# Patient Record
Sex: Female | Born: 1970
Health system: Southern US, Community
[De-identification: ages and names within clinical notes are randomized; demographics above are authoritative.]

## PROBLEM LIST (undated history)

## (undated) DIAGNOSIS — A159 Respiratory tuberculosis unspecified: Secondary | ICD-10-CM

## (undated) DIAGNOSIS — F32A Depression, unspecified: Secondary | ICD-10-CM

## (undated) DIAGNOSIS — J45909 Unspecified asthma, uncomplicated: Secondary | ICD-10-CM

## (undated) DIAGNOSIS — F419 Anxiety disorder, unspecified: Secondary | ICD-10-CM

## (undated) DIAGNOSIS — K589 Irritable bowel syndrome without diarrhea: Secondary | ICD-10-CM

## (undated) DIAGNOSIS — S83519A Sprain of anterior cruciate ligament of unspecified knee, initial encounter: Secondary | ICD-10-CM

## (undated) DIAGNOSIS — K760 Fatty (change of) liver, not elsewhere classified: Secondary | ICD-10-CM

## (undated) HISTORY — DX: Respiratory tuberculosis unspecified: A15.9

## (undated) HISTORY — DX: Unspecified asthma, uncomplicated: J45.909

## (undated) HISTORY — PX: COLONOSCOPY: SHX174

## (undated) HISTORY — PX: BREAST SURGERY: SHX581

## (undated) HISTORY — DX: Irritable bowel syndrome, unspecified: K58.9

## (undated) HISTORY — DX: Sprain of anterior cruciate ligament of unspecified knee, initial encounter: S83.519A

## (undated) HISTORY — DX: Depression, unspecified: F32.A

## (undated) HISTORY — PX: OTHER SURGICAL HISTORY: SHX169

## (undated) HISTORY — DX: Fatty (change of) liver, not elsewhere classified: K76.0

---
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
## 2011-06-06 ENCOUNTER — Encounter: Payer: Self-pay | Admitting: Obstetrics & Gynecology

## 2011-06-26 ENCOUNTER — Encounter: Payer: Self-pay | Admitting: Obstetrics & Gynecology

## 2011-06-26 ENCOUNTER — Ambulatory Visit (INDEPENDENT_AMBULATORY_CARE_PROVIDER_SITE_OTHER): Payer: Managed Care, Other (non HMO) | Admitting: Obstetrics & Gynecology

## 2011-06-26 VITALS — BP 103/61 | HR 73 | Temp 98.1°F | Ht 67.5 in | Wt 164.0 lb

## 2011-06-26 DIAGNOSIS — Z0000 Encounter for general adult medical examination without abnormal findings: Secondary | ICD-10-CM

## 2011-06-26 DIAGNOSIS — Z1231 Encounter for screening mammogram for malignant neoplasm of breast: Secondary | ICD-10-CM

## 2011-06-26 DIAGNOSIS — Z113 Encounter for screening for infections with a predominantly sexual mode of transmission: Secondary | ICD-10-CM

## 2011-06-26 DIAGNOSIS — Z1272 Encounter for screening for malignant neoplasm of vagina: Secondary | ICD-10-CM
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
## 2011-06-27 LAB — WET PREP, GENITAL
Trich, Wet Prep: NONE SEEN
Yeast Wet Prep HPF POC: NONE SEEN

## 2011-06-28 ENCOUNTER — Telehealth: Payer: Self-pay | Admitting: *Deleted

## 2011-06-28 DIAGNOSIS — B9689 Other specified bacterial agents as the cause of diseases classified elsewhere: Secondary | ICD-10-CM

## 2011-06-28 DIAGNOSIS — N760 Acute vaginitis: Secondary | ICD-10-CM

## 2011-06-28 MED ORDER — METRONIDAZOLE 500 MG PO TABS: 500.0000 mg | ORAL_TABLET | Freq: Two times a day (BID) | ORAL | Status: AC

## 2011-06-28 NOTE — Telephone Encounter (Signed)
Pt is positive for clue cells on Wet prep and RX for Flagyl 500mg  BID x 7 days sent to [facility] in [place].

## 2011-07-23 ENCOUNTER — Encounter: Payer: Self-pay | Admitting: *Deleted

## 2011-08-03 ENCOUNTER — Encounter: Payer: Self-pay | Admitting: Family Medicine

## 2011-08-03 ENCOUNTER — Ambulatory Visit (INDEPENDENT_AMBULATORY_CARE_PROVIDER_SITE_OTHER): Payer: Managed Care, Other (non HMO) | Admitting: Family Medicine

## 2011-08-03 VITALS — BP 98/58 | HR 58 | Ht 66.0 in | Wt 161.0 lb

## 2011-08-03 DIAGNOSIS — R1011 Right upper quadrant pain: Secondary | ICD-10-CM

## 2011-08-03 DIAGNOSIS — K529 Noninfective gastroenteritis and colitis, unspecified: Secondary | ICD-10-CM

## 2011-08-03 DIAGNOSIS — K5900 Constipation, unspecified: Secondary | ICD-10-CM

## 2011-08-03 DIAGNOSIS — K649 Unspecified hemorrhoids: Secondary | ICD-10-CM | POA: Insufficient documentation

## 2011-08-03 DIAGNOSIS — K5289 Other specified noninfective gastroenteritis and colitis: Secondary | ICD-10-CM

## 2011-08-03 NOTE — Patient Instructions (Signed)
We will call you with your lab results. If you don't here from us in about a week then please give us a call at [phone].  
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
## 2011-08-07 ENCOUNTER — Telehealth: Payer: Self-pay | Admitting: *Deleted

## 2011-08-07 DIAGNOSIS — R1011 Right upper quadrant pain: Secondary | ICD-10-CM

## 2011-08-07 NOTE — Telephone Encounter (Signed)
New Rad order entered

## 2011-08-09 LAB — COMPLETE METABOLIC PANEL WITH GFR
Albumin: 4 g/dL (ref 3.5–5.2)
Alkaline Phosphatase: 33 U/L — ABNORMAL LOW (ref 39–117)
BUN: 9 mg/dL (ref 6–23)
GFR, Est Non African American: >89 mL/min
Glucose, Bld: 93 mg/dL (ref 70–99)
Potassium: 4.1 mEq/L (ref 3.5–5.3)
Total Bilirubin: 0.4 mg/dL (ref 0.3–1.2)

## 2011-08-09 LAB — LIPID PANEL
Cholesterol: 143 mg/dL (ref 0–200)
Total CHOL/HDL Ratio: 3.3 Ratio

## 2011-08-09 LAB — CBC
HCT: 41.8 % (ref 36.0–46.0)
Hemoglobin: 13.4 g/dL (ref 12.0–15.0)
MCHC: 32.1 g/dL (ref 30.0–36.0)
RBC: 4.44 MIL/uL (ref 3.87–5.11)
WBC: 4.6 10*3/uL (ref 4.0–10.5)

## 2011-08-09 NOTE — Progress Notes (Signed)
Quick Note:  All labs are normal. ______ 

## 2011-08-13 ENCOUNTER — Ambulatory Visit: Payer: Managed Care, Other (non HMO)

## 2011-08-15 ENCOUNTER — Ambulatory Visit
Admission: RE | Admit: 2011-08-15 | Discharge: 2011-08-15 | Disposition: A | Discharge status: Home / Self Care | Payer: Managed Care, Other (non HMO) | Source: Ambulatory Visit | Attending: Family Medicine | Admitting: Family Medicine

## 2011-08-15 DIAGNOSIS — R1011 Right upper quadrant pain: Secondary | ICD-10-CM

## 2011-08-16 ENCOUNTER — Ambulatory Visit: Payer: Managed Care, Other (non HMO) | Admitting: Family Medicine

## 2011-08-20 ENCOUNTER — Encounter: Payer: Self-pay | Admitting: Advanced Practice Midwife

## 2011-08-20 ENCOUNTER — Ambulatory Visit (INDEPENDENT_AMBULATORY_CARE_PROVIDER_SITE_OTHER): Payer: Managed Care, Other (non HMO) | Admitting: Advanced Practice Midwife

## 2011-08-20 DIAGNOSIS — N760 Acute vaginitis: Secondary | ICD-10-CM

## 2011-08-20 DIAGNOSIS — N898 Other specified noninflammatory disorders of vagina: Secondary | ICD-10-CM

## 2011-08-20 DIAGNOSIS — B9689 Other specified bacterial agents as the cause of diseases classified elsewhere: Secondary | ICD-10-CM

## 2011-08-20 DIAGNOSIS — N23 Unspecified renal colic: Secondary | ICD-10-CM

## 2011-08-20 DIAGNOSIS — R309 Painful micturition, unspecified: Secondary | ICD-10-CM

## 2011-08-20 DIAGNOSIS — A499 Bacterial infection, unspecified: Secondary | ICD-10-CM

## 2011-08-20 LAB — POCT URINALYSIS DIPSTICK
Leukocytes, UA: NEGATIVE
Nitrite, UA: NEGATIVE
Urobilinogen, UA: NEGATIVE
pH, UA: 7

## 2011-08-20 NOTE — Patient Instructions (Signed)
Report if you are having worsening discharge, odor, itching, burning or pain. We will let you know about your results.

## 2011-08-20 NOTE — Progress Notes (Signed)
  Subjective:    Patient ID: Joanna Baker, female    DOB: 10-Apr-1971, 41 y.o.   MRN: [id number]  HPI Vaginal discharge x2 weeks, no odor,itching,burning - treated 12/13 for BV with Flagyl, about 1 week after treatment had yeast, improved with Monistat 7, then started period. Discharge returned shortly after menses. + mild dysuria - trying hydration/cranberry juice.     Review of Systems  Constitutional: Negative for fever, chills and fatigue.  Respiratory: Negative.   Cardiovascular: Negative.   Gastrointestinal: Negative for nausea, vomiting, abdominal pain, diarrhea and constipation.  Genitourinary: Positive for dysuria and vaginal discharge. Negative for urgency, frequency, hematuria, flank pain, vaginal bleeding, difficulty urinating, genital sores, vaginal pain, menstrual problem, pelvic pain and dyspareunia.  Musculoskeletal: Negative.   Neurological: Negative.   Psychiatric/Behavioral: Negative.        Objective:   Physical Exam  Nursing note and vitals reviewed. Constitutional: She is oriented to person, place, and time. She appears well-developed and well-nourished. No distress.  Cardiovascular: Normal rate.   Pulmonary/Chest: Effort normal.  Abdominal: Soft. There is no tenderness.  Genitourinary: There is no rash, tenderness or lesion on the right labia. There is no rash, tenderness or lesion on the left labia. Cervix exhibits no discharge and no friability. No erythema or bleeding around the vagina. Vaginal discharge (small, white, nonmalodorous) found.  Musculoskeletal: Normal range of motion.  Neurological: She is alert and oriented to person, place, and time.  Skin: Skin is warm and dry.  Psychiatric: She has a normal mood and affect.          Assessment & Plan:  41 y.o. female with vaginal discharge Wet prep collected - no evidence of infection on exam, will treat per wet prep Rev'd normal vaginal discharge vs. Symptoms of infection F/U PRN

## 2011-08-21 ENCOUNTER — Ambulatory Visit
Admission: RE | Admit: 2011-08-21 | Discharge: 2011-08-21 | Disposition: A | Discharge status: Home / Self Care | Payer: Managed Care, Other (non HMO) | Source: Ambulatory Visit | Attending: Obstetrics & Gynecology | Admitting: Obstetrics & Gynecology

## 2011-08-21 DIAGNOSIS — Z1231 Encounter for screening mammogram for malignant neoplasm of breast: Secondary | ICD-10-CM

## 2011-08-21 LAB — WET PREP, GENITAL: Trich, Wet Prep: NONE SEEN

## 2011-08-21 MED ORDER — METRONIDAZOLE 500 MG PO TABS: 500.0000 mg | ORAL_TABLET | Freq: Two times a day (BID) | ORAL | Status: AC

## 2011-08-21 NOTE — Progress Notes (Signed)
Addended by: Archie Patten on: 08/21/2011 09:51 PM   Modules accepted: Orders

## 2011-08-22 ENCOUNTER — Telehealth: Payer: Self-pay | Admitting: *Deleted

## 2011-08-22 NOTE — Telephone Encounter (Signed)
Pt notified of Positive clue cells and that RX was called into [facility] in [place].

## 2011-08-29 ENCOUNTER — Other Ambulatory Visit: Payer: Self-pay | Admitting: Obstetrics & Gynecology

## 2011-08-29 DIAGNOSIS — R928 Other abnormal and inconclusive findings on diagnostic imaging of breast: Secondary | ICD-10-CM

## 2011-09-05 ENCOUNTER — Telehealth: Payer: Self-pay | Admitting: *Deleted

## 2011-09-05 NOTE — Telephone Encounter (Signed)
Pt called to ask questions about ovulation and mittelschmerz.  She states that she is mid cycle and feeling like something cold inside her RT pelvis area.  She was wondering if it was ovulation.  She is noticing the thin cervial mucous.  She does deny any other pain or discomfort.  If she should start to have any pain that does not go away with her period she will call office for appt as this could be an ovarian cyst.

## 2011-09-14 ENCOUNTER — Ambulatory Visit
Admission: RE | Admit: 2011-09-14 | Discharge: 2011-09-14 | Disposition: A | Discharge status: Home / Self Care | Payer: Managed Care, Other (non HMO) | Source: Ambulatory Visit | Attending: Obstetrics & Gynecology | Admitting: Obstetrics & Gynecology

## 2011-09-14 DIAGNOSIS — R928 Other abnormal and inconclusive findings on diagnostic imaging of breast: Secondary | ICD-10-CM

## 2011-11-07 ENCOUNTER — Ambulatory Visit: Payer: Managed Care, Other (non HMO) | Admitting: Family Medicine

## 2011-12-06 ENCOUNTER — Telehealth: Payer: Self-pay

## 2011-12-18 ENCOUNTER — Encounter: Payer: Self-pay | Admitting: Obstetrics & Gynecology

## 2011-12-18 ENCOUNTER — Ambulatory Visit (INDEPENDENT_AMBULATORY_CARE_PROVIDER_SITE_OTHER): Payer: Managed Care, Other (non HMO) | Admitting: Obstetrics & Gynecology

## 2011-12-18 VITALS — BP 97/61 | HR 89 | Temp 98.5°F | Resp 16 | Ht 67.0 in | Wt 159.0 lb

## 2011-12-18 DIAGNOSIS — N920 Excessive and frequent menstruation with regular cycle: Secondary | ICD-10-CM

## 2011-12-18 DIAGNOSIS — N921 Excessive and frequent menstruation with irregular cycle: Secondary | ICD-10-CM

## 2011-12-18 NOTE — Progress Notes (Signed)
  Subjective:    Patient ID: Joanna Baker, female    DOB: [birth year], 41 y.o.   MRN: [id number]  HPI  Joanna Baker is here because her cycles have shortened this year from 26 days to 20 days with heavy periods and many mood swings.  Review of Systems     Objective:   Physical Exam        Assessment & Plan:  Menometrorrhagia- check TSH.  If TSH is normal, we have discussed treatment options- watchful waiting, Mirena, OCPS, endometrial ablation.

## 2011-12-19 LAB — TSH: TSH: 1.155 u[IU]/mL (ref 0.350–4.500)

## 2012-03-18 NOTE — Telephone Encounter (Signed)
Lora called patient.

## 2012-05-09 ENCOUNTER — Other Ambulatory Visit: Payer: Self-pay | Admitting: Obstetrics & Gynecology

## 2012-05-09 DIAGNOSIS — R921 Mammographic calcification found on diagnostic imaging of breast: Secondary | ICD-10-CM

## 2012-05-19 ENCOUNTER — Ambulatory Visit
Admission: RE | Admit: 2012-05-19 | Discharge: 2012-05-19 | Disposition: A | Discharge status: Home / Self Care | Payer: Managed Care, Other (non HMO) | Source: Ambulatory Visit | Attending: Obstetrics & Gynecology | Admitting: Obstetrics & Gynecology

## 2012-05-19 DIAGNOSIS — R921 Mammographic calcification found on diagnostic imaging of breast: Secondary | ICD-10-CM

## 2012-06-03 ENCOUNTER — Encounter: Payer: Self-pay | Admitting: Family Medicine

## 2012-06-03 ENCOUNTER — Ambulatory Visit (INDEPENDENT_AMBULATORY_CARE_PROVIDER_SITE_OTHER): Payer: Managed Care, Other (non HMO) | Admitting: Family Medicine

## 2012-06-03 VITALS — BP 107/64 | HR 76 | Temp 98.2°F | Resp 12 | Wt 159.0 lb

## 2012-06-03 DIAGNOSIS — J209 Acute bronchitis, unspecified: Secondary | ICD-10-CM

## 2012-06-03 MED ORDER — DOXYCYCLINE HYCLATE 100 MG PO TABS: ORAL_TABLET | ORAL | Status: AC

## 2012-06-03 NOTE — Progress Notes (Signed)
CC: Joanna Baker is a 41 y.o. female is here for Cough   Subjective: HPI:  Patient reports 3 days productive cough, brown sputum without blood nor rest color, facial pressure below the eyes, and fatigue. Seems to be getting worse in a daily basis. Interventions include Mucinex without much improvement, nothing particular seems to make it worse. She denies fevers, chills, shortness of breath, wheezing, chest pain, back pain, abdominal pain, nor orthopnea. She is a past medical history of a positive PPD requiring isoniazid, she denies unintentional weight loss nor night sweats recently nor remotely. She is a current smoker   Review Of Systems Outlined In HPI  Past Medical History  Diagnosis Date  . IBS (irritable bowel syndrome)   . TB (tuberculosis), treated     6 month of INH at age 55  . ACL injury tear     Right knee, not repaired.    . Fatty liver      Family History  Problem Relation Age of Onset  . Pulmonary embolism Mother   . Stroke Mother 34  . Alcohol abuse Father   . Depression Paternal Grandfather   . Heart attack Maternal Grandmother      History  Substance Use Topics  . Smoking status: Current Every Day Smoker -- 0.2 packs/day for 5 years    Types: Cigarettes  . Smokeless tobacco: Never Used  . Alcohol Use: 1.2 oz/week    2 Cans of beer per week     Objective: Filed Vitals:   06/03/12 1353  BP: 107/64  Pulse: 76  Temp: 98.2 F (36.8 C)  Resp: 12    General: Alert and Oriented, No Acute Distress HEENT: Pupils equal, round, reactive to light. Conjunctivae clear.  External ears unremarkable, canals clear with intact TMs with appropriate landmarks.  Middle ear appears to have a mild serous effusion. Pink inferior turbinates.  Moist mucous membranes, pharynx without inflammation nor lesions.  Neck supple without palpable lymphadenopathy nor abnormal masses. Lungs: Good air movement, mild central rhonchi without wheezing nor rails nor signs of consolidation    Cardiac: Regular rate and rhythm. Normal S1/S2.  No murmurs, rubs, nor gallops.    Assessment & Plan: Joanna Baker was seen today for cough.  Diagnoses and associated orders for this visit:  Acute bronchitis - doxycycline (VIBRA-TABS) 100 MG tablet; One by mouth twice a day for ten days.   Given her smoking status I like her to start doxycycline and continue Mucinex products with fluids and nasal saline washes. Encouraged her to stop smoking for her general health.Signs and symptoms requring emergent/urgent reevaluation were discussed with the patient.  Return if symptoms worsen or fail to improve.

## 2012-07-25 ENCOUNTER — Emergency Department (HOSPITAL_COMMUNITY)
Admission: EM | Admit: 2012-07-25 | Discharge: 2012-07-25 | Disposition: A | Discharge status: Home / Self Care | Payer: Managed Care, Other (non HMO) | Attending: Emergency Medicine | Admitting: Emergency Medicine

## 2012-07-25 ENCOUNTER — Encounter (HOSPITAL_COMMUNITY): Payer: Self-pay | Admitting: Family Medicine

## 2012-07-25 DIAGNOSIS — F17200 Nicotine dependence, unspecified, uncomplicated: Secondary | ICD-10-CM | POA: Insufficient documentation

## 2012-07-25 DIAGNOSIS — T50Z95A Adverse effect of other vaccines and biological substances, initial encounter: Secondary | ICD-10-CM | POA: Insufficient documentation

## 2012-07-25 DIAGNOSIS — Z9089 Acquired absence of other organs: Secondary | ICD-10-CM | POA: Insufficient documentation

## 2012-07-25 DIAGNOSIS — R0602 Shortness of breath: Secondary | ICD-10-CM | POA: Insufficient documentation

## 2012-07-25 DIAGNOSIS — T8052XA Anaphylactic reaction due to vaccination, initial encounter: Secondary | ICD-10-CM | POA: Insufficient documentation

## 2012-07-25 DIAGNOSIS — Z8739 Personal history of other diseases of the musculoskeletal system and connective tissue: Secondary | ICD-10-CM | POA: Insufficient documentation

## 2012-07-25 DIAGNOSIS — Z8611 Personal history of tuberculosis: Secondary | ICD-10-CM | POA: Insufficient documentation

## 2012-07-25 DIAGNOSIS — Z8719 Personal history of other diseases of the digestive system: Secondary | ICD-10-CM | POA: Insufficient documentation

## 2012-07-25 MED ORDER — SODIUM CHLORIDE 0.9 % IV BOLUS (SEPSIS): 1000.0000 mL | Freq: Once | INTRAVENOUS | Status: DC

## 2012-07-25 MED ORDER — PREDNISONE 20 MG PO TABS
60.0000 mg | ORAL_TABLET | Freq: Once | ORAL | Status: AC
  Administered 2012-07-25: 60 mg via ORAL
  Filled 2012-07-25: qty 3

## 2012-07-25 MED ORDER — DIPHENHYDRAMINE HCL 25 MG PO CAPS: 25.0000 mg | ORAL_CAPSULE | Freq: Once | ORAL | Status: DC

## 2012-07-25 MED ORDER — FAMOTIDINE 20 MG PO TABS
20.0000 mg | ORAL_TABLET | Freq: Once | ORAL | Status: AC
  Administered 2012-07-25: 20 mg via ORAL
  Filled 2012-07-25: qty 1
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
## 2012-07-25 NOTE — ED Notes (Signed)
Got a flu shot today, roommate had flu like symptoms today. States was driving today and had an "anaphylactic type rxn and had to push it to get back to town. Also, my genitals felt very sensitive, today while driving." states had an anaphylactic rxn to iodine based supplement a few years ago,

## 2012-07-25 NOTE — ED Notes (Addendum)
Per pt she had a flu shot this morning and then this afternoon about 3 pm she started having an allergic reaction. sts she is SOB, no distress at triage. Pt sat down on the floor while she was checking in. sts she drove herself here. sts her mother is allergic to the flu shot. sts she feels like she is going to pass out. No facial swelling, throat swelling or rash noted. sts also her genital are highly sensitive.

## 2012-07-27 NOTE — ED Provider Notes (Signed)
Medical screening examination/treatment/procedure(s) were performed by non-physician practitioner and as supervising physician I was immediately available for consultation/collaboration.   Suzi Roots, MD 07/27/12 1524

## 2012-08-01 ENCOUNTER — Ambulatory Visit (INDEPENDENT_AMBULATORY_CARE_PROVIDER_SITE_OTHER): Payer: Managed Care, Other (non HMO) | Admitting: Family Medicine

## 2012-08-01 ENCOUNTER — Encounter: Payer: Self-pay | Admitting: Family Medicine

## 2012-08-01 VITALS — BP 107/57 | HR 86 | Resp 18 | Wt 161.0 lb

## 2012-08-01 DIAGNOSIS — T50995A Adverse effect of other drugs, medicaments and biological substances, initial encounter: Secondary | ICD-10-CM

## 2012-08-01 DIAGNOSIS — M25562 Pain in left knee: Secondary | ICD-10-CM

## 2012-08-01 DIAGNOSIS — T50A95A Adverse effect of other bacterial vaccines, initial encounter: Secondary | ICD-10-CM

## 2012-08-01 DIAGNOSIS — K589 Irritable bowel syndrome without diarrhea: Secondary | ICD-10-CM

## 2012-08-01 DIAGNOSIS — T50B95A Adverse effect of other viral vaccines, initial encounter: Secondary | ICD-10-CM

## 2012-08-01 DIAGNOSIS — M25569 Pain in unspecified knee: Secondary | ICD-10-CM

## 2012-08-01 DIAGNOSIS — K5909 Other constipation: Secondary | ICD-10-CM

## 2012-08-01 NOTE — Progress Notes (Signed)
Subjective:    Patient ID: Joanna Baker, female    DOB: 1971-06-13, 42 y.o.   MRN: [id number]  HPI  Had a  FLU shot and had an anaphylactic reaction. Got flu shot around 10AM. Then aroud 330PM got SOB, palpitations, wheezing, and extreme fatigue. Pulled off at rest stop.  Then drove herself to [place]. Had only had one flu shot prior to this.  Send home with steroid and benadryl.  Felt better into the next day.    Dx with IBS 2 years ago with constipation predominant.  Says when ate cheese she would feel like her bowels in her RIght lower quadrant would explode. Then causes the tissue adn muscle in her right abd to hurt.  Has been off cheese and dairy for sometime. Says did have something with cream in it about a week ago and that cuased problems.  She has had 4 BMs in the last 2 days.  Using fiber once a day.    She does notice she feels worse when she eats things like pizza which are heavy with gluten. She definitely feels she is lactose intolerant and has felt much better since she has cut that out of her diet.  Left knee pain x 8 weeks she says she fell on it at work. She landed directly on her knee. She said there was some mild swelling initially but has not been swelling since then. She says she's unable to kneel and put direct pressure on the knee but otherwise denies any giving out, locking cracking or popping. She has a history of anterior cruciate ligament repair on her right knee. Review of Systems     Objective:   Physical Exam  Constitutional: She is oriented to person, place, and time. She appears well-developed and well-nourished.  HENT:  Head: Normocephalic and atraumatic.  Cardiovascular: Normal rate, regular rhythm and normal heart sounds.   Pulmonary/Chest: Effort normal and breath sounds normal.  Musculoskeletal:       Left knee with no swelling. Normal range of motion. No crepitus. Nontender along the joint lines. She is mildly tender over the right upper anterior tibial  plateau. No actual swelling or palpable knot or lesion. Negative McMurray's. Normal anterior drawer test  Neurological: She is alert and oriented to person, place, and time.  Skin: Skin is warm and dry.  Psychiatric: She has a normal mood and affect. Her behavior is normal.          Assessment & Plan:  Reaction to the flu vaccine. This was added to her allergy list. I do not think she needs any other testing. She has no prior history of allergies and in fact he takes without any difficulty.  Irritable bowel syndrome, constipation predominant-next. We discussed options. Currently she is taking a fiber pill daily. She may need to increase this or consider switching to MiraLax. I also discussed that there are 2 new medications on the market, Amitiza and Linzess, both of which have had great success with so far. She asked that I write a names of these products sounds that she can investigate them further. We also discussed that we could do some gluten intolerance testing. The blood test can be helpful but is not the most sensitive test. She wants to check on the pricing first before she has it done. We also discussed that she could certainly on her own try a gluten-free diet for the next month and see if it improves her symptoms.  Left knee pain-most likely  a bone contusion. No evidence of fracture on exam today but since her pain has persisted for 8 weeks we will go ahead and get an x-ray today. Hopefully it will be normal. Call with results once they're available.

## 2012-08-01 NOTE — Patient Instructions (Addendum)
Recommend start miralax once a day  Call if your pain is not better Consider Linzess an Amitiza.   Can call to see how much a celiac panel.

## 2012-08-03 ENCOUNTER — Encounter: Payer: Self-pay | Admitting: Family Medicine

## 2012-08-22 ENCOUNTER — Ambulatory Visit (HOSPITAL_BASED_OUTPATIENT_CLINIC_OR_DEPARTMENT_OTHER)
Admission: RE | Admit: 2012-08-22 | Discharge: 2012-08-22 | Disposition: A | Discharge status: Home / Self Care | Payer: Managed Care, Other (non HMO) | Source: Ambulatory Visit | Attending: Family Medicine | Admitting: Family Medicine

## 2012-08-22 DIAGNOSIS — M25569 Pain in unspecified knee: Secondary | ICD-10-CM | POA: Insufficient documentation

## 2012-08-22 DIAGNOSIS — M25562 Pain in left knee: Secondary | ICD-10-CM

## 2012-10-21 ENCOUNTER — Ambulatory Visit (INDEPENDENT_AMBULATORY_CARE_PROVIDER_SITE_OTHER): Payer: Managed Care, Other (non HMO) | Admitting: Obstetrics & Gynecology

## 2012-10-21 ENCOUNTER — Other Ambulatory Visit: Payer: Self-pay | Admitting: Obstetrics & Gynecology

## 2012-10-21 ENCOUNTER — Encounter: Payer: Self-pay | Admitting: Obstetrics & Gynecology

## 2012-10-21 VITALS — BP 105/65 | HR 84 | Temp 97.8°F | Resp 22

## 2012-10-21 DIAGNOSIS — N943 Premenstrual tension syndrome: Secondary | ICD-10-CM

## 2012-10-21 DIAGNOSIS — R920 Mammographic microcalcification found on diagnostic imaging of breast: Secondary | ICD-10-CM

## 2012-10-21 MED ORDER — FLUOXETINE HCL 20 MG PO TABS: 20.0000 mg | ORAL_TABLET | Freq: Every day | ORAL | Status: DC

## 2012-10-21 NOTE — Progress Notes (Signed)
  Subjective:    Patient ID: Joanna Baker, female    DOB: Jul 05, 1971, 42 y.o.   MRN: [id number]  HPI  42 yo SW G0 who is here to discuss her PMS type symptoms. She describes intense anger, especially at work. She denies HI and SI. She tells me that she was given a diagnosis of bipolar at age 42 and took lithium at that time but no psych meds in 20 years. She sees a relationship counselor with her girlfriend but does not see a psychiatrist for many years. She reports that about 3 days after her period ends she will have about a week of a decrease level of anger but that it starts to increase about a week before her period. She took Chantix in the past and reports depression with SI.   Review of Systems  Her mammogram and period are due.    Objective:   Physical Exam  Tearful young lady I have deferred her exam today because she is on her period      Assessment & Plan:  PMS versus other psychiatric diagnosis- I have offered her a trial of SSRI versus immediate referral to a psychiatrist. She wants to try the SSRI. Per her request, I am ordering hormone levels. Preventative care- RTC 3 weeks for annual and have mammogram in the meantime.

## 2012-10-22 LAB — TESTOSTERONE, FREE, TOTAL, SHBG: Testosterone, Free: 4.6 pg/mL (ref 0.6–6.8)

## 2012-11-06 ENCOUNTER — Ambulatory Visit (INDEPENDENT_AMBULATORY_CARE_PROVIDER_SITE_OTHER): Payer: Medicare HMO | Admitting: Obstetrics & Gynecology

## 2012-11-06 ENCOUNTER — Ambulatory Visit
Admission: RE | Admit: 2012-11-06 | Discharge: 2012-11-06 | Disposition: A | Discharge status: Home / Self Care | Payer: Medicare HMO | Source: Ambulatory Visit | Attending: Obstetrics & Gynecology | Admitting: Obstetrics & Gynecology

## 2012-11-06 ENCOUNTER — Encounter: Payer: Self-pay | Admitting: Obstetrics & Gynecology

## 2012-11-06 VITALS — BP 97/60 | HR 84 | Resp 14 | Ht 67.0 in | Wt 158.0 lb

## 2012-11-06 DIAGNOSIS — Z1151 Encounter for screening for human papillomavirus (HPV): Secondary | ICD-10-CM

## 2012-11-06 DIAGNOSIS — Z01419 Encounter for gynecological examination (general) (routine) without abnormal findings: Secondary | ICD-10-CM

## 2012-11-06 DIAGNOSIS — Z0000 Encounter for general adult medical examination without abnormal findings: Secondary | ICD-10-CM

## 2012-11-06 DIAGNOSIS — Z124 Encounter for screening for malignant neoplasm of cervix: Secondary | ICD-10-CM

## 2012-11-06 DIAGNOSIS — R920 Mammographic microcalcification found on diagnostic imaging of breast: Secondary | ICD-10-CM

## 2012-11-06 NOTE — Progress Notes (Signed)
Subjective:    Joanna Baker is a 42 y.o. female who presents for an annual exam. The patient has no complaints today. She has been taking only 5 mg of the fluoxatine and has noticed a small improvement in her anger level. She wants to continue to try this treatment for now. She knows that if this is not a successful treatment, then I will refer her to a psychiatrist.. The patient is sexually active. (female partner) GYN screening history: last pap: was normal. The patient wears seatbelts: yes. The patient participates in regular exercise: yes. Has the patient ever been transfused or tattooed?: yes. The patient reports that there is not domestic violence in her life.   Menstrual History: OB History   Grav Para Term Preterm Abortions TAB SAB Ect Mult Living                  Menarche age: 85  Patient's last menstrual period was 10/17/2012.    The following portions of the patient's history were reviewed and updated as appropriate: allergies, current medications, past family history, past medical history, past social history, past surgical history and problem list.  Review of Systems A comprehensive review of systems was negative.  Her mammogram this month was normal. Her lab work this month was normal.   Objective:    BP 97/60  Pulse 84  Resp 14  Ht 5\' 7"  (1.702 m)  Wt 158 lb (71.668 kg)  BMI 24.74 kg/m2  LMP 10/17/2012  General Appearance:    Alert, cooperative, no distress, appears stated age  Head:    Normocephalic, without obvious abnormality, atraumatic  Eyes:    PERRL, conjunctiva/corneas clear, EOM's intact, fundi    benign, both eyes  Ears:    Normal TM's and external ear canals, both ears  Nose:   Nares normal, septum midline, mucosa normal, no drainage    or sinus tenderness  Throat:   Lips, mucosa, and tongue normal; teeth and gums normal  Neck:   Supple, symmetrical, trachea midline, no adenopathy;    thyroid:  no enlargement/tenderness/nodules; no carotid   bruit or  JVD  Back:     Symmetric, no curvature, ROM normal, no CVA tenderness  Lungs:     Clear to auscultation bilaterally, respirations unlabored  Chest Wall:    No tenderness or deformity   Heart:    Regular rate and rhythm, S1 and S2 normal, no murmur, rub   or gallop  Breast Exam:    No tenderness, masses, or nipple abnormality  Abdomen:     Soft, non-tender, bowel sounds active all four quadrants,    no masses, no organomegaly  Genitalia:    Normal female without lesion, discharge or tenderness, NSSA, NT, mobile, normal adnexal exam     Extremities:   Extremities normal, atraumatic, no cyanosis or edema  Pulses:   2+ and symmetric all extremities  Skin:   Skin color, texture, turgor normal, no rashes or lesions  Lymph nodes:   Cervical, supraclavicular, and axillary nodes normal  Neurologic:   CNII-XII intact, normal strength, sensation and reflexes    throughout  .    Assessment:    Healthy female exam.    Plan:     Thin prep Pap smear.  RTC 4 months to follow up on her depression/anger

## 2012-12-01 ENCOUNTER — Ambulatory Visit (INDEPENDENT_AMBULATORY_CARE_PROVIDER_SITE_OTHER): Payer: Medicare HMO | Admitting: Family Medicine

## 2012-12-01 ENCOUNTER — Ambulatory Visit (INDEPENDENT_AMBULATORY_CARE_PROVIDER_SITE_OTHER): Payer: Medicare HMO

## 2012-12-01 ENCOUNTER — Encounter: Payer: Self-pay | Admitting: Family Medicine

## 2012-12-01 VITALS — BP 105/68 | HR 88 | Wt 166.0 lb

## 2012-12-01 DIAGNOSIS — R0789 Other chest pain: Secondary | ICD-10-CM

## 2012-12-01 DIAGNOSIS — R059 Cough, unspecified: Secondary | ICD-10-CM

## 2012-12-01 DIAGNOSIS — R05 Cough: Secondary | ICD-10-CM

## 2012-12-01 NOTE — Progress Notes (Signed)
  Subjective:    Patient ID: Joanna Baker, female    DOB: 28-Aug-1970, 42 y.o.   MRN: [id number]  HPI Some mild chest dicomfort with green sputum and cough x 2 weeks. Worse in the AM and then feels ok during the day. More dry cough during the day.  Stayed at a friends house who had a down comforter.  Still smoking some. She is using e-cig.  Has been more SOB.  Last chest Xray was  5 years. Had some wheezing 2 weeks ago.  No asthma.  Ears have been itchey. Tried some plain sudafed.  No facial pain or pressure.    She's also had some ongoing bowel problems and has really increased her fiber intake it has helped. She's also had some ongoing low back and thoracic spine pain.  Review of Systems     Objective:   Physical Exam  Constitutional: She is oriented to person, place, and time. She appears well-developed and well-nourished.  HENT:  Head: Normocephalic and atraumatic.  Right Ear: External ear normal.  Left Ear: External ear normal.  Nose: Nose normal.  Mouth/Throat: Oropharynx is clear and moist.  TMs and canals are clear.   Eyes: Conjunctivae and EOM are normal. Pupils are equal, round, and reactive to light.  Neck: Neck supple. No thyromegaly present.  Cardiovascular: Normal rate, regular rhythm and normal heart sounds.   Pulmonary/Chest: Effort normal and breath sounds normal. She has no wheezes.  Lymphadenopathy:    She has no cervical adenopathy.  Neurological: She is alert and oriented to person, place, and time.  Skin: Skin is warm and dry.  Psychiatric: She has a normal mood and affect.          Assessment & Plan:  Cough - I really think that her cough is probably allergy related. His been going on for 2 weeks locally the: Has been very high here. She also noticed it got worse when she slept in a bed that had a down comforter. I would like her to try an over-the-counter Zyrtec or Allegra. She think she has some Zyrtec at home and will try that. Encouraged her to use it at  bedtime. Because of the chest tightness I would also like to get a chest x-ray. No prior history of asthma. We'll call with results once available.

## 2013-01-01 ENCOUNTER — Telehealth: Payer: Self-pay | Admitting: *Deleted

## 2013-01-01 NOTE — Telephone Encounter (Signed)
Pt calls and states that she got bit by a tick around June 2 and area when pulled tick out was red, swollen and rash- put neosporin on it and didn't really think much about it but now she is feeling achy, some headache, area is itching where tick was.  No c/o fever, swelling or chills.  Suggested to pt since has been over 14 days since bite then needs to be seen. Sent to schedule appt. Barry Dienes, LPN

## 2013-01-02 ENCOUNTER — Ambulatory Visit (INDEPENDENT_AMBULATORY_CARE_PROVIDER_SITE_OTHER): Payer: Medicare HMO

## 2013-01-02 ENCOUNTER — Ambulatory Visit (INDEPENDENT_AMBULATORY_CARE_PROVIDER_SITE_OTHER): Payer: Medicare HMO | Admitting: Family Medicine

## 2013-01-02 ENCOUNTER — Encounter: Payer: Self-pay | Admitting: Family Medicine

## 2013-01-02 VITALS — BP 108/72 | HR 75 | Temp 98.2°F | Wt 166.0 lb

## 2013-01-02 DIAGNOSIS — M545 Low back pain, unspecified: Secondary | ICD-10-CM

## 2013-01-02 DIAGNOSIS — M5137 Other intervertebral disc degeneration, lumbosacral region: Secondary | ICD-10-CM

## 2013-01-02 DIAGNOSIS — A6920 Lyme disease, unspecified: Secondary | ICD-10-CM

## 2013-01-02 MED ORDER — DOXYCYCLINE HYCLATE 100 MG PO TABS: ORAL_TABLET | ORAL | Status: AC

## 2013-01-02 NOTE — Progress Notes (Signed)
CC: Joanna Baker is a 42 y.o. female is here for Tick Removal and Back Pain   Subjective: HPI:   patient complains of a tick bite that occurred 2-1/2 weeks ago on the left lower abdomen. There was a round rash surrounding this region but was fluctuating in size for a week. She believes it did have a bull's-eye appearance, it is mildly tender without radiation of pain. It had purulence last week that was resolved with manual compression. Days after the bite she began to experience fatigue, generalized body aches, headache.  Symptoms have not gotten better or worsens onset they are overall mild to moderate in severity. She denies fevers, chills, nausea, motor or sensory disturbances, confusion  Patient complains of left low back pain that is moderate in severity without any radiation component. Has been present for one day. Came on without any inciting event. It is described as sharp on the back of the pelvis. It is worse first thing in the morning and improves throughout the day improves with stretching and hot water.  She denies weakness in the legs, bowel or bladder incontinence, subtle paresthesia.  She reports this pain comes and goes about 5 times a year for the past decade or more. Symptoms improved with salicylates   Review Of Systems Outlined In HPI  Past Medical History  Diagnosis Date  . IBS (irritable bowel syndrome)   . TB (tuberculosis), treated      6 month of INH at age 40  . ACL injury tear     Right knee, not repaired.    . Fatty liver      Family History  Problem Relation Age of Onset  . Pulmonary embolism Mother   . Stroke Mother 59  . Alcohol abuse Father   . Depression Paternal Grandfather   . Heart attack Maternal Grandmother      History  Substance Use Topics  . Smoking status: Current Every Day Smoker -- 0.25 packs/day for 5 years    Types: Cigarettes  . Smokeless tobacco: Never Used  . Alcohol Use: 1.2 oz/week    2 Cans of beer per week      Objective: Filed Vitals:   01/02/13 0913  BP: 108/72  Pulse: 75  Temp: 98.2 F (36.8 C)    General: Alert and Oriented, No Acute Distress HEENT: Pupils equal, round, reactive to light. Conjunctivae clear.  Moist mucous membranes Lungs: Clear to auscultation bilaterally, no wheezing/ronchi/rales.  Comfortable work of breathing. Good air movement. Cardiac: Regular rate and rhythm. Normal S1/S2.  No murmurs, rubs, nor gallops.   Abdomen: Soft nontender Extremities: No peripheral edema.  Strong peripheral pulses.  Right lower extremity negative straight leg raise, negative FADIR unble to perform FABER due to pain, full range of motion and strength throughout. L4 and S1 DTRs two over four bilateral and symmetric Back: Mild reproduction of pain with palpation of L5 spinous process along with right superior SI joint Mental Status: No depression, anxiety, nor agitation. Skin: Warm and dry. 3 mm papule without evidence of infection on the left lower abdomen no skin abnormalities or rashes elsewhere  Assessment & Plan: Joanna Baker was seen today for tick removal and back pain.  Diagnoses and associated orders for this visit:  Lyme disease - doxycycline (VIBRA-TABS) 100 MG tablet; One by mouth twice a day for ten days.  Low back pain - DG Lumbar Spine Complete; Future    Discussed with patient that description of rash and constellation of symptoms today are  suspicious for Lyme disease exposure therefore start doxycycline. Low back pain: Discussed with patient the sounds like SI joint arthritis  Offered x-ray of the SI joint to confirm to evaluate for candidacy for steroid injection, she declines this stating she is more concerned about lumbar fracture, therefore will obtain films to address her agenda today  Return in about 4 weeks (around 01/30/2013).

## 2013-01-13 ENCOUNTER — Encounter: Payer: Self-pay | Admitting: Sports Medicine

## 2013-01-13 ENCOUNTER — Ambulatory Visit (INDEPENDENT_AMBULATORY_CARE_PROVIDER_SITE_OTHER): Payer: Medicare HMO | Admitting: Sports Medicine

## 2013-01-13 VITALS — BP 99/55 | HR 66 | Wt 167.0 lb

## 2013-01-13 DIAGNOSIS — M5416 Radiculopathy, lumbar region: Secondary | ICD-10-CM | POA: Insufficient documentation

## 2013-01-13 DIAGNOSIS — IMO0002 Reserved for concepts with insufficient information to code with codable children: Secondary | ICD-10-CM

## 2013-01-13 MED ORDER — MELOXICAM 15 MG PO TABS: ORAL_TABLET | ORAL | Status: DC

## 2013-01-13 NOTE — Progress Notes (Signed)
   Subjective:    I'm seeing this patient as a consultation for:   Dr. Ivan Anchors  CC: Low back pain  HPI: This is a very pleasant 42 year old female with acute on chronic history of low back pain. She recalls falling onto her bottom in her 11s, she had persistent pain but x-rays were negative at that time. Unfortunately for the past few months she's had pain she localizes bilaterally in her lower lumbar spine, radiating down her right leg, down the posterior aspect of her right thigh, posterior aspect of her right lower leg, but not to the foot. Is worse with Valsalva, and worse with flexion, and sitting in a car for long periods of time. She denies constitutional symptoms, she denies bowel or bladder dysfunction. She has tried an occasional Aleve but that's about it.  Past medical history, Surgical history, Family history not pertinant except as noted below, Social history, Allergies, and medications have been entered into the medical record, reviewed, and no changes needed.   Review of Systems: No headache, visual changes, nausea, vomiting, diarrhea, constipation, dizziness, abdominal pain, skin rash, fevers, chills, night sweats, weight loss, swollen lymph nodes, body aches, joint swelling, muscle aches, chest pain, shortness of breath, mood changes, visual or auditory hallucinations.   Objective:   General: Well Developed, well nourished, and in no acute distress.  Neuro/Psych: Alert and oriented x3, extra-ocular muscles intact, able to move all 4 extremities, sensation grossly intact. Skin: Warm and dry, no rashes noted.  Respiratory: Not using accessory muscles, speaking in full sentences, trachea midline.  Cardiovascular: Pulses palpable, no extremity edema. Abdomen: Does not appear distended. Back Exam:  Inspection: Unremarkable  Motion: Flexion 45 deg, Extension 45 deg, Side Bending to 45 deg bilaterally,  Rotation to 45 deg bilaterally  SLR laying: Negative  XSLR laying: Negative    Palpable tenderness: None. FABER: negative. Sensory change: Gross sensation intact to all lumbar and sacral dermatomes.  Reflexes: 2+ at both patellar tendons, 2+ at achilles tendons, Babinski's downgoing.  Strength at foot  Plantar-flexion: 5/5 Dorsi-flexion: 5/5 Eversion: 5/5 Inversion: 5/5  Leg strength  Quad: 5/5 Hamstring: 5/5 Hip flexor: 5/5 Hip abductors: 5/5  Gait unremarkable.  X-rays reviewed, there is multilevel degenerative disc disease worse at the L4-L5 level but also present at the L3-L4 and L5-S1 levels.  Impression and Recommendations:   This case required medical decision making of moderate complexity.

## 2013-01-13 NOTE — Assessment & Plan Note (Addendum)
Right-sided L5 versus S1. SI joint testing was negative. Formal physical therapy, Mobic. She declines prednisone and muscle relaxers. Unlike to see her back in 4 weeks, consider MRI if no better.

## 2013-02-19 ENCOUNTER — Ambulatory Visit: Payer: Medicare HMO | Admitting: Sports Medicine

## 2013-07-13 ENCOUNTER — Encounter: Payer: Self-pay | Admitting: Physician Assistant

## 2013-07-13 ENCOUNTER — Ambulatory Visit (INDEPENDENT_AMBULATORY_CARE_PROVIDER_SITE_OTHER): Payer: BC Managed Care – PPO

## 2013-07-13 ENCOUNTER — Other Ambulatory Visit: Payer: Self-pay | Admitting: Physician Assistant

## 2013-07-13 ENCOUNTER — Ambulatory Visit (INDEPENDENT_AMBULATORY_CARE_PROVIDER_SITE_OTHER): Payer: Medicare HMO | Admitting: Physician Assistant

## 2013-07-13 VITALS — BP 117/71 | HR 105 | Wt 171.0 lb

## 2013-07-13 DIAGNOSIS — S61451A Open bite of right hand, initial encounter: Secondary | ICD-10-CM

## 2013-07-13 DIAGNOSIS — S61409A Unspecified open wound of unspecified hand, initial encounter: Secondary | ICD-10-CM

## 2013-07-13 DIAGNOSIS — IMO0001 Reserved for inherently not codable concepts without codable children: Secondary | ICD-10-CM

## 2013-07-13 DIAGNOSIS — S60229A Contusion of unspecified hand, initial encounter: Secondary | ICD-10-CM

## 2013-07-13 NOTE — Progress Notes (Signed)
   Subjective:    Patient ID: Joanna Baker, female    DOB: 28-Mar-1971, 42 y.o.   MRN: [id number]  HPI Patient is a 42 year old female who presents to the clinic after By to her right hand on Friday night at approximately 9:30 PM. 4 days ago patient was coming home and found her neighbors cat on her first toe. It was very cold at night and patient decided to take at home. She picked up She approached neighbors to where the cat went crazy and started biting her. Patient had been bitten by a cat before so she just cleaned with hydrogen peroxide and iced her hand. She woke up the next morning and her hand was very swollen and she was in intense pain. She finally went to the ER on Saturday night. She was given a tetanus shot, hydrocodone for pain and started on doxycycline for infection. She has contacted animal control and they confirmed that the cat is up-to-date on his rabies vaccine. Patient is not taking hydrocodone but is concerned with the pain that she is in. She still does not have a lot of range of motion of right hand.   Review of Systems     Objective:   Physical Exam  Constitutional: She appears well-developed and well-nourished.  Musculoskeletal:  Hand grip of right hand is nonexistent. Range of motion of fingers and hand are minimal. No strength found in right wrist or hand. Significant swelling, redness, and warmth on dorsum of hand into right wrist. Well-healed puncture wounds at wrists on lateral side. Sharp and dull sensation to tip of right fingers are intact.          Assessment & Plan:  CAT Bite of right hand- will get x-ray today since imaging was not done in the emergency room. Discuss with patient that swelling is still present and could be affecting range of motion of full pain. Encouraged patient to take hydrocodone if needed for pain. Offered tramadol if she wanted to sit down and pain management. Patient declined both stating she did not want to take pain medication.  Encouraged her to at least take ibuprofen for inflammation, swelling and pain relief. Patient is not actively icing on a regular basis. Encouraged patient to ice the hand regularly. Patient has only been on doxycycline for the last 2 days. Encouraged patient to finish complete therapy of doxycycline. I also discussed with patient how wrapping her hand an Ace bandage might also give her some support. Reassured patient that range of motion and strength was slowly start to come back. If not improving in the next week after finishing antibiotic could consider MRI to make sure no tendons have been injured. Patient is up-to-date on tetanus and confirmed cath had its rabies vaccination.

## 2013-10-30 ENCOUNTER — Encounter: Payer: Self-pay | Admitting: Physician Assistant

## 2013-10-30 ENCOUNTER — Ambulatory Visit (INDEPENDENT_AMBULATORY_CARE_PROVIDER_SITE_OTHER): Payer: 59 | Admitting: Physician Assistant

## 2013-10-30 VITALS — BP 96/56 | HR 81 | Ht 67.0 in | Wt 177.0 lb

## 2013-10-30 DIAGNOSIS — Z79899 Other long term (current) drug therapy: Secondary | ICD-10-CM

## 2013-10-30 DIAGNOSIS — R8290 Unspecified abnormal findings in urine: Secondary | ICD-10-CM

## 2013-10-30 DIAGNOSIS — K7689 Other specified diseases of liver: Secondary | ICD-10-CM

## 2013-10-30 DIAGNOSIS — R1011 Right upper quadrant pain: Secondary | ICD-10-CM

## 2013-10-30 DIAGNOSIS — K760 Fatty (change of) liver, not elsewhere classified: Secondary | ICD-10-CM

## 2013-10-30 DIAGNOSIS — R82998 Other abnormal findings in urine: Secondary | ICD-10-CM

## 2013-10-30 LAB — POCT URINALYSIS DIPSTICK
BILIRUBIN UA: NEGATIVE
Blood, UA: NEGATIVE
Glucose, UA: NEGATIVE
KETONES UA: NEGATIVE
Leukocytes, UA: NEGATIVE
NITRITE UA: NEGATIVE
Protein, UA: NEGATIVE
Spec Grav, UA: 1.02
Urobilinogen, UA: 0.2
pH, UA: 8

## 2013-10-30 MED ORDER — NITROFURANTOIN MONOHYD MACRO 100 MG PO CAPS: 100.0000 mg | ORAL_CAPSULE | Freq: Two times a day (BID) | ORAL | Status: DC

## 2013-10-30 NOTE — Progress Notes (Signed)
Subjective:    Patient ID: Joanna Baker, female    DOB: [birth year], 43 y.o.   MRN: [id number]  HPI Pt presents to the clinic with urine odor for last week. Denies any fever, chills, lower abdominal pain, nausea, vomiting. She has kept the same diet. She did have a vitamin change this week. No vaginal discharge.  She is also having some RUQ pain. Describes as more of a dull ache.  She has been told she has a fatty liver as well as lactose intolerant. She was evaluated by GI about 3 years ago and told her stool get's caught in her upper GI and causes pain some times. . For last 2-3 weeks tenderness has increased. Bowel movements are ok. Seems like she is having full bowel movements since addition of special tea.    Hx of ruq removal of diary, fatty liver. Pressure has increased. Bowel movments ok 2-3 weeks goes better. Full bowel movement. Soft since the tea.    Review of Systems     Objective:   Physical Exam  Constitutional: She is oriented to person, place, and time. She appears well-developed and well-nourished.  HENT:  Head: Normocephalic and atraumatic.  Cardiovascular: Normal rate, regular rhythm and normal heart sounds.   Pulmonary/Chest: Effort normal and breath sounds normal. She has no wheezes.  No CVA tenderness.   Abdominal: Soft. Bowel sounds are normal. She exhibits no distension and no mass. There is no tenderness. There is no rebound and no guarding.  Neurological: She is alert and oriented to person, place, and time.  Psychiatric: She has a normal mood and affect. Her behavior is normal.          Assessment & Plan:  Abnormal urine order- . Marland Kitchen Results for orders placed in visit on 10/30/13  URINE CULTURE      Result Value Ref Range   Colony Count NO GROWTH     Organism ID, Bacteria NO GROWTH    CBC WITH DIFFERENTIAL      Result Value Ref Range   WBC 5.6  4.0 - 10.5 K/uL   RBC 4.57  3.87 - 5.11 MIL/uL   Hemoglobin 13.5  12.0 - 15.0 g/dL   HCT 41.3  36.0 -  46.0 %   MCV 90.4  78.0 - 100.0 fL   MCH 29.5  26.0 - 34.0 pg   MCHC 32.7  30.0 - 36.0 g/dL   RDW 13.6  11.5 - 15.5 %   Platelets 228  150 - 400 K/uL   Neutrophils Relative % 54  43 - 77 %   Neutro Abs 3.0  1.7 - 7.7 K/uL   Lymphocytes Relative 34  12 - 46 %   Lymphs Abs 1.9  0.7 - 4.0 K/uL   Monocytes Relative 10  3 - 12 %   Monocytes Absolute 0.6  0.1 - 1.0 K/uL   Eosinophils Relative 2  0 - 5 %   Eosinophils Absolute 0.1  0.0 - 0.7 K/uL   Basophils Relative 0  0 - 1 %   Basophils Absolute 0.0  0.0 - 0.1 K/uL   Smear Review Criteria for review not met    COMPLETE METABOLIC PANEL WITH GFR      Result Value Ref Range   Sodium 141  135 - 145 mEq/L   Potassium 4.0  3.5 - 5.3 mEq/L   Chloride 103  96 - 112 mEq/L   CO2 30  19 - 32 mEq/L   Glucose, Bld 81  70 - 99 mg/dL   BUN 8  6 - 23 mg/dL   Creat 0.75  0.50 - 1.10 mg/dL   Total Bilirubin 0.3  0.2 - 1.2 mg/dL   Alkaline Phosphatase 34 (*) 39 - 117 U/L   AST 18  0 - 37 U/L   ALT 14  0 - 35 U/L   Total Protein 6.4  6.0 - 8.3 g/dL   Albumin 4.2  3.5 - 5.2 g/dL   Calcium 9.1  8.4 - 10.5 mg/dL   GFR, Est African American >89     GFR, Est Non African American >89    POCT URINALYSIS DIPSTICK      Result Value Ref Range   Color, UA yellow     Clarity, UA cloudy     Glucose, UA neg     Bilirubin, UA neg     Ketones, UA neg     Spec Grav, UA 1.020     Blood, UA neg     pH, UA 8.0     Protein, UA neg     Urobilinogen, UA 0.2     Nitrite, UA neg     Leukocytes, UA Negative      Urine culture was negative as well. I feel like odor is most likely coming from something she is eating or perhaps new vitamin. Continue to monitor if continues or changes let me know.   RUQ tenderness/hx of liver enzymes elevated/fatty liver no signs of acute abdomen today. will check liver enzymes. Pt has hx of this pain due to feces getting trapped in upper GI. Discussed adding miralax at night for next couple of days. If not improving I would follow  up with GI since they have managed and see if any additional imaging is warranted.   Spent 30 minutes with pt and greater than 50 percent of visit spent counseling pt reguarding urine odor and RUQ pain.

## 2013-10-31 LAB — CBC WITH DIFFERENTIAL/PLATELET
BASOS PCT: 0 % (ref 0–1)
Basophils Absolute: 0 10*3/uL (ref 0.0–0.1)
Eosinophils Absolute: 0.1 10*3/uL (ref 0.0–0.7)
Eosinophils Relative: 2 % (ref 0–5)
HEMATOCRIT: 41.3 % (ref 36.0–46.0)
HEMOGLOBIN: 13.5 g/dL (ref 12.0–15.0)
LYMPHS ABS: 1.9 10*3/uL (ref 0.7–4.0)
LYMPHS PCT: 34 % (ref 12–46)
MCH: 29.5 pg (ref 26.0–34.0)
MCHC: 32.7 g/dL (ref 30.0–36.0)
MCV: 90.4 fL (ref 78.0–100.0)
MONO ABS: 0.6 10*3/uL (ref 0.1–1.0)
Monocytes Relative: 10 % (ref 3–12)
NEUTROS ABS: 3 10*3/uL (ref 1.7–7.7)
Neutrophils Relative %: 54 % (ref 43–77)
Platelets: 228 10*3/uL (ref 150–400)
RBC: 4.57 MIL/uL (ref 3.87–5.11)
RDW: 13.6 % (ref 11.5–15.5)
WBC: 5.6 10*3/uL (ref 4.0–10.5)

## 2013-10-31 LAB — COMPLETE METABOLIC PANEL WITH GFR
ALK PHOS: 34 U/L — AB (ref 39–117)
ALT: 14 U/L (ref 0–35)
AST: 18 U/L (ref 0–37)
Albumin: 4.2 g/dL (ref 3.5–5.2)
BUN: 8 mg/dL (ref 6–23)
CALCIUM: 9.1 mg/dL (ref 8.4–10.5)
CHLORIDE: 103 meq/L (ref 96–112)
CO2: 30 mEq/L (ref 19–32)
Creat: 0.75 mg/dL (ref 0.50–1.10)
GFR, Est African American: >89 mL/min
GFR, Est Non African American: >89 mL/min
GLUCOSE: 81 mg/dL (ref 70–99)
Potassium: 4 mEq/L (ref 3.5–5.3)
SODIUM: 141 meq/L (ref 135–145)
TOTAL PROTEIN: 6.4 g/dL (ref 6.0–8.3)
Total Bilirubin: 0.3 mg/dL (ref 0.2–1.2)

## 2013-11-01 LAB — URINE CULTURE
Colony Count: NO GROWTH
Organism ID, Bacteria: NO GROWTH

## 2013-11-02 DIAGNOSIS — K760 Fatty (change of) liver, not elsewhere classified: Secondary | ICD-10-CM | POA: Insufficient documentation

## 2013-12-11 ENCOUNTER — Ambulatory Visit (INDEPENDENT_AMBULATORY_CARE_PROVIDER_SITE_OTHER): Payer: 59 | Admitting: Physician Assistant

## 2013-12-11 ENCOUNTER — Encounter: Payer: Self-pay | Admitting: Physician Assistant

## 2013-12-11 VITALS — BP 100/52 | HR 82 | Ht 67.0 in | Wt 178.0 lb

## 2013-12-11 DIAGNOSIS — I10 Essential (primary) hypertension: Secondary | ICD-10-CM

## 2013-12-11 DIAGNOSIS — Z1239 Encounter for other screening for malignant neoplasm of breast: Secondary | ICD-10-CM

## 2013-12-11 DIAGNOSIS — F329 Major depressive disorder, single episode, unspecified: Secondary | ICD-10-CM | POA: Insufficient documentation

## 2013-12-11 DIAGNOSIS — F32A Depression, unspecified: Secondary | ICD-10-CM | POA: Insufficient documentation

## 2013-12-11 DIAGNOSIS — F3289 Other specified depressive episodes: Secondary | ICD-10-CM

## 2013-12-11 DIAGNOSIS — N644 Mastodynia: Secondary | ICD-10-CM

## 2013-12-11 DIAGNOSIS — IMO0002 Reserved for concepts with insufficient information to code with codable children: Secondary | ICD-10-CM

## 2013-12-11 DIAGNOSIS — N62 Hypertrophy of breast: Secondary | ICD-10-CM

## 2013-12-11 DIAGNOSIS — T50995A Adverse effect of other drugs, medicaments and biological substances, initial encounter: Secondary | ICD-10-CM

## 2013-12-11 DIAGNOSIS — T7840XA Allergy, unspecified, initial encounter: Secondary | ICD-10-CM

## 2013-12-11 DIAGNOSIS — F411 Generalized anxiety disorder: Secondary | ICD-10-CM | POA: Insufficient documentation

## 2013-12-11 MED ORDER — EPINEPHRINE 0.3 MG/0.3ML IJ SOAJ: 0.3000 mg | Freq: Once | INTRAMUSCULAR | Status: DC

## 2013-12-11 NOTE — Progress Notes (Signed)
   Subjective:    Patient ID: Joanna Baker, female    DOB: 07-31-1970, 43 y.o.   MRN: [id number]  HPI Patient is a 43 year old female who presents to the clinic to followup after allergic reaction. On 11/25/13 patient took one of her partners Suboxone to just see what it felt like. She began to feel like she cannot swallow, her chest felt tight. She did use her EpiPen since pt has hx of anaphaxysis.  she called her partner and they took her to the hospital. She was placed on prednisone and Benadryl. Her symptoms have resolved today.  She also comes to the clinic to discuss her breasts. Pt feels like her breasts are bigger and more tender for the last couple of months. She does feel like about a week and a half to 2 weeks before her period they are the biggest and most tender. She denies any palpable lump. Patient denies any nipple discharge.  She is due for her mammogram.     Review of Systems  All other systems reviewed and are negative.      Objective:   Physical Exam  Constitutional: She is oriented to person, place, and time. She appears well-developed and well-nourished.  HENT:  Head: Normocephalic and atraumatic.  Cardiovascular: Normal rate, regular rhythm and normal heart sounds.   Pulmonary/Chest: Effort normal and breath sounds normal.  Neurological: She is alert and oriented to person, place, and time.  Skin: Skin is dry.  Psychiatric: She has a normal mood and affect. Her behavior is normal.          Assessment & Plan:   allergic reaction to a drug -discuss with patient not to try other peoples medication. She was prescribed another EpiPen to keep on hand for any other visits where she may feel like she could not breathe her going to an anaphylaxis reaction.  Breast tenderness and enlargement-discuss with patient that the symptoms are normal to come and go during the hormonal cycle. Also discussed with patient she has gained 20 pounds in the last year and could  contribute to some breast enlargement. She is due for mammogram and will order today. Do not think there is any suspicious findings at this time. Reassurance was given to the patient.

## 2013-12-11 NOTE — Patient Instructions (Addendum)
Get mammogram.  Start zyrtec/claritin.

## 2013-12-15 ENCOUNTER — Telehealth: Payer: Self-pay

## 2013-12-15 DIAGNOSIS — N644 Mastodynia: Secondary | ICD-10-CM

## 2013-12-15 DIAGNOSIS — Z1239 Encounter for other screening for malignant neoplasm of breast: Secondary | ICD-10-CM

## 2013-12-15 NOTE — Telephone Encounter (Signed)
[facility] called and stated due to the diagnosis of breast tenderness the order should be a bilateral diagnostic mammogram. Is it ok to change order?

## 2013-12-15 NOTE — Telephone Encounter (Signed)
ok 

## 2013-12-16 NOTE — Telephone Encounter (Signed)
Entered referral

## 2014-01-05 ENCOUNTER — Other Ambulatory Visit: Payer: Self-pay | Admitting: *Deleted

## 2014-01-05 ENCOUNTER — Telehealth: Payer: Self-pay | Admitting: *Deleted

## 2014-01-05 NOTE — Telephone Encounter (Signed)
Yes fine

## 2014-01-05 NOTE — Telephone Encounter (Signed)
I called and left a voice mail asking for patient to call back. I stated in the msg that there were several options for smoking cessation such as wellbutrin, chantix or patches. She needs to let us know what she is wanting to do and we will proceed from there.

## 2014-01-05 NOTE — Telephone Encounter (Signed)
Pt called today stating that she had asked for a rx for nicotine patches while she was here at her last visit.  I didn't see them on her med list.  Can we send this in for her? Please advise.

## 2014-01-06 ENCOUNTER — Other Ambulatory Visit: Payer: Self-pay | Admitting: Family Medicine

## 2014-01-06 DIAGNOSIS — N644 Mastodynia: Secondary | ICD-10-CM

## 2014-01-07 ENCOUNTER — Other Ambulatory Visit: Payer: Self-pay | Admitting: Physician Assistant

## 2014-01-07 MED ORDER — NICOTINE 21 MG/24HR TD PT24: 21.0000 mg | MEDICATED_PATCH | Freq: Every day | TRANSDERMAL | Status: DC

## 2014-01-07 NOTE — Telephone Encounter (Signed)
Pt called back stating that she is already on Wellbutrin from another provider & she had tried chantix in the past & it made her feel suicidal. She prefers the patches.

## 2014-01-07 NOTE — Telephone Encounter (Signed)
Left detailed message on vm notifying pt of patch sent to pharm.

## 2014-01-07 NOTE — Telephone Encounter (Signed)
Sent highest dose of patch to start for one month.

## 2014-01-11 ENCOUNTER — Other Ambulatory Visit: Payer: Self-pay | Admitting: *Deleted

## 2014-01-11 ENCOUNTER — Other Ambulatory Visit: Payer: Self-pay | Admitting: Family Medicine

## 2014-01-11 ENCOUNTER — Other Ambulatory Visit: Payer: Self-pay

## 2014-01-11 DIAGNOSIS — N644 Mastodynia: Secondary | ICD-10-CM

## 2014-01-11 MED ORDER — NICOTINE 14 MG/24HR TD PT24: 14.0000 mg | MEDICATED_PATCH | Freq: Every day | TRANSDERMAL | Status: DC

## 2014-01-12 ENCOUNTER — Telehealth: Payer: Self-pay | Admitting: *Deleted

## 2014-01-12 NOTE — Telephone Encounter (Signed)
Nicoderm patch approved through [facility]. Covered until 01/12/15.

## 2014-01-14 ENCOUNTER — Ambulatory Visit
Admission: RE | Admit: 2014-01-14 | Discharge: 2014-01-14 | Disposition: A | Discharge status: Home / Self Care | Payer: 59 | Source: Ambulatory Visit | Attending: Family Medicine | Admitting: Family Medicine

## 2014-01-14 DIAGNOSIS — N644 Mastodynia: Secondary | ICD-10-CM

## 2014-07-05 ENCOUNTER — Encounter: Payer: Self-pay | Admitting: Physician Assistant

## 2014-07-05 ENCOUNTER — Ambulatory Visit (INDEPENDENT_AMBULATORY_CARE_PROVIDER_SITE_OTHER): Payer: 59

## 2014-07-05 ENCOUNTER — Ambulatory Visit (INDEPENDENT_AMBULATORY_CARE_PROVIDER_SITE_OTHER): Payer: 59 | Admitting: Physician Assistant

## 2014-07-05 VITALS — BP 103/59 | HR 80 | Temp 98.1°F | Ht 67.0 in | Wt 173.0 lb

## 2014-07-05 DIAGNOSIS — R05 Cough: Secondary | ICD-10-CM

## 2014-07-05 DIAGNOSIS — R0602 Shortness of breath: Secondary | ICD-10-CM

## 2014-07-05 DIAGNOSIS — R059 Cough, unspecified: Secondary | ICD-10-CM

## 2014-07-05 MED ORDER — PREDNISONE 50 MG PO TABS: ORAL_TABLET | ORAL | Status: DC

## 2014-07-05 MED ORDER — ALBUTEROL SULFATE 108 (90 BASE) MCG/ACT IN AEPB: 2.0000 | INHALATION_SPRAY | RESPIRATORY_TRACT | Status: DC | PRN

## 2014-07-05 NOTE — Patient Instructions (Addendum)
Start zyrtec at bedtime 10mg . Prednisone course.  Will give rescue inhaler to use as needed.

## 2014-07-05 NOTE — Progress Notes (Signed)
   Subjective:    Patient ID: Joanna Baker, female    DOB: Mar 17, 1971, 43 y.o.   MRN: [id number]  HPI  Pt presents to the clinic with cough and SOB for last month. She remembers sanding and dust seemed to get in her nose and lungs. She has not felt right since. She is very weak. She feels like her chest is tight with wheezing. Dry cough started about 24 hours ago. She has had some sick exposures to bonrchitis and pneumonia. She has been taking sudafed and mucinex. She has a headache today. Worked out earlier this week and felt a little winded but able to complete work out.   Review of Systems  All other systems reviewed and are negative.      Objective:   Physical Exam  Constitutional: She is oriented to person, place, and time. She appears well-developed and well-nourished.  HENT:  Head: Normocephalic and atraumatic.  Right Ear: External ear normal.  Left Ear: External ear normal.  Nose: Nose normal.  Mouth/Throat: Oropharynx is clear and moist. No oropharyngeal exudate.  Eyes: Conjunctivae are normal. Right eye exhibits no discharge. Left eye exhibits no discharge.  Neck: Normal range of motion. Neck supple.  Cardiovascular: Normal rate, regular rhythm and normal heart sounds.   Pulmonary/Chest: Effort normal and breath sounds normal. She has no wheezes.  Lymphadenopathy:    She has no cervical adenopathy.  Neurological: She is alert and oriented to person, place, and time.  Skin: Skin is dry.  Psychiatric: She has a normal mood and affect. Her behavior is normal.          Assessment & Plan:  SOB/Cough- peak flows were all in green. Lung exam sounds great today. Pulse ox is 99 percent. Will get CXR. No fever. Will get CBC. Started prednisone for next 5 days. Albuterol inhaler rx given to use if feels SOB to see if will help but unlikely since peak flows great. Add zyrtec 10mg  to bedtime to help block any histamine response.  Follow up as needed.

## 2014-07-06 ENCOUNTER — Telehealth: Payer: Self-pay

## 2014-07-06 ENCOUNTER — Other Ambulatory Visit: Payer: Self-pay | Admitting: Physician Assistant

## 2014-07-06 LAB — CBC WITH DIFFERENTIAL/PLATELET
BASOS PCT: 0 % (ref 0–1)
Basophils Absolute: 0 10*3/uL (ref 0.0–0.1)
EOS ABS: 0.1 10*3/uL (ref 0.0–0.7)
Eosinophils Relative: 1 % (ref 0–5)
HCT: 39.5 % (ref 36.0–46.0)
HEMOGLOBIN: 13.4 g/dL (ref 12.0–15.0)
Lymphocytes Relative: 24 % (ref 12–46)
Lymphs Abs: 1.9 10*3/uL (ref 0.7–4.0)
MCH: 29.6 pg (ref 26.0–34.0)
MCHC: 33.9 g/dL (ref 30.0–36.0)
MCV: 87.2 fL (ref 78.0–100.0)
MPV: 10.7 fL (ref 9.4–12.4)
Monocytes Absolute: 0.7 10*3/uL (ref 0.1–1.0)
Monocytes Relative: 9 % (ref 3–12)
NEUTROS ABS: 5.3 10*3/uL (ref 1.7–7.7)
Neutrophils Relative %: 66 % (ref 43–77)
Platelets: 215 10*3/uL (ref 150–400)
RBC: 4.53 MIL/uL (ref 3.87–5.11)
RDW: 13.3 % (ref 11.5–15.5)
WBC: 8 10*3/uL (ref 4.0–10.5)

## 2014-07-06 MED ORDER — AZITHROMYCIN 250 MG PO TABS: ORAL_TABLET | ORAL | Status: DC

## 2014-07-06 NOTE — Telephone Encounter (Signed)
Patient advised, she will decline the antibiotic at this point. Advised her to call back if symptoms worsen or persist.

## 2014-07-06 NOTE — Telephone Encounter (Signed)
Joanna Baker states today she has body aches green drainage from her nose and productive cough with green sputum.

## 2014-07-06 NOTE — Telephone Encounter (Signed)
Nothing was seen infectious on exam yesterday. Will send antibiotic to try but if still viral may not improve symptoms.

## 2014-07-06 NOTE — Telephone Encounter (Signed)
i did send so can pick up if need to.

## 2014-07-07 ENCOUNTER — Telehealth: Payer: Self-pay | Admitting: *Deleted

## 2014-11-03 ENCOUNTER — Ambulatory Visit: Payer: Self-pay | Admitting: Physician Assistant

## 2014-11-05 ENCOUNTER — Ambulatory Visit (INDEPENDENT_AMBULATORY_CARE_PROVIDER_SITE_OTHER): Payer: Self-pay | Admitting: Physician Assistant

## 2014-11-05 ENCOUNTER — Encounter: Payer: Self-pay | Admitting: Physician Assistant

## 2014-11-05 VITALS — BP 113/64 | HR 81 | Ht 67.0 in | Wt 185.0 lb

## 2014-11-05 DIAGNOSIS — N949 Unspecified condition associated with female genital organs and menstrual cycle: Secondary | ICD-10-CM

## 2014-11-05 DIAGNOSIS — N943 Premenstrual tension syndrome: Secondary | ICD-10-CM

## 2014-11-05 MED ORDER — LORAZEPAM 0.5 MG PO TABS: 0.5000 mg | ORAL_TABLET | Freq: Three times a day (TID) | ORAL | Status: DC | PRN

## 2014-11-05 NOTE — Patient Instructions (Addendum)
Pyridoxine 25-100 once daily.  Discussed appt with GYN.   Premenstrual Syndrome Premenstrual syndrome (PMS) is a condition that consists of physical, emotional, and behavioral symptoms that affect women of childbearing age. PMS occurs 5-14 days before the start of a menstrual period and often recurs in a predictable pattern. The symptoms go away a few days after the menstrual period starts. PMS can interfere in many ways with normal daily activities and can range from mild to severe. When PMS is considered severe, it may be diagnosed as premenstrual dysphoric disorder (PMDD). A small percentage of women are affected by PMS symptoms and an even smaller percentage of those women are affected by PMDD.  CAUSES  The exact cause of PMS is unknown, but it seems to be related to cyclic hormone changes that happen before menstruation. These hormones are thought to affect chemicals in the brain (serotonin) that can influence a person's mood.  SYMPTOMS  Symptoms of PMS recur consistently from month to month and go away completely after the menstrual period starts. The most common emotional or behavioral symptom is mood swings. These mood swings can be disabling and interfere with normal activities of daily living. Other common symptoms include depression and angry outbursts. Other symptoms may include:   Irritability.  Anxiety.  Crying spells.   Food cravings or appetite changes.   Changes in sexual desire.   Confusion.   Aggression.   Social withdrawal.   Poor concentration. The most common physical symptoms include a sense of bloating, breast pain, headaches, and extreme fatigue. Other physical symptoms include:   Backaches.   Swelling of the hands and feet.   Weight gain.   Hot flashes.  DIAGNOSIS  To make a diagnosis, your caregiver will ask questions to confirm that you are having a pattern of symptoms. Symptoms must:   Be present 5 days before the start of your period and  be present at least 3 months in a row.   End within 4 days after your period starts.   Interfere with some of your normal activities.  Other conditions, such as thyroid disease, depression, and migraine headaches must be ruled out before a diagnosis of PMS is confirmed.  TREATMENT  Your caregiver may suggest ways to maintain a healthy lifestyle, such as exercise. Over-the-counter pain relievers may ease cramps, aches, pains, headaches, and breast tenderness. However, selective serotonin reuptake inhibitors (SSRIs) are medicines that are most beneficial in improving PMS if taken in the second half of the monthly cycle. They may be taken on a daily basis. The most effective oral contraceptive pill used for symptoms of PMS is one that contains the ingredient drospirenone. Taking 4 days off of the pill instead of the usual 7 days also has shown to increase effectiveness.  There are a number of drugs, dietary supplements, vitamins, and water pills (diuretics) which have been suggested to be helpful but have not shown to be of any benefit to improving PMS symptoms.  HOME CARE INSTRUCTIONS   For 2-3 months, write down your symptoms, their severity, and how long they last. This may help your caregiver prescribe the best treatment for your symptoms.  Exercise regularly as suggested by your caregiver.  Eat a regular, well-balanced diet.  Avoid caffeine, alcohol, and tobacco consumption.  Limit salt and salty foods to lessen bloating and fluid retention.  Get enough sleep. Practice relaxation techniques.  Drink enough fluids to keep your urine clear or pale yellow.  Take medicines as directed by your caregiver.  Limit stress.  Take a multivitamin as directed by your caregiver. Document Released: 06/29/2000 Document Revised: 03/26/2012 Document Reviewed: 11/19/2011 [facility] Patient Information 2015 [place], [place]. This information is not intended to replace advice given to you by your health  care provider. Make sure you discuss any questions you have with your health care provider.

## 2014-11-07 DIAGNOSIS — N943 Premenstrual tension syndrome: Secondary | ICD-10-CM | POA: Insufficient documentation

## 2014-11-07 NOTE — Progress Notes (Signed)
   Subjective:    Patient ID: Joanna Baker, female    DOB: [birth year], 44 y.o.   MRN: [id number]  HPI Pt presents to the clinic to discuss raging mood changes around cycle. She has logged activity and within 5 days before or after period pt has uncontrollable mood swings. She is easily angered, very aggressive, irritable, down, and just feels like she is going to lose control. She has had symptoms for the couple of years. Tried wellbutrin at one point with no relief. In the past tried prozac which called palpitations. Other medications have caused worsening mood swings. Denies any suicidal or homicidal thoughts. She does not want to take benzos.    Review of Systems  All other systems reviewed and are negative.      Objective:   Physical Exam  Constitutional: She is oriented to person, place, and time. She appears well-developed and well-nourished.  HENT:  Head: Normocephalic and atraumatic.  Cardiovascular: Normal rate, regular rhythm and normal heart sounds.   Pulmonary/Chest: Effort normal and breath sounds normal.  Neurological: She is alert and oriented to person, place, and time.  Psychiatric:  Tearful, frustrated throughout interview.           Assessment & Plan:  Pre-menstrual symptoms-tried and failed one SSRI. Pt is very hesisent to try any other medications. i did give a small quanity of ativan for acute symptoms if only using a couple of times a month consider controlled. Discussed abuse potential. Pt has tried numerous natural agents but did give a dose for B6. Given HO. Encouraged exercise. offerred psych refierral pt declined today. Pt wants to discuss oophorectomy and directed her to make appt with GYN. i am certainly somewhat suspicious of perhaps daily mood changes and anxiety vs only around cycle.

## 2014-12-02 ENCOUNTER — Ambulatory Visit (INDEPENDENT_AMBULATORY_CARE_PROVIDER_SITE_OTHER): Payer: 59 | Admitting: Obstetrics & Gynecology

## 2014-12-02 ENCOUNTER — Encounter: Payer: Self-pay | Admitting: Obstetrics & Gynecology

## 2014-12-02 VITALS — BP 93/55 | HR 80 | Resp 16 | Ht 67.0 in | Wt 182.0 lb

## 2014-12-02 DIAGNOSIS — Z124 Encounter for screening for malignant neoplasm of cervix: Secondary | ICD-10-CM

## 2014-12-02 DIAGNOSIS — Z1151 Encounter for screening for human papillomavirus (HPV): Secondary | ICD-10-CM

## 2014-12-02 DIAGNOSIS — Z01419 Encounter for gynecological examination (general) (routine) without abnormal findings: Secondary | ICD-10-CM

## 2014-12-02 DIAGNOSIS — N943 Premenstrual tension syndrome: Secondary | ICD-10-CM

## 2014-12-02 DIAGNOSIS — Z0000 Encounter for general adult medical examination without abnormal findings: Secondary | ICD-10-CM

## 2014-12-02 MED ORDER — ESCITALOPRAM OXALATE 10 MG PO TABS: ORAL_TABLET | ORAL | Status: DC

## 2014-12-02 NOTE — Progress Notes (Signed)
Subjective:    Joanna Baker is a 44 y.o. SWP0 female who presents for an annual exam. The patient has no complaints today except that she complains of "extreme hormonal surges 7-14 days of her period, and 4 days after her periods." Her periods are monthly and last about 5 days.  The patient is not currently sexually active. GYN screening history: last pap: was normal. The patient wears seatbelts: yes. The patient participates in regular exercise: yes. Has the patient ever been transfused or tattooed?: yes. The patient reports that there is not domestic violence in her life.   Menstrual History: OB History    No data available      Menarche age: 63  Patient's last menstrual period was 11/16/2014.    The following portions of the patient's history were reviewed and updated as appropriate: allergies, current medications, past family history, past medical history, past social history, past surgical history and problem list.  Review of Systems Pertinent items are noted in HPI.  Mammogram UTD, due in the Winter. She has been treated with Wellbutrin in the past but didn't like it.   Objective:    BP 93/55 mmHg  Pulse 80  Resp 16  Ht 5\' 7"  (1.702 m)  Wt 182 lb (82.555 kg)  BMI 28.50 kg/m2  LMP 11/16/2014  General Appearance:    Alert, cooperative, no distress, appears stated age  Head:    Normocephalic, without obvious abnormality, atraumatic  Eyes:    PERRL, conjunctiva/corneas clear, EOM's intact, fundi    benign, both eyes  Ears:    Normal TM's and external ear canals, both ears  Nose:   Nares normal, septum midline, mucosa normal, no drainage    or sinus tenderness  Throat:   Lips, mucosa, and tongue normal; teeth and gums normal  Neck:   Supple, symmetrical, trachea midline, no adenopathy;    thyroid:  no enlargement/tenderness/nodules; no carotid   bruit or JVD  Back:     Symmetric, no curvature, ROM normal, no CVA tenderness  Lungs:     Clear to auscultation bilaterally,  respirations unlabored  Chest Wall:    No tenderness or deformity   Heart:    Regular rate and rhythm, S1 and S2 normal, no murmur, rub   or gallop  Breast Exam:    No tenderness, masses, or nipple abnormality  Abdomen:     Soft, non-tender, bowel sounds active all four quadrants,    no masses, no organomegaly  Genitalia:    Normal female without lesion, discharge or tenderness     Extremities:   Extremities normal, atraumatic, no cyanosis or edema  Pulses:   2+ and symmetric all extremities  Skin:   Skin color, texture, turgor normal, no rashes or lesions  Lymph nodes:   Cervical, supraclavicular, and axillary nodes normal  Neurologic:   CNII-XII intact, normal strength, sensation and reflexes    throughout  .    Assessment:    Healthy female exam.   She believes that she has PMDD and would like treatment.   Plan:     Breast self exam technique reviewed and patient encouraged to perform self-exam monthly. Thin prep Pap smear.   RTC for fasting labs Trial of Lexapro 2 weeks prior to period (She does not want to take it daily)

## 2014-12-06 LAB — CYTOLOGY - PAP

## 2014-12-27 ENCOUNTER — Other Ambulatory Visit: Payer: Self-pay

## 2014-12-27 MED ORDER — LORAZEPAM 0.5 MG PO TABS: 0.5000 mg | ORAL_TABLET | Freq: Three times a day (TID) | ORAL | Status: DC | PRN

## 2015-02-01 ENCOUNTER — Telehealth: Payer: Self-pay

## 2015-02-01 NOTE — Telephone Encounter (Signed)
Patient called and left a message on nurse line asking for a return call.   Returned Call: Left message asking patient to call back.  

## 2015-02-01 NOTE — Telephone Encounter (Signed)
Patient went to [facility] for a rash.

## 2015-02-04 ENCOUNTER — Emergency Department
Admission: EM | Admit: 2015-02-04 | Discharge: 2015-02-04 | Disposition: A | Discharge status: Home / Self Care | Payer: 59 | Source: Home / Self Care | Attending: Family Medicine | Admitting: Family Medicine

## 2015-02-04 ENCOUNTER — Encounter: Payer: Self-pay | Admitting: Emergency Medicine

## 2015-02-04 DIAGNOSIS — L255 Unspecified contact dermatitis due to plants, except food: Secondary | ICD-10-CM | POA: Diagnosis not present

## 2015-02-04 DIAGNOSIS — J029 Acute pharyngitis, unspecified: Secondary | ICD-10-CM

## 2015-02-04 HISTORY — DX: Anxiety disorder, unspecified: F41.9

## 2015-02-04 LAB — POCT RAPID STREP A (OFFICE): Rapid Strep A Screen: NEGATIVE

## 2015-02-04 MED ORDER — TRIAMCINOLONE ACETONIDE 40 MG/ML IJ SUSP
40.0000 mg | Freq: Once | INTRAMUSCULAR | Status: AC
  Administered 2015-02-04: 40 mg via INTRAMUSCULAR

## 2015-02-04 MED ORDER — AZITHROMYCIN 250 MG PO TABS: ORAL_TABLET | ORAL | Status: DC

## 2015-02-04 NOTE — ED Provider Notes (Signed)
CSN: [id number]     Arrival date & time 02/04/15  1618 History   First MD Initiated Contact with Patient 02/04/15 1709     Chief Complaint  Patient presents with  . Rash      HPI Comments: Patient was pulling weeds in her yard five days ago, and the next day she developed a pruritic rash on her arms.  Her rash has improved with application of triamcinolone cream and several OTC treatments. Last night she developed a sore throat with chills, nausea, and abdominal bloating.  No cough or nasal congestion.  She is concerned that the poison ivy may be causing her sore throat.  The history is provided by the patient.    Past Medical History  Diagnosis Date  . IBS (irritable bowel syndrome)   . TB (tuberculosis), treated      6 month of INH at age 44  . ACL injury tear     Right knee, not repaired.    . Fatty liver   . Anxiety    Past Surgical History  Procedure Laterality Date  . Tonsillectomy  1993   Family History  Problem Relation Age of Onset  . Pulmonary embolism Mother   . Stroke Mother 29  . Alcohol abuse Father   . Depression Paternal Grandfather   . Heart attack Maternal Grandmother    History  Substance Use Topics  . Smoking status: Current Every Day Smoker -- 0.25 packs/day for 5 years    Types: Cigarettes  . Smokeless tobacco: Never Used  . Alcohol Use: 1.2 oz/week    2 Cans of beer per week   OB History    No data available     Review of Systems + sore throat No cough No pleuritic pain No wheezing No nasal congestion ? post-nasal drainage No sinus pain/pressure No itchy/red eyes No earache No hemoptysis No SOB No fever, + chills + nausea No vomiting No abdominal pain but bloating present No diarrhea No urinary symptoms No skin rash + fatigue No myalgias No headache    Allergies  Influenza vaccines; Iodine; Chantix; Lobster; Penicillins; Prednisone; Prozac; Suboxone; and Sulfa antibiotics  Home Medications   Prior to Admission medications   Medication Sig Start Date End Date Taking? Authorizing Provider  azithromycin (ZITHROMAX Z-PAK) 250 MG tablet Take 2 tabs today; then begin one tab once daily for 4 more days. (Rx void after 02/12/15) 02/04/15   Kandra Nicolas, MD  Calcium Carbonate-Vitamin D (CALCIUM-VITAMIN D) 500-200 MG-UNIT per tablet Take 1 tablet by mouth daily.      Historical Provider, MD  EPINEPHrine 0.3 mg/0.3 mL IJ SOAJ injection Inject 0.3 mLs (0.3 mg total) into the muscle once. 12/11/13   Jade L Breeback, PA-C  escitalopram (LEXAPRO) 10 MG tablet As directed 12/02/14   Emily Filbert, MD  EVENING PRIMROSE OIL PO Take by mouth daily.    Historical Provider, MD  Kava, Piper methysticum, (KAVA KAVA PO) Take 10 drops by mouth as needed.      Historical Provider, MD  loratadine (CLARITIN) 10 MG tablet Take 10 mg by mouth daily.    Historical Provider, MD  LORazepam (ATIVAN) 0.5 MG tablet Take 1 tablet (0.5 mg total) by mouth every 8 (eight) hours as needed for anxiety. 12/27/14   Hali Marry, MD  MAGNESIUM CARBONATE PO Take by mouth daily.    Historical Provider, MD  Multiple Vitamin (MULTIVITAMIN WITH MINERALS) TABS Take 1 tablet by mouth daily.    Historical Provider, MD  naproxen sodium (ANAPROX) 220 MG tablet Take 220 mg by mouth as  needed.     Historical Provider, MD  nicotine (NICODERM CQ - DOSED IN MG/24 HOURS) 14 mg/24hr patch Place 1 patch (14 mg total) onto the skin daily. 01/11/14   Jade L Breeback, PA-C  pseudoephedrine (SUDAFED) 30 MG tablet Take 30 mg by mouth every 4 (four) hours as needed.    Historical Provider, MD   BP 94/61 mmHg  Pulse 90  Temp(Src) 98.6 F (37 C) (Oral)  Resp 18  Ht 5\' 7"  (1.702 m)  Wt 185 lb (83.915 kg)  BMI 28.97 kg/m2  SpO2 98% Physical  Exam  Constitutional: She is oriented to person, place, and time. She appears well-developed and well-nourished. No distress.  HENT:  Head: Normocephalic.  Nose: Nose normal.  Pharynx is erythematous without exudate.  Eyes: Conjunctivae are normal. Pupils are equal, round, and reactive to light.  Neck: Neck supple.  Tender slightly enlarged anterior nodes are palpated.  Posterior shotty nodes are slightly tender.  Cardiovascular: Normal heart sounds.   Pulmonary/Chest: Breath sounds normal.  Abdominal: There is no tenderness.  Musculoskeletal: She exhibits no edema.  Lymphadenopathy:    She has cervical adenopathy.  Neurological: She is alert and oriented to person, place, and time.  Skin: Skin is warm and dry. Rash noted. Rash is maculopapular.     Bilateral forearms have maculopapular mildy erythematous eruption that appears to be healing.  No vesicles or weeping present.  Nursing note and vitals reviewed.   ED Course  Procedures  None    Labs Reviewed  STREP A DNA PROBE  POCT RAPID STREP A (OFFICE) negative      MDM   1. Rhus dermatitis   2. Acute pharyngitis, unspecified pharyngitis type; suspect early viral URI    Throat culture pending Kenalog 40mg  administered Continue present treatment for poison ivy rash.  If cold symptoms develop, try the following: Take plain guaifenesin (1200mg  extended release tabs such as Mucinex) twice daily, with plenty of water, for cough and congestion.  May add Pseudoephedrine (30mg ,  one or two every 4 to 6 hours) for sinus congestion.  Get adequate rest.   May use Afrin nasal spray (or generic oxymetazoline) twice daily for about 5 days.  Also recommend using saline nasal spray several times daily and saline nasal irrigation (AYR is a common brand).  Use Flonase nasal spray each morning after using Afrin nasal spray and saline nasal irrigation. Try warm salt water gargles for sore throat.  Stop all antihistamines for now, and other non-prescription cough/cold preparations.  May take Delsym Cough Suppressant at bedtime for nighttime cough.   Begin Azithromycin if throat culture positive, if not improving about one week, or if persistent fever develops (Given a prescription to hold, with an expiration date)  Follow-up with family doctor if not improving about 7 to10 days.     Kandra Nicolas, MD 02/06/15 [phone]

## 2015-02-04 NOTE — Discharge Instructions (Signed)
Continue present treatment for poison ivy rash.  If cold symptoms develop, try the following: Take plain guaifenesin (1200mg  extended release tabs such as Mucinex) twice daily, with plenty of water, for cough and congestion.  May add Pseudoephedrine (30mg , one or two every 4 to 6 hours) for sinus congestion.  Get adequate rest.   May use Afrin nasal spray (or generic oxymetazoline) twice daily for about 5 days.  Also recommend using saline nasal spray several times daily and saline nasal irrigation (AYR is a common brand).  Use Flonase nasal spray each morning after using Afrin nasal spray and saline nasal irrigation. Try warm salt water gargles for sore throat.  Stop all antihistamines for now, and other non-prescription cough/cold preparations. May take Delsym Cough Suppressant at bedtime for nighttime cough.   Begin Azithromycin if throat culture positive, if not improving about one week, or if persistent fever develops   Follow-up with family doctor if not improving about 7 to10 days.

## 2015-02-04 NOTE — ED Notes (Signed)
Reports onset of rash on both arms, with scattered other rare spots on neck and foot; 5 days ago.  Today she is concerned because her throat feels strange and she thought she saw blisters at back of mouth. Patient took benadryl 12.5mg  po 3 hours ago. No signs of dyspnea, but she is clearly nervous. Mother is with her for support.

## 2015-02-05 LAB — STREP A DNA PROBE: GASP: NEGATIVE

## 2015-02-07 ENCOUNTER — Telehealth: Payer: Self-pay | Admitting: Emergency Medicine

## 2015-02-07 LAB — HEPATITIS B CORE ANTIBODY, TOTAL: HEP B VIRAL DNA COPIES/ML: NONREACTIVE

## 2015-02-07 LAB — CBC AND DIFFERENTIAL
HEMOGLOBIN: 12.6 g/dL (ref 12.0–16.0)
Platelets: 198 10*3/uL (ref 150–399)
WBC: 5.6 10*3/mL

## 2015-02-07 LAB — HEPATIC FUNCTION PANEL
ALT: 9 U/L (ref 7–35)
AST: 13 U/L (ref 13–35)
Alkaline Phosphatase: 32 U/L (ref 25–125)

## 2015-02-07 LAB — HIV ANTIBODY: HIV: NONREACTIVE

## 2015-02-07 LAB — LIPID PANEL
Cholesterol: 152 mg/dL (ref 0–200)
HDL: 56 mg/dL (ref 35–70)
LDL CALC: 88 mg/dL
Triglycerides: 61 mg/dL (ref 40–160)

## 2015-02-07 LAB — HEPATITIS B E ANTIGEN: HEP B VIRAL DNA IU/ML: NONREACTIVE

## 2015-02-07 LAB — CALCIUM: Calcium: 8.9 mg/dL

## 2015-02-07 LAB — HEMOGLOBIN A1C: Hgb A1c MFr Bld: 4.6 % (ref 4.0–6.0)

## 2015-02-07 LAB — CHLORIDE: Chloride: 107 mmol/L

## 2015-02-07 LAB — BASIC METABOLIC PANEL
CREATININE: 0.6 mg/dL (ref 0.5–1.1)
GLUCOSE: 83 mg/dL
Potassium: 4 mmol/L (ref 3.4–5.3)
SODIUM: 140 mmol/L (ref 137–147)

## 2015-02-07 LAB — HEPATITIS B SURFACE ANTIBODY,QUALITATIVE: Hep B Surface Ab, Qual: NONREACTIVE

## 2015-02-07 LAB — PROTEIN, TOTAL: Protein: 6.4

## 2015-02-07 LAB — HEPATITIS C ANTIBODY: Hep C Virus Ab: REACTIVE

## 2015-02-08 ENCOUNTER — Telehealth: Payer: Self-pay | Admitting: *Deleted

## 2015-02-08 DIAGNOSIS — R768 Other specified abnormal immunological findings in serum: Secondary | ICD-10-CM

## 2015-02-08 NOTE — Telephone Encounter (Signed)
Just an [place] from [place] called today to inform us that while working pt up to be a living donor, they found her Hep C antibodies to be reactive.  Pt is very upset and wants a day to absorb this information.  She will be calling our office to send her to whatever specialist is needed.  I was informed that the pt has never used needles for drugs and hasn't been with a man in the last 4 yrs.  She does have multiple tattoos so maybe that could be the cause.  Jaclyn Shaggy is faxing over all of the pt's results today so we can have them as well.

## 2015-02-08 NOTE — Telephone Encounter (Signed)
Pt notified & is going to have blood drawn this afternoon.

## 2015-02-08 NOTE — Telephone Encounter (Signed)
Received recent labs.  We will need additional blood work too.  I will print labs and she can go at any time.

## 2015-02-09 ENCOUNTER — Encounter: Payer: Self-pay | Admitting: Family Medicine

## 2015-02-09 ENCOUNTER — Ambulatory Visit (INDEPENDENT_AMBULATORY_CARE_PROVIDER_SITE_OTHER): Payer: 59 | Admitting: Family Medicine

## 2015-02-09 VITALS — BP 108/73 | HR 84 | Wt 183.0 lb

## 2015-02-09 DIAGNOSIS — M79604 Pain in right leg: Secondary | ICD-10-CM | POA: Diagnosis not present

## 2015-02-09 LAB — HEPATITIS C ANTIBODY: HCV Ab: NEGATIVE

## 2015-02-09 NOTE — Assessment & Plan Note (Signed)
Likely piriformis syndrome. Tx home exercise program and refer to PT. Continue OTC meds PRN.  Will obtain lumbar x-rays. Additionally will get d-dimer and CBC and metabolic panel to assess for DVT risk. Return in a few weeks

## 2015-02-09 NOTE — Progress Notes (Signed)
Joanna Baker is a 44 y.o. female who presents to [facility]  today for right leg pain. Patient is a two-month history of pain radiating from her right buttocks to her right leg. This is worse with prolonged sitting. No weakness or numbness bowel bladder dysfunction or difficulty walking. She notes occasional leg swelling. Her mother had 3 DVTs and she is concerned about the possibility of a DVT herself. No chest pains or palpitations. She's tried some over-the-counter medicines which helps some. Additionally she notes an occasional throbbing sensation in her right heel.   Past Medical History  Diagnosis Date  . IBS (irritable bowel syndrome)   . TB (tuberculosis), treated      6 month of INH at age 41  . ACL injury tear     Right knee, not repaired.    . Fatty liver   . Anxiety    Past Surgical History  Procedure Laterality Date  . Tonsillectomy  1993   History  Substance Use Topics  . Smoking status: Current Every Day Smoker -- 0.25 packs/day for 5 years    Types: Cigarettes  . Smokeless tobacco: Never Used  . Alcohol Use: 1.2 oz/week    2 Cans of beer per week   ROS as above Medications: Current Outpatient Prescriptions  Medication Sig Dispense Refill  . Calcium Carbonate-Vitamin D (CALCIUM-VITAMIN D) 500-200 MG-UNIT per tablet Take 1 tablet by mouth daily.      Marland Kitchen EPINEPHrine 0.3 mg/0.3 mL IJ SOAJ injection Inject 0.3 mLs (0.3 mg total) into the muscle once. 1 Device 1  . escitalopram (LEXAPRO) 10 MG tablet As directed 30 tablet 6  . EVENING PRIMROSE OIL PO Take by mouth daily.    . Kava, Piper methysticum, (KAVA KAVA PO) Take 10 drops by mouth as needed.      . loratadine (CLARITIN) 10 MG tablet Take 10 mg by mouth daily.    Marland Kitchen LORazepam (ATIVAN) 0.5 MG tablet Take 1 tablet (0.5 mg total) by mouth every 8 (eight) hours as needed for anxiety. 30 tablet 0  . MAGNESIUM CARBONATE PO Take by mouth daily.    . Multiple Vitamin (MULTIVITAMIN WITH MINERALS) TABS Take 1 tablet by mouth daily.     . naproxen sodium (ANAPROX) 220 MG tablet Take 220 mg by mouth as needed.     . nicotine (NICODERM CQ - DOSED IN MG/24 HOURS) 14 mg/24hr patch Place 1 patch (14 mg total) onto the skin daily. 28 patch 0  . pseudoephedrine (SUDAFED) 30 MG tablet Take 30 mg by mouth every 4 (four) hours as needed.     No current facility-administered medications for this visit.   Allergies  Allergen Reactions  . Influenza Vaccines Anaphylaxis  . Iodine Anaphylaxis  . Chantix [Varenicline Tartrate] Other (See Comments)    Mood changes.   Vassie Loll Allergy] Itching  . Penicillins Hives  . Prednisone Other (See Comments)    Mood changes/extreme to the point of wanting to kill herself.   . Prozac [Fluoxetine Hcl]     Palpitations.  . Suboxone [Buprenorphine Hcl-Naloxone Hcl]   . Sulfa Antibiotics Hives     Exam:  BP 108/73 mmHg  Pulse 84  Wt 183 lb (83.008 kg) Gen: Well NAD HEENT: EOMI,  MMM Lungs: Normal work of breathing. CTABL Heart: RRR no MRG Abd: NABS, Soft. Nondistended, Nontender Exts: Brisk capillary refill, warm and well perfused.  Back: Nontender to midline or SI joint.  Normal ROM.  Negative straight leg raise test and  Faber test. Positive pain with figure-4 stretch right. Hip: Normal-appearing normal range of motion. Tender palpation right piriformis area. Reflexes sensation intact bilateral lower extremities. Normal gait. Foot: Normal-appearing tender palpation medial plantar calcaneus right  Please see individual assessment and plan.

## 2015-02-09 NOTE — Patient Instructions (Addendum)
Thank you for coming in today. Come back or go to the emergency room if you notice new weakness new numbness problems walking or bowel or bladder problems. Piriformis Syndrome with Rehab Piriformis syndrome is a condition the affects the nervous system in the area of the hip, and is characterized by pain and possibly a loss of feeling in the backside (posterior) thigh that may extend down the entire length of the leg. The symptoms are caused by an increase in pressure on the sciatic nerve by the piriformis muscle, which is on the back of the hip and is responsible for externally rotating the hip. The sciatic nerve and its branches connect to much of the leg. Normally the sciatic nerve runs between the piriformis muscle and other muscles. However, in certain individuals the nerve runs through the muscle, which causes an increase in pressure on the nerve and results in the symptoms of piriformis syndrome. SYMPTOMS   Pain, tingling, numbness, or burning in the back of the thigh that may also extend down the entire leg.  Occasionally, tenderness in the buttock.  Loss of function of the leg.  Pain that worsens when using the piriformis muscle (running, jumping, or stairs).  Pain that increases with prolonged sitting.  Pain that is lessened by lying flat on the back. CAUSES   Piriformis syndrome is the result of an increase in pressure placed on the sciatic nerve. Oftentimes, piriformis syndrome is an overuse injury.  Stress placed on the nerve from a sudden increase in the intensity, frequency, or duration of training.  Compensation of other extremity injuries. RISK INCREASES WITH:  Sports that involve the piriformis muscle (running, walking, or jumping).  You are born with (congenital) a defect in which the sciatic nerve passes through the muscle. PREVENTION  Warm up and stretch properly before activity.  Allow for adequate recovery between workouts.  Maintain physical  fitness:  Strength, flexibility, and endurance.  Cardiovascular fitness. PROGNOSIS  If treated properly, the symptoms of piriformis syndrome usually resolve in 2 to 6 weeks. RELATED COMPLICATIONS   Persistent and possibly permanent pain and numbness in the lower extremity.  Weakness of the extremity that may progress to disability and inability to compete. TREATMENT  The most effective treatment for piriformis syndrome is rest from any activities that aggravate the symptoms. Ice and pain medication may help reduce pain and inflammation. The use of strengthening and stretching exercises may help reduce pain with activity. These exercises may be performed at home or with a therapist. A referral to a therapist may be given for further evaluation and treatment, such as ultrasound. Corticosteroid injections may be given to reduce inflammation that is causing pressure to be placed on the sciatic nerve. If nonsurgical (conservative) treatment is unsuccessful, then surgery may be recommended.  MEDICATION   If pain medication is necessary, then nonsteroidal anti-inflammatory medications, such as aspirin and ibuprofen, or other minor pain relievers, such as acetaminophen, are often recommended.  Do not take pain medication for 7 days before surgery.  Prescription pain relievers may be given if deemed necessary by your caregiver. Use only as directed and only as much as you need.  Corticosteroid injections may be given by your caregiver. These injections should be reserved for the most serious cases, because they may only be given a certain number of times. HEAT AND COLD:   Cold treatment (icing) relieves pain and reduces inflammation. Cold treatment should be applied for 10 to 15 minutes every 2 to 3 hours for  inflammation and pain and immediately after any activity that aggravates your symptoms. Use ice packs or massage the area with a piece of ice (ice massage).  Heat treatment may be used prior  to performing the stretching and strengthening activities prescribed by your caregiver, physical therapist, or athletic trainer. Use a heat pack or soak the injury in warm water. SEEK IMMEDIATE MEDICAL CARE IF:  Treatment seems to offer no benefit, or the condition worsens.  Any medications produce adverse side effects. EXERCISES RANGE OF MOTION (ROM) AND STRETCHING EXERCISES - Piriformis Syndrome These exercises may help you when beginning to rehabilitate your injury. Your symptoms may resolve with or without further involvement from your physician, physical therapist, or athletic trainer. While completing these exercises, remember:   Restoring tissue flexibility helps normal motion to return to the joints. This allows healthier, less painful movement and activity.  An effective stretch should be held for at least 30 seconds.  A stretch should never be painful. You should only feel a gentle lengthening or release in the stretched tissue. STRETCH - Hip Rotators  Lie on your back on a firm surface. Grasp your right / left knee with your right / left hand and your ankle with your opposite hand.  Keeping your hips and shoulders firmly planted, gently pull your right / left knee and rotate your lower leg toward your opposite shoulder until you feel a stretch in your buttocks.  Hold this stretch for __________ seconds. Repeat this stretch __________ times. Complete this stretch __________ times per day. STRETCH - Iliotibial Band  On the floor or bed, lie on your side so your right / left leg is on top. Bend your knee and grab your ankle.  Slowly bring your knee back so that your thigh is in line with your trunk. Keep your heel at your buttocks and gently arch your back so your head, shoulders, and hips line up.  Slowly lower your leg so that your knee approaches the floor/bed until you feel a gentle stretch on the outside of your right / left thigh. If you do not feel a stretch and your knee  will not fall farther, place the heel of your opposite foot on top of your knee and pull your thigh down farther.  Hold this stretch for __________ seconds. Repeat __________ times. Complete __________ times per day. STRENGTHENING EXERCISES - Piriformis Syndrome  These are some of the caregiver again or until your symptoms are resolved. Remember:   Strong muscles with good endurance tolerate stress better.  Do the exercises as initially prescribed by your caregiver. Progress slowly with each exercise, gradually increasing the number of repetitions and weight used under their guidance. STRENGTH - Hip Abductors, Straight Leg Raises Be aware of your form throughout the entire exercise so that you exercise the correct muscles. Sloppy form means that you are not strengthening the correct muscles.  Lie on your side so that your head, shoulders, knee, and hip line up. You may bend your lower knee to help maintain your balance. Your right / left leg should be on top.  Roll your hips slightly forward, so that your hips are stacked directly over each other and your right / left knee is facing forward.  Lift your top leg up 4-6 inches, leading with your heel. Be sure that your foot does not drift forward or that your knee does not roll toward the ceiling.  Hold this position for __________ seconds. You should feel the muscles in your  outer hip lifting (you may not notice this until your leg begins to tire).  Slowly lower your leg to the starting position. Allow the muscles to fully relax before beginning the next repetition. Repeat __________ times. Complete this exercise __________ times per day.  STRENGTH - Hip Abductors, Quadruped  On a firm, lightly padded surface, position yourself on your hands and knees. Your hands should be directly below your shoulders and your knees should be directly below your hips.  Keeping your right / left knee bent, lift your leg out to the side. Keep your legs level  and in line with your shoulders.  Position yourself on your hands and knees.  Hold for __________ seconds.  Keeping your trunk steady and your hips level, slowly lower your leg to the starting position. Repeat __________ times. Complete this exercise __________ times per day.  STRENGTH - Hip Abductors, Standing  Tie one end of a rubber exercise band/tubing to a secure surface (table, pole) and tie a loop at the other end.  Place the loop around your right / left ankle. Keeping your ankle with the band directly opposite of the secured end, step away until there is tension in the tube/band.  Hold onto a chair as needed for balance.  Keeping your back upright, your shoulders over your hips, and your toes pointing forward, lift your right / left leg out to your side. Be sure to lift your leg with your hip muscles. Do not "throw" your leg or tip your body to lift your leg.  Slowly and with control, return to the starting position. Repeat exercise __________ times. Complete this exercise __________ times per day.  Document Released: 07/02/2005 Document Revised: 11/16/2013 Document Reviewed: 10/14/2008 [facility] Patient Information 2015 [place], [place]. This information is not intended to replace advice given to you by your health care provider. Make sure you discuss any questions you have with your health care provider.

## 2015-02-10 ENCOUNTER — Encounter: Payer: Self-pay | Admitting: Rehabilitative and Restorative Service Providers"

## 2015-02-10 ENCOUNTER — Ambulatory Visit (INDEPENDENT_AMBULATORY_CARE_PROVIDER_SITE_OTHER): Payer: 59 | Admitting: Rehabilitative and Restorative Service Providers"

## 2015-02-10 ENCOUNTER — Telehealth: Payer: Self-pay | Admitting: Family Medicine

## 2015-02-10 DIAGNOSIS — M2560 Stiffness of unspecified joint, not elsewhere classified: Secondary | ICD-10-CM

## 2015-02-10 DIAGNOSIS — Z7409 Other reduced mobility: Secondary | ICD-10-CM | POA: Diagnosis not present

## 2015-02-10 DIAGNOSIS — R29898 Other symptoms and signs involving the musculoskeletal system: Secondary | ICD-10-CM | POA: Diagnosis not present

## 2015-02-10 DIAGNOSIS — M25551 Pain in right hip: Secondary | ICD-10-CM | POA: Diagnosis not present

## 2015-02-10 LAB — COMPLETE METABOLIC PANEL WITH GFR
ALT: 11 U/L (ref 6–29)
AST: 18 U/L (ref 10–30)
Albumin: 4 g/dL (ref 3.6–5.1)
Alkaline Phosphatase: 37 U/L (ref 33–115)
BUN: 11 mg/dL (ref 7–25)
CALCIUM: 9.3 mg/dL (ref 8.6–10.2)
CHLORIDE: 103 meq/L (ref 98–110)
CO2: 27 mEq/L (ref 20–31)
Creat: 0.76 mg/dL (ref 0.50–1.10)
GFR, Est Non African American: >89 mL/min (ref >=60)
GLUCOSE: 93 mg/dL (ref 65–99)
Potassium: 3.9 mEq/L (ref 3.5–5.3)
Sodium: 140 mEq/L (ref 135–146)
TOTAL PROTEIN: 6.9 g/dL (ref 6.1–8.1)
Total Bilirubin: 0.4 mg/dL (ref 0.2–1.2)

## 2015-02-10 LAB — CBC
HCT: 42.1 % (ref 36.0–46.0)
Hemoglobin: 13.8 g/dL (ref 12.0–15.0)
MCH: 29.9 pg (ref 26.0–34.0)
MCHC: 32.8 g/dL (ref 30.0–36.0)
MCV: 91.3 fL (ref 78.0–100.0)
MPV: 10.6 fL (ref 8.6–12.4)
Platelets: 281 10*3/uL (ref 150–400)
RBC: 4.61 MIL/uL (ref 3.87–5.11)
RDW: 13.5 % (ref 11.5–15.5)
WBC: 7.5 10*3/uL (ref 4.0–10.5)

## 2015-02-10 LAB — HEPATITIS C RNA QUANTITATIVE: HCV QUANT: NOT DETECTED [IU]/mL (ref <15)

## 2015-02-10 LAB — HEPATITIS C GENOTYPE

## 2015-02-10 LAB — D-DIMER, QUANTITATIVE: D-Dimer, Quant: 0.35 ug/mL-FEU (ref 0.00–0.48)

## 2015-02-10 NOTE — Therapy (Signed)
[place], [place], [address] Phone: [phone]   Fax:  [phone]  Physical Therapy Evaluation  Patient Details  Name: Joanna Baker MRN: [id number] Date of Birth: 05/24/1971 Referring Provider:  Gregor Hams, MD  Encounter Date: 02/10/2015      PT End of Session - 02/10/15 1819    Visit Number 1   Number of Visits 12   Date for PT Re-Evaluation 03/24/15   PT Start Time 0418   PT Stop Time 0521   PT Time Calculation (min) 63 min   Activity Tolerance Patient tolerated treatment well;No increased pain      Past Medical History  Diagnosis Date  . IBS (irritable bowel syndrome)   . TB (tuberculosis), treated      6 month of INH at age 44  . ACL injury tear     Right knee, not repaired.    . Fatty liver   . Anxiety     Past Surgical History  Procedure Laterality Date  . Tonsillectomy  1993    There were no vitals filed for this visit.  Visit Diagnosis:  Pain, hip, right - Plan: PT plan of care cert/re-cert  Stiffness in joint - Plan: PT plan of care cert/re-cert  Weakness of right hip - Plan: PT plan of care cert/re-cert  Decreased functional mobility and endurance - Plan: PT plan of care cert/re-cert      Subjective Assessment - 02/10/15 1626    Subjective Patient reports pain in the Rt leg - throbbing in leg and swelling in the foot. Hit posterior thight on chair ~ 1 month ago.. Continues to have pain in the Rt leg, pulsing in the heel. Seen by MD yesterday.    Pertinent History Rt ACL tear 2008; LB injury at 44 yr old resolved with time - episodic flare up of symptoms ~ once a year to every two years    How long can you sit comfortably? 10 min when flared up    How long can you stand comfortably? 10 min when flared up    Patient Stated Goals Get rid of pain    Pain Score 1    Pain Location Leg   Pain Orientation Right;Posterior   Pain Descriptors / Indicators --  deep   Pain Type Acute pain   Pain Radiating Towards towards heel - can radiate up through the hip - aching   Pain Onset More than a month ago   Pain Frequency Intermittent   Aggravating Factors  unknown   Pain Relieving Factors elevating leg; aleve   Effect of Pain on Daily Activities changes activity level            [facility] PT Assessment - 02/10/15 0001    Assessment   Medical Diagnosis Rt hip pain/LE pain    Onset Date/Surgical Date 11/29/14   Hand Dominance Right   Next MD Visit no return scheduled   Balance Screen   Has the patient fallen in the past 6 months No   Has the patient had a decrease in activity level because of a fear of falling?  No   Is the patient reluctant to leave their home because of a fear of falling?  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; laundry/dishes/gardening   Observation/Other Assessments   Focus on Therapeutic Outcomes (FOTO)  27% limitation   Sensation   Additional Comments WFL to touch   Posture/Postural Control   Posture Comments stands with LE's ER; knees hyperextrended; increaesd lumbar lordosis   AROM   Right/Left Hip --  HS tightness Rt 90 d/Lt 100; tight figure 4/piriformis Rt   Lumbar Flexion 100   Lumbar Extension 80   Lumbar - Right Side Bend 85   Lumbar - Left Side Bend 80   Lumbar - Right Rotation 85   Lumbar - Left Rotation 85   Strength   Overall Strength Comments Bilat LE's WFL except Rt hip extension 4+/5; abd 5-/5   Palpation   Palpation comment painful/tight through QL/lats/piriformis/hip abductors/hip flexors Rt                           PT  Education - 02/10/15 1813    Education provided Yes   Education Details Education re musculoskeletal dysfunction; positioning; core stabilization; stretching   Person(s) Educated Patient   Methods Explanation;Demonstration;Tactile cues;Verbal cues;Handout   Comprehension Verbalized understanding;Returned demonstration;Verbal cues required;Tactile cues required          PT Short Term Goals - 02/10/15 1829    PT SHORT TERM GOAL #1   Title Patient I in initial HEP 03/03/15   Time 3   Period Weeks   Status New   PT SHORT TERM GOAL #2   Title Patient reports sitting without crossing legs at work station and home office 03/03/15   Time 3   Period Weeks   Status New   PT SHORT TERM GOAL #3   Title Increase hamstring flexibility on Rt to equal Lt 03/03/15   Time 3   Period Weeks   Status New           PT Long Term Goals - 02/10/15 1831    PT LONG TERM GOAL #1   Title Patient I in HEP for discharge 03/24/15   Time 6   Period Weeks   Status New   PT LONG TERM GOAL #2   Title Strength Rt LE 5/5 03/24/15   Time 6   Period Weeks   Status New   PT LONG TERM GOAL #3   Title Decrease FOTO score to </=21% limitation 03/24/15   Time 6   Period Weeks   Status New               Plan - 02/10/15 1821    Clinical Impression Statement Patient presents with c/o pain and tightness in the Rt hip/thigh/leg with symptoms radiating into the foot at times. Symptoms have been present for the past few weeks and are intermittent in nature. There is no known cause of injury. Patient has tenderness and tightness to palpation through the Rt QL; lats; piriformis; hip abductors; hamstrings; SI joint area; hip flexors. Patient sits with legs crossed for yoga and meditation, sits over on to one hip and with legs crossed for work at her desk. Stresses hip joints and shortens musculature through hip flexors and piriformis.    Pt will benefit from skilled therapeutic intervention in order to improve  on the following deficits Pain;Decreased range of motion;Decreased strength;Decreased activity tolerance;Postural dysfunction;Improper body mechanics   Rehab Potential Good   PT Frequency 2x /  week   PT Duration 6 weeks   PT Treatment/Interventions Patient/family education;ADLs/Self Care Home Management;Therapeutic exercise;Therapeutic activities;Manual techniques;Neuromuscular re-education;Cryotherapy;Electrical Stimulation;Moist Heat;Traction;Ultrasound;Dry needling   PT Next Visit Plan Review exercises; add hip flexor stretch; trial of supine piriformis stretch; core stabilization; manual work through [place] trunk into piriformis/hip; trial of e-stim as indicated.   PT Home Exercise Plan quad stretch; hamstring stretch; 3 part core; education to avoid crossing legs.    Consulted and Agree with Plan of Care Patient         Problem List Patient Active Problem List   Diagnosis Date Noted  . Right leg pain 02/09/2015  . Premenstrual symptom 11/07/2014  . Depression 12/11/2013  . Anxiety state, unspecified 12/11/2013  . Fatty liver disease, nonalcoholic 28/20/6015  . Right lumbar radiculitis 01/13/2013  . Hemorrhoid 08/03/2011    Romari Gasparro Nilda Simmer, PT, MPH 02/10/2015, 6:36 PM  [facility] [place], [place], [address] Phone: [phone]   Fax:  [phone]

## 2015-02-10 NOTE — Progress Notes (Signed)
Quick Note:  Normal, no changes. Pt still needs to get that back xray. She can come by today or tomorrow for it. ______

## 2015-02-10 NOTE — Patient Instructions (Signed)
Abdominal Bracing With Pelvic Floor (Hook-Lying)   With neutral spine, tighten pelvic floor and abdominals, tighten muscles at waist.Hold 10 sec Repeat _10__ times. Do _several__ times a day. Progress to doing this exercise in sitting and standing.Hamstring Step 2   Left foot relaxed, knee straight, other leg bent, foot flat. Raise straight leg. Hold __30_ seconds. Relax leg completely down. Repeat _3__ times.  KNEE: Quadriceps - Prone   Place strap around ankle. Bring ankle toward buttocks. Press hip into surface. Hold _30__ seconds.3 reps 2-3 x/day   DO NOT SIT WITH LEGS CROSSED AT ALL!!!!

## 2015-02-10 NOTE — Telephone Encounter (Signed)
Called and left a message. HCV tests are negative.

## 2015-02-10 NOTE — Telephone Encounter (Signed)
Discussed patient results of hepatitis C test. Letter written and labs printed.

## 2015-02-17 ENCOUNTER — Encounter: Payer: Self-pay | Admitting: Family Medicine

## 2015-02-21 ENCOUNTER — Encounter: Payer: Self-pay | Admitting: Rehabilitative and Restorative Service Providers"

## 2015-02-21 ENCOUNTER — Ambulatory Visit (INDEPENDENT_AMBULATORY_CARE_PROVIDER_SITE_OTHER): Payer: 59 | Admitting: Rehabilitative and Restorative Service Providers"

## 2015-02-21 DIAGNOSIS — M25551 Pain in right hip: Secondary | ICD-10-CM

## 2015-02-21 DIAGNOSIS — M2560 Stiffness of unspecified joint, not elsewhere classified: Secondary | ICD-10-CM

## 2015-02-21 DIAGNOSIS — R29898 Other symptoms and signs involving the musculoskeletal system: Secondary | ICD-10-CM | POA: Diagnosis not present

## 2015-02-21 DIAGNOSIS — Z7409 Other reduced mobility: Secondary | ICD-10-CM | POA: Diagnosis not present

## 2015-02-21 NOTE — Patient Instructions (Signed)
Quads / HF, Supine   Lie near edge of bed, bring both knees to chest. Hold one leg bent, lower opposite knee off the edge of the bed, hanging over edge, relaxed,  Hold _30__ seconds.  Repeat _3 times per session. Do _2-3__ sessions per day.  Bridge   Lie back, legs bent. Inhale, 3 part core. Lift hips. Hold 3-10 sec Repeat __10__ times. Do _2___ sessions per day.     Combination (Hook-Lying)   Tighten core and slowly raise left leg and lower opposite arm over head. Keep trunk rigid. Repeat _10___ times per set. Do _1-3___ sets per session. Do __1__ sessions per day.  Back Wall Slide   With feet __8-10__ inches from wall, lean as much of back against the wall as possible. Gently squat down _8-10__ inches, keeping back against wall.  Hold _10-30___ seconds while counting out loud. Repeat __10__ times. Do __1__ sessions per day.

## 2015-02-21 NOTE — Therapy (Addendum)
[place], [place], [address] Phone: [phone]   Fax:  [phone]  Physical Therapy Treatment  Patient Details  Name: Joanna Baker MRN: [id number] Date of Birth: 1971/04/07 Referring Provider:  Gregor Hams, MD  Encounter Date: 02/21/2015      PT End of Session - 02/21/15 1652    Visit Number 2   Number of Visits 12   Date for PT Re-Evaluation 03/24/15   PT Start Time 0407   PT Stop Time 0459   PT Time Calculation (min) 52 min   Activity Tolerance Patient tolerated treatment well;No increased pain      Past Medical History  Diagnosis Date  . IBS (irritable bowel syndrome)   . TB (tuberculosis), treated      6 month of INH at age 30  . ACL injury tear     Right knee, not repaired.    . Fatty liver   . Anxiety     Past Surgical History  Procedure Laterality Date  . Tonsillectomy  1993    There were no vitals filed for this visit.  Visit Diagnosis:  Pain, hip, right  Stiffness in joint  Weakness of right hip  Decreased functional mobility and endurance      Subjective Assessment - 02/21/15 1612    Subjective patient reports that she has been doing exercises and working to avoid crossing legs and sitting on legs. Symptoms are improved. She has not had swelling in foot/ankle; less pulsing into the Rt heel. Good improvement overall.   Currently in Pain? No/denies                         [facility] Adult PT Treatment/Exercise - 02/21/15 0001    Lumbar Exercises: Stretches   Passive Hamstring Stretch 3 reps;30 seconds   Quad Stretch 3 reps;30 seconds   Piriformis Stretch 3 reps;20 seconds   Piriformis Stretch Limitations increased tightness through hip and groin    Lumbar Exercises: Standing   Wall Slides 10 reps  10 sec hold    Lumbar Exercises: Supine   Ab Set 10 reps  10 sec hold - 3 part core   Dead Bug 10 reps;3 seconds  arms and legs   Bridge 10  reps;3 seconds   Cryotherapy   Number Minutes Cryotherapy 15 Minutes   Cryotherapy Location Lumbar Spine;Hip  Rt   Type of Cryotherapy Ice pack   Electrical Stimulation   Electrical Stimulation Location Bilat L-sine; Rt hip   Electrical Stimulation Action IFC   Electrical Stimulation Parameters to tolerance   Electrical Stimulation Goals Pain   Manual Therapy   Soft tissue mobilization deep tissue work through Therapist, nutritional area; Rt piriformis/hip abductors pt prone                PT Education - 02/21/15 1643    Education provided Yes   Education Details encouraged patient to continue working on consistent HEP; review of HEP; added stretching and lumbar stabilization   Person(s) Educated Patient   Methods Explanation;Demonstration;Tactile cues;Verbal cues;Handout   Comprehension Verbalized understanding;Returned demonstration;Verbal cues required          PT Short Term Goals - 02/21/15 1659    PT SHORT TERM GOAL #1   Title Patient I in initial HEP 03/03/15   Time 3   Period Weeks   Status On-going   PT SHORT TERM GOAL #2   Title Patient reports sitting without crossing  legs at work station and home office 03/03/15   Time 3   Period Weeks   Status On-going   PT SHORT TERM GOAL #3   Title Increase hamstring flexibility on Rt to equal Lt 03/03/15   Time 3   Period Weeks   Status On-going           PT Long Term Goals - 02/21/15 1659    PT LONG TERM GOAL #1   Title Patient I in HEP for discharge 03/24/15   Time 6   Period Weeks   Status On-going   PT LONG TERM GOAL #2   Title Strength Rt LE 5/5 03/24/15   Time 6   Period Weeks   Status On-going   PT LONG TERM GOAL #3   Title Decrease FOTO score to </=21% limitation 03/24/15   Time 6   Period Weeks   Status On-going               Plan - 02/21/15 1653    Clinical Impression Statement Patient presents with report of decreased symptoms. She has increased HS and quad flexibility and decreased  pain. She continues to have tenderness to palpation through sacrum/bilat SI/Rt piriformis and hip abductors. Addded hip flexor stretch and core stabilization.    Pt will benefit from skilled therapeutic intervention in order to improve on the following deficits Pain;Decreased range of motion;Decreased strength;Decreased activity tolerance;Postural dysfunction;Improper body mechanics   Rehab Potential Good   PT Frequency 2x / week   PT Duration 6 weeks   PT Treatment/Interventions Patient/family education;ADLs/Self Care Home Management;Therapeutic exercise;Therapeutic activities;Manual techniques;Neuromuscular re-education;Cryotherapy;Electrical Stimulation;Moist Heat;Traction;Ultrasound;Dry needling   PT Next Visit Plan Review exercises; core stabilization; manual work through Rt lateral trunk into piriformis/hip; trial of e-stim as indicated.   PT Home Exercise Plan stretching/core stabilization   Consulted and Agree with Plan of Care Patient        Problem List Patient Active Problem List   Diagnosis Date Noted  . Right leg pain 02/09/2015  . Premenstrual symptom 11/07/2014  . Depression 12/11/2013  . Anxiety state, unspecified 12/11/2013  . Fatty liver disease, nonalcoholic 20/80/2233  . Right lumbar radiculitis 01/13/2013  . Hemorrhoid 08/03/2011    Roben Tatsch Nilda Simmer, PT, MPH 02/21/2015, 5:01 PM  [facility] [place], [place], [address] Phone: [phone]   Fax:  [phone]     PHYSICAL THERAPY DISCHARGE SUMMARY  Visits from Start of Care:2  Current functional level related to goals / functional outcomes: Good improvement in symptoms with therapy and HEP.   Remaining deficits: Continued pain and tightness   Education / Equipment: HEP  Plan: Patient agrees to discharge.  Patient goals were partially met. Patient is being discharged due to not returning since the last visit.  ?????     Bodhi Moradi P.  Helene Kelp PT, MPH 04/13/2015 7:17 AM

## 2015-02-28 ENCOUNTER — Other Ambulatory Visit: Payer: Self-pay | Admitting: Family Medicine

## 2015-03-02 ENCOUNTER — Other Ambulatory Visit: Payer: Self-pay

## 2015-03-02 MED ORDER — LORAZEPAM 0.5 MG PO TABS: 0.5000 mg | ORAL_TABLET | Freq: Three times a day (TID) | ORAL | Status: DC | PRN

## 2015-03-23 ENCOUNTER — Encounter: Payer: Self-pay | Admitting: Physician Assistant

## 2015-03-23 ENCOUNTER — Ambulatory Visit (INDEPENDENT_AMBULATORY_CARE_PROVIDER_SITE_OTHER): Payer: 59 | Admitting: Physician Assistant

## 2015-03-23 VITALS — BP 98/60 | HR 85 | Ht 67.0 in | Wt 185.0 lb

## 2015-03-23 DIAGNOSIS — M79604 Pain in right leg: Secondary | ICD-10-CM | POA: Diagnosis not present

## 2015-03-23 DIAGNOSIS — N943 Premenstrual tension syndrome: Secondary | ICD-10-CM | POA: Diagnosis not present

## 2015-03-23 MED ORDER — LORAZEPAM 0.5 MG PO TABS: 0.5000 mg | ORAL_TABLET | Freq: Three times a day (TID) | ORAL | Status: DC | PRN

## 2015-03-27 NOTE — Progress Notes (Signed)
   Subjective:    Patient ID: Joanna Baker, female    DOB: 1971-06-19, 44 y.o.   MRN: [id number]  HPI Pt presents to the clinic for medication refill.   PMS- pt shows calender and only taking ativan 1-2 weeks around her cycle a month. Dr. Hulan Fray gave lexapro to take 1-2 weeks a month but she did not like it. She does feel like significantly helps to take ativan.   Continues to have right leg pain that radiates into buttocks. Never got xray or went to PT. Home exercises helps some.    Review of Systems  All other systems reviewed and are negative.      Objective:   Physical Exam  Constitutional: She is oriented to person, place, and time. She appears well-developed and well-nourished.  Cardiovascular: Normal rate, regular rhythm and normal heart sounds.   Pulmonary/Chest: Effort normal and breath sounds normal.  Neurological: She is alert and oriented to person, place, and time.  Psychiatric: She has a normal mood and affect. Her behavior is normal.          Assessment & Plan:  Pre-menstrual syndrome- refilled ativan as needed during 1-2 weeks of pre-menstrual syndrome.   Right leg pain- improved with home exercises encouraged to continue. Reordered lumbar xrays. Discussed NSAID as needed. Encouraged pt to call back PT to get started.

## 2015-04-04 ENCOUNTER — Other Ambulatory Visit: Payer: Self-pay

## 2015-04-04 DIAGNOSIS — Z1231 Encounter for screening mammogram for malignant neoplasm of breast: Secondary | ICD-10-CM

## 2015-04-08 ENCOUNTER — Ambulatory Visit (INDEPENDENT_AMBULATORY_CARE_PROVIDER_SITE_OTHER): Payer: 59

## 2015-04-08 DIAGNOSIS — M79604 Pain in right leg: Secondary | ICD-10-CM | POA: Diagnosis not present

## 2015-04-26 ENCOUNTER — Ambulatory Visit
Admission: RE | Admit: 2015-04-26 | Discharge: 2015-04-26 | Disposition: A | Discharge status: Home / Self Care | Payer: 59 | Source: Ambulatory Visit

## 2015-04-26 DIAGNOSIS — Z1231 Encounter for screening mammogram for malignant neoplasm of breast: Secondary | ICD-10-CM

## 2015-04-28 ENCOUNTER — Ambulatory Visit (INDEPENDENT_AMBULATORY_CARE_PROVIDER_SITE_OTHER): Payer: 59 | Admitting: Obstetrics & Gynecology

## 2015-04-28 ENCOUNTER — Encounter: Payer: Self-pay | Admitting: Obstetrics & Gynecology

## 2015-04-28 VITALS — BP 96/64 | HR 76 | Resp 16 | Ht 67.0 in | Wt 184.0 lb

## 2015-04-28 DIAGNOSIS — N938 Other specified abnormal uterine and vaginal bleeding: Secondary | ICD-10-CM

## 2015-04-28 NOTE — Progress Notes (Signed)
   Subjective:    Patient ID: Joanna Baker, female    DOB: 01-18-1971, 44 y.o.   MRN: [id number]  HPI 56 here today with the issue of DUB. She has been bleeding since 04/14/15, rather heavy. She is eating spinach and iron to combat this loss.   Review of Systems No exposure to  sperm    Objective:   Physical Exam WNWHWFNAD Breathing, conversing, and ambulating normally Cervix with no lesions, a small amount of blood seen       Assessment & Plan:  DUB- check HBG, TSH, gyn u/s Declines flu vaccine

## 2015-04-29 ENCOUNTER — Telehealth: Payer: Self-pay | Admitting: *Deleted

## 2015-04-29 LAB — TSH: TSH: 1.422 u[IU]/mL (ref 0.350–4.500)

## 2015-04-29 LAB — CBC
HEMATOCRIT: 41.5 % (ref 36.0–46.0)
HEMOGLOBIN: 13.3 g/dL (ref 12.0–15.0)
MCH: 29.3 pg (ref 26.0–34.0)
MCHC: 32 g/dL (ref 30.0–36.0)
MCV: 91.4 fL (ref 78.0–100.0)
MPV: 10.6 fL (ref 8.6–12.4)
Platelets: 226 10*3/uL (ref 150–400)
RBC: 4.54 MIL/uL (ref 3.87–5.11)
RDW: 13.3 % (ref 11.5–15.5)
WBC: 7.2 10*3/uL (ref 4.0–10.5)

## 2015-04-29 NOTE — Telephone Encounter (Signed)
Pt notified of normal CBC and TSH

## 2015-05-03 ENCOUNTER — Ambulatory Visit (INDEPENDENT_AMBULATORY_CARE_PROVIDER_SITE_OTHER): Payer: 59

## 2015-05-03 DIAGNOSIS — N938 Other specified abnormal uterine and vaginal bleeding: Secondary | ICD-10-CM

## 2015-05-03 DIAGNOSIS — D251 Intramural leiomyoma of uterus: Secondary | ICD-10-CM

## 2015-05-09 ENCOUNTER — Ambulatory Visit (INDEPENDENT_AMBULATORY_CARE_PROVIDER_SITE_OTHER): Payer: 59 | Admitting: Obstetrics & Gynecology

## 2015-05-09 ENCOUNTER — Encounter: Payer: Self-pay | Admitting: Obstetrics & Gynecology

## 2015-05-09 VITALS — BP 102/63 | HR 87 | Resp 16 | Ht 67.0 in | Wt 184.0 lb

## 2015-05-09 DIAGNOSIS — R1031 Right lower quadrant pain: Secondary | ICD-10-CM | POA: Diagnosis not present

## 2015-05-09 DIAGNOSIS — N938 Other specified abnormal uterine and vaginal bleeding: Secondary | ICD-10-CM | POA: Diagnosis not present

## 2015-05-09 NOTE — Progress Notes (Signed)
   Subjective:    Patient ID: Joanna Baker, female    DOB: 1971/07/04, 44 y.o.   MRN: [id number]  HPI  44 yo W G0 is here today to follow up with regard to her u/s, tsh , and cbc. Her u/s showed a 1.7 cm myometrial fibroid. The rest was normal.  In addition to her issue of DUB she also has pain, mainly in her right pelvis. It is not strictly related to her period. She takes Aleve for the pain. She also has some low level lower back pain.  Review of Systems Of note when she eats cheese she gets a flare up of this pain.    Objective:   Physical Exam WNWHWFNAD Teary when I told her that her fibroid does not need to be removed. She is very worried that it will grow. I offered to follow up with u/s prn      Assessment & Plan:  DUB- discussed endometrial ablation versus medical management (cyclic progesterone, OCPs) RLQ pain- I suspect a GI etiology, not gyn.

## 2015-05-18 ENCOUNTER — Encounter (HOSPITAL_COMMUNITY): Payer: Self-pay | Admitting: *Deleted

## 2015-05-31 ENCOUNTER — Telehealth: Payer: Self-pay

## 2015-05-31 NOTE — Telephone Encounter (Signed)
Pt called wanted to let Dr. Hulan Fray know that her bleeding stop on oct 31st and should she still have surgery. Dr. Hulan Fray wanted me to advise patient that she does not need to have surgery and to call to make appointment if she is having any problems. Patient is aware and ok with advise.

## 2015-06-21 ENCOUNTER — Encounter (HOSPITAL_COMMUNITY): Admission: RE | Payer: Self-pay | Source: Ambulatory Visit

## 2015-06-21 ENCOUNTER — Ambulatory Visit (HOSPITAL_COMMUNITY): Admission: RE | Admit: 2015-06-21 | Payer: 59 | Source: Ambulatory Visit | Admitting: Obstetrics & Gynecology

## 2015-06-21 SURGERY — DILATATION & CURETTAGE/HYSTEROSCOPY WITH HYDROTHERMAL ABLATION
Anesthesia: Choice | Site: Vagina

## 2015-07-08 ENCOUNTER — Encounter: Payer: Self-pay | Admitting: Family Medicine

## 2015-07-08 ENCOUNTER — Ambulatory Visit (INDEPENDENT_AMBULATORY_CARE_PROVIDER_SITE_OTHER): Payer: 59 | Admitting: Family Medicine

## 2015-07-08 VITALS — BP 101/57 | HR 69 | Wt 190.0 lb

## 2015-07-08 DIAGNOSIS — S8992XA Unspecified injury of left lower leg, initial encounter: Secondary | ICD-10-CM

## 2015-07-08 NOTE — Patient Instructions (Signed)
Thank you for coming in today. Get MRI.  Return a few days following MRI.  Make sure to bring the image CD.  Use the knee immobilizer and crutches as needed.  Return sooner if needed.

## 2015-07-08 NOTE — Progress Notes (Signed)
NOTES FAXED TO [phone]

## 2015-07-08 NOTE — Assessment & Plan Note (Signed)
Concerning for anterior cruciate ligament versus meniscus injury. Patient was given a hinged knee brace. Plan for MRI. Return following MRI.

## 2015-07-08 NOTE — Progress Notes (Signed)
   Subjective:    I'm seeing this patient as a consultation for:  Christie Nottingham, PA at [facility] ED and Dr Madilyn Fireman PCP.   CC: Left Knee Injury  HPI: Patient fell from a ladder on December 19 approximately 6-10 feet. She felt a pop in her left knee. She was evaluated in the emergency department at [facility] where x-ray of her knee was negative. She was given crutches and a knee immobilizer and instructions to follow up with orthopedics or sports medicine. She notes the swelling continues however pain is diminished. Pain is diffuse. She notes pain is worse with ambulation. No radiating pain weakness or numbness fevers or chills.  Past medical history, Surgical history, Family history not pertinant except as noted below, Social history, Allergies, and medications have been entered into the medical record, reviewed, and no changes needed.   Review of Systems: No headache, visual changes, nausea, vomiting, diarrhea, constipation, dizziness, abdominal pain, skin rash, fevers, chills, night sweats, weight loss, swollen lymph nodes, body aches, joint swelling, muscle aches, chest pain, shortness of breath, mood changes, visual or auditory hallucinations.   Objective:    Filed Vitals:   07/08/15 1033  BP: 101/57  Pulse: 69   General: Well Developed, well nourished, and in no acute distress.  Neuro/Psych: Alert and oriented x3, extra-ocular muscles intact, able to move all 4 extremities, sensation grossly intact. Skin: Warm and dry, no rashes noted.  Respiratory: Not using accessory muscles, speaking in full sentences, trachea midline.  Cardiovascular: Pulses palpable, no extremity edema. Abdomen: Does not appear distended. MSK: Knee moderate effusion.  Tender to palpation lateral joint line. Range of motion 0-90. Patient has intact extension and flexion strength. Stable ligamentous testing valgus and varus stress. No firm endpoint with Lachman's and anterior drawer. Positive firm endpoint with  posterior drawer testing. Positive McMurray testing.  ED report from [facility] 07/04/15 reviewed.   No results found for this or any previous visit (from the past 24 hour(s)). No results found.  Impression and Recommendations:   This case required medical decision making of moderate complexity.

## 2015-07-15 ENCOUNTER — Telehealth: Payer: Self-pay | Admitting: Family Medicine

## 2015-07-15 NOTE — Telephone Encounter (Signed)
Patient called to see if Dr. Georgina Snell read her Mri results before the weekend approaches I adv pt I would send a phone message. Thanks

## 2015-07-15 NOTE — Telephone Encounter (Signed)
Called pt and advised that Dr. Georgina Snell will go over MRI results at her 07/18/2014 office visit.

## 2015-07-19 ENCOUNTER — Ambulatory Visit (INDEPENDENT_AMBULATORY_CARE_PROVIDER_SITE_OTHER): Payer: BLUE CROSS/BLUE SHIELD | Admitting: Family Medicine

## 2015-07-19 ENCOUNTER — Encounter: Payer: Self-pay | Admitting: Family Medicine

## 2015-07-19 VITALS — BP 112/61 | HR 86 | Wt 186.0 lb

## 2015-07-19 DIAGNOSIS — S83519A Sprain of anterior cruciate ligament of unspecified knee, initial encounter: Secondary | ICD-10-CM | POA: Insufficient documentation

## 2015-07-19 DIAGNOSIS — S83512A Sprain of anterior cruciate ligament of left knee, initial encounter: Secondary | ICD-10-CM | POA: Diagnosis not present

## 2015-07-19 NOTE — Patient Instructions (Addendum)
Thank you for coming in today. You were seen for your L ACL tear. We will refer you for physical therapy to strengthen the muscles around your knee for better stabilization.  Please return in 2-3 weeks with your MRI images on CD and to see improvement with PT.     Anterior Cruciate Ligament Tear With Rehab The anterior cruciate ligament (ACL) of the knee is one of the four major ligaments of the knee. The ACL is responsible for preventing the shinbone (tibia) from moving too far forward (anteriorly) in relation to the thigh bone (femur). An ACL tear (sprain) is a common injury for athletes. The ACL is most important for sports in which pivoting, cutting (changing direction), or jumping and landing is necessary. In general, ligaments do not heal well, and these injuries often require surgery. Approximately 50% of people who tear their ACL also tear the cartilage of their meniscus at the same time. SYMPTOMS   "Pop" or tear heard or felt at the time of injury.  An inability to continue playing after the injury.  Swelling of the knee within 48 hours.  Inability to straighten knee.  Knee giving way or buckling, particularly when trying to pivot, cut (rapidly change direction), or jump.  Occasionally, locking when there is concurrent injury to the meniscus cartilage. CAUSES  An ACL tear occurs when the ligament is subjected to a greater force than it can withstand. ACL tears commonly occur from contact (being tackled at the knee) or non-contact (i.e., landing awkwardly) events. RISK INCREASES WITH:  Sports that require pivoting, jumping, cutting, or changing direction (i.e., basketball, soccer, or volleyball) or contact sports. (football, rugby, or lacrosse).  Poor strength and flexibility.  Women tend to be at a higher risk than men.  Improperly fitted or padded equipment. PREVENTION   Warm up and stretch properly before activity.  Maintain physical fitness:  Thigh, leg, and knee  flexibility.  Muscle strength and endurance.  Cardiovascular fitness.  Learn and use proper technique.  Wear properly fitted equipment (appropriate length of cleats for surface). PROGNOSIS  ACL tears do not heal on their own. However, most people will be able to perform activities of daily living after completing a rehabilitation program. For individuals who desire to return to activities that require pivoting, cutting, or jumping and landing, surgery is usually required. RELATED COMPLICATIONS   Frequent recurrence of symptoms, such as knee giving way, instability, and swelling.  Meniscus injury, which may cause locking and swelling of the knee.  Injury to other structures of the knee.  Arthritis of the knee.  Knee stiffness (loss of knee motion). TREATMENT Treatment initially involves ice and pain medication to help reduce pain and inflammation. It is often recommended that you walk with crutches until your knee will allow walking without a limp. Rehabilitation programs that involve strengthening and stretching exercises to regain strength and a full range of motion are necessary to regain proper functioning of the knee. These exercises may be completed at home or with a therapist. Your caregiver may give you a knee brace to help support the unstable joint. Rehabilitation programs also will educate you on how to avoid further injury to the joint. If you want to return to sports involving pivoting, cutting, or jumping and landing, then surgery is necessary to replace (reconstruct) the torn ligament. MEDICATION  If pain medication is necessary, then nonsteroidal anti-inflammatory medications, such as aspirin and ibuprofen, or other minor pain relievers, such as acetaminophen, are often recommended.  Do  not take pain medication within 7 days before surgery.  Prescription pain relievers may be given if deemed necessary by your caregiver. Use only as directed and only as much as you  need. HEAT AND COLD  Cold treatment (icing) relieves pain and reduces inflammation. Cold treatment should be applied for 10 to 15 minutes every 2 to 3 hours for inflammation and pain and immediately after any activity that aggravates your symptoms. Use ice packs or an ice massage.  Heat treatment may be used prior to performing the stretching and strengthening activities prescribed by your caregiver, physical therapist, or athletic trainer. Use a heat pack or a warm soak. SEEK MEDICAL CARE IF:   Symptoms get worse or do not improve in 6 weeks despite treatment.  New, unexplained symptoms develop. (Drugs used in treatment may produce side effects). EXERCISES  RANGE OF MOTION (ROM) AND STRETCHING EXERCISES - Anterior Cruciate Ligament Tear These exercises may help you when beginning to rehabilitate your injury. Your symptoms may resolve with or without further involvement from your physician, physical therapist or athletic trainer. While completing these exercises, remember:  Restoring tissue flexibility helps normal motion to return to the joints. This allows healthier, less painful movement and activity.  An effective stretch should be held for at least 30 seconds.  A stretch should never be painful. You should only feel a gentle lengthening or release in the stretched tissue. RANGE OF MOTION - Knee Flexion, Active  Lie on your back with both knees straight. (If this causes back discomfort, bend your opposite knee, placing your foot flat on the floor.)  Slowly slide your heel back toward your buttocks until you feel a gentle stretch in the front of your knee or thigh.  Hold for __________ seconds. Slowly slide your heel back to the starting position. Repeat __________ times. Complete this exercise __________ times per day. STRETCH - Knee Flexion, Supine  Lie on the floor with your right / left heel/foot lightly touching the wall (place both feet on the wall if you do not use a door  frame).  Without using any effort, allow gravity to slide your foot down the wall slowly until you feel a gentle stretch in the front of your right / left knee.  Hold this stretch for __________ seconds. Then return the leg to the starting position, using your healthy leg for help, if needed. Repeat __________ times. Complete this stretch __________ times per day. RANGE OF MOTION - Knee Flexion and Extension, Active-Assisted  Sit on the edge of a table or chair with your thighs firmly supported. It may be helpful to place a folded towel under the end of your right / left thigh.  Flexion (bending): Place the ankle of your healthy leg on top of the other ankle. Use your healthy leg to gently bend your right / left knee until you feel a mild tension across the top of your knee.  Hold for __________ seconds.  Extension (straightening): Switch your ankles so your right / left leg is on top. Use your healthy leg to straighten your right / left knee until you feel a mild tension on the backside of your knee.  Hold for __________ seconds. Repeat __________ times. Complete this exercise __________ times per day. STRETCH - Knee Extension Sitting  Sit with your right / left leg/heel propped on another chair, coffee table or foot stool.  Allow your leg muscles to relax, letting gravity straighten out your knee.*  You should feel a stretch  behind your right / left knee. Hold this position for __________ seconds. Repeat __________ times. Complete this stretch __________ times per day. *Your physician, physical therapist or athletic trainer may instruct you to place a __________ weight on your thigh just above your kneecap to deepen the stretch. STRETCH - Knee Extension, Prone  Lie on your stomach on a firm surface, such as a bed or countertop. Place your right / left knee and leg just beyond the edge of the surface. You may wish to place a towel under the far end of your thigh for comfort.  Relax  your leg muscles and allow gravity to straighten your knee. Your clinician may advise you to add an ankle weight if more resistance is helpful for you.  You should feel a stretch in the back of your right / left knee. Hold this position for __________ seconds. Repeat __________ times. Complete this __________ times per day. *Your physician, physical therapist or athletic trainer may instruct you to place a __________ weight on your ankle to deepen the stretch. STRENGTHENING EXERCISES - Anterior Cruciate Ligament Tear These exercises may help you when beginning to rehabilitate your injury. They may resolve your symptoms with or without further involvement from your physician, physical therapist or athletic trainer. While completing these exercises, remember:  Muscles can gain both the endurance and the strength needed for everyday activities through controlled exercises.  Complete these exercises as instructed by your physician, physical therapist or athletic trainer. Progress the resistance and repetitions only as guided.  You may experience muscle soreness or fatigue, but the pain or discomfort you are trying to eliminate should never worsen during these exercises. If this pain does worsen, stop and make certain you are following the directions exactly. If the pain is still present after adjustments, discontinue the exercise until you can discuss the trouble with your clinician. STRENGTH - Quadriceps, Isometrics  Lie on your back with your right / left leg extended and your opposite knee bent.  Gradually tense the muscles in the front of your thigh. You should see either your knee cap slide up toward your hip or increased dimpling just above the knee. This motion will push the back of the knee down toward the floor/mat/bed on which you are lying.  Hold the muscle as tight as you can without increasing your pain for __________ seconds.  Relax the muscles slowly and completely in between each  repetition. Repeat __________ times. Complete this exercise __________ times per day. STRENGTH - Hamstring, Isometrics  Lie on your back on a firm surface.  Bend your right / left knee approximately __________ degrees.  Dig your heel into the surface as if you are trying to pull it toward your buttocks. Tighten the muscles in the back of your thighs to "dig" as hard as you can without increasing any pain.  Hold this position for __________ seconds.  Release the tension gradually and allow your muscle to completely relax for __________ seconds in between each exercise. Repeat __________ times. Complete this exercise __________ times per day. STRENGTH - Quadriceps, Straight Leg Raises Quality counts! Watch for signs that the quadriceps muscle is working to insure you are strengthening the correct muscles and not "cheating" by substituting with healthier muscles.  Lay on your back with your right / left leg extended and your opposite knee bent.  Tense the muscles in the front of your right / left thigh. You should see either your knee cap slide up or increased dimpling just  above the knee. Your thigh may even quiver.  Tighten these muscles even more and raise your leg 4 to 6 inches off the floor. Hold for __________ seconds.  Keeping these muscles tense, lower your leg.  Relax the muscles slowly and completely in between each repetition. Repeat __________ times. Complete this exercise __________ times per day. STRENGTH - Hip Extensors, Bridge  Lie on your back on a firm surface. Bend your knees and place your feet flat on the floor.  Tighten your buttocks muscles and lift your bottom off the floor until your trunk is level with your thighs. You should feel the muscles in your buttocks and back of your thighs working. If you do not feel these muscles, slide your feet 1 to 2 inches further away from your buttocks.  Hold this position for __________ seconds.  Slowly lower your hips to  the starting position and allow your buttock muscles relax completely before beginning the next repetition.  If this exercise is too easy, you may cross your arms over your chest. Repeat __________ times. Complete this exercise __________ times per day. STRENGTH - Hip Abductors, Straight Leg Raises Be aware of your form throughout the entire exercise so that you exercise the correct muscles. Sloppy form means that you are not strengthening the correct muscles.  Lie on your side so that your head, shoulders, knee and hip line up. You may bend your lower knee to help maintain your balance. Your right / left leg should be on top.  Roll your hips slightly forward, so that your hips are stacked directly over each other and your right / left knee is facing forward.  Lift your top leg up 4 to 6 inches, leading with your heel. Be sure that your foot does not drift forward or that your knee does not roll toward the ceiling.  Hold this position for __________ seconds. You should feel the muscles in your outer hip lifting (you may not notice this until your leg begins to tire).  Slowly lower your leg to the starting position. Allow the muscles to fully relax before beginning the next repetition. Repeat __________ times. Complete this exercise __________ times per day.   This information is not intended to replace advice given to you by your health care provider. Make sure you discuss any questions you have with your health care provider.   Document Released: 01/31/2005 Document Revised: 07/23/2014 Document Reviewed: 10/14/2008 Elsevier Interactive Patient Education 2016 [employer]    Anterior Cruciate Ligament Reconstruction Anterior cruciate ligament (ACL) reconstruction is a surgical procedure to replace a torn ACL. A ligament is a strong, fibrous, cordlike tissue that connects your bones. Your ACL guides your shin bone (tibia) as it moves in your knee joint. When your ACL is torn, your knee  joint loses stability. During ACL reconstruction, a tissue from a ligament from another part of your body or from a donor (graft) is used to replace your torn ACL. LET [facility] CARE PROVIDER KNOW ABOUT:   Any allergies you have.  Any medicine you are taking, including vitamins, herbs, eye drops, over-the-counter medicines, and creams.  Problems you have had with numbing medicines (local anesthetics) or medicines that make you sleep (general anesthetics).  A family history of problems with anesthetics.  Any blood disorders you have or history of excessive bleeding you have.  Previous surgeries you have had.  Any history of tobacco use.  Any long-term health conditions you have, such as diabetes. RISKS AND COMPLICATIONS  Generally, ACL reconstruction is a safe procedure. The following complications are very rare, but they can occur:  Infection.  Failure or repeated rupture of the reconstructed ligament.  Knee stiffness. BEFORE THE PROCEDURE  Your health care provider will instruct you when you need to stop eating and drinking.  Ask your health care provider if you need to change or stop taking any medicines that you regularly take. PROCEDURE  A small incision will be made in your knee. A thin, flexible, tube-like surgical instrument with a camera on the end (arthroscope) will be inserted into your knee joint. Your torn ACL will be removed with the arthroscope. The graft will be inserted into your knee joint with the arthroscope. Guides will be used to place the graft in the proper position. Tunnels in your upper tibia and femur will be created with large drills, which are placed over guidewires. The graft will be pulled into the tunnels on both your femur and your tibia. The graft will be secured in place in the tunnels with screws.  AFTER THE PROCEDURE You will be taken to the recovery area where a nurse will watch and check your progress. You will be allowed to leave with a family  member or other adult who will stay with you once your health care provider feels it is safe for you to go home.    This information is not intended to replace advice given to you by your health care provider. Make sure you discuss any questions you have with your health care provider.   Document Released: 05/01/2004 Document Revised: 07/23/2014 Document Reviewed: 11/19/2011 Elsevier Interactive Patient Education [employer].

## 2015-07-19 NOTE — Progress Notes (Signed)
Joanna Baker is a 45 y.o. female who presents to [place]: Primary Care today for follow-up left knee pain and MRI. Patient was seen on 07/08/2015 for left knee injury. At that time she was thought to possibly have an anterior cruciate ligament tear. MRI was obtained at [facility] in the interim showing a complete rupture of the anterior cruciate ligament with increased signal at the root of the medial meniscus with possible degenerative tear. She notes her symptoms have improved. She does note however worsening knee swelling. She is able to walk without brace. She has a history of right knee anterior cruciate ligament tear which was not repaired surgically. She would like a trial of rehabilitation before she would consider surgery on her left knee.   Past Medical History  Diagnosis Date  . IBS (irritable bowel syndrome)   . TB (tuberculosis), treated      6 month of INH at age 68  . ACL injury tear     Right knee, not repaired.    . Fatty liver   . Anxiety    Past Surgical History  Procedure Laterality Date  . Tonsillectomy  1993   Social History  Substance Use Topics  . Smoking status: Current Every Day Smoker -- 0.25 packs/day for 5 years    Types: Cigarettes  . Smokeless tobacco: Never Used  . Alcohol Use: 1.2 oz/week    2 Cans of beer per week   family history includes Alcohol abuse in her father; Depression in her paternal grandfather; Heart attack in her maternal grandmother; Pulmonary embolism in her mother; Stroke (age of onset: 54) in her mother.  ROS as above Medications: Current Outpatient Prescriptions  Medication Sig Dispense Refill  . Calcium Carbonate-Vitamin D (CALCIUM-VITAMIN D) 500-200 MG-UNIT per tablet Take 1 tablet by mouth daily.      Marland Kitchen EPINEPHrine 0.3 mg/0.3 mL IJ SOAJ injection Inject 0.3 mLs (0.3 mg total) into the muscle once. 1 Device 1  . Kava, Piper  methysticum, (KAVA KAVA PO) Take 10 drops by mouth as needed.      . loratadine (CLARITIN) 10 MG tablet Take 10 mg by mouth daily.    Marland Kitchen LORazepam (ATIVAN) 0.5 MG tablet Take 1 tablet (0.5 mg total) by mouth every 8 (eight) hours as needed for anxiety. 30 tablet 2  . Multiple Vitamin (MULTIVITAMIN WITH MINERALS) TABS Take 1 tablet by mouth daily.    . naproxen sodium (ANAPROX) 220 MG tablet Take 220 mg by mouth as needed.     . nicotine (NICODERM CQ - DOSED IN MG/24 HOURS) 14 mg/24hr patch Place 1 patch (14 mg total) onto the skin daily. 28 patch 0  . pseudoephedrine (SUDAFED) 30 MG tablet Take 30 mg by mouth every 4 (four) hours as needed.     No current facility-administered medications for this visit.   Allergies  Allergen Reactions  . Influenza Vaccines Anaphylaxis  . Iodine Anaphylaxis  . Chantix [Varenicline Tartrate] Other (See Comments)    Mood changes.   Vassie Loll Allergy] Itching  . Penicillins Hives  . Prednisone Other (See Comments)    Mood changes/extreme to the point of wanting to kill herself.   . Prozac [Fluoxetine Hcl]     Palpitations.  . Suboxone [Buprenorphine Hcl-Naloxone Hcl]   . Sulfa Antibiotics Hives     Exam:  BP 112/61 mmHg  Pulse 86  Wt 186 lb (84.369 kg) Gen: Well NAD Left knee moderate effusion. Nontender.  Normal motion. Pulses capillary refill and sensation.  MRI left knee from [facility] reviewed showing complete rupture of the anterior cruciate ligament.  No results found for this or any previous visit (from the past 24 hour(s)). No results found.   Please see individual assessment and plan sections.

## 2015-07-20 NOTE — Assessment & Plan Note (Signed)
Left knee. Discussed options. Trial of formal physical therapy referral and home exercises. Recheck in a few weeks.

## 2015-07-25 ENCOUNTER — Telehealth: Payer: Self-pay

## 2015-07-25 NOTE — Telephone Encounter (Signed)
Pt called stating that after showering this am she noticed a bruise forming on the popliteal fossa of her left leg. She states that it is a dime size bruise, somewhat purple in color, not hard or hot to the touch. She denies any pain. Advised pt that I would let you know and call her back. Please advise.

## 2015-07-25 NOTE — Telephone Encounter (Signed)
Pt notified of results

## 2015-07-25 NOTE — Telephone Encounter (Signed)
Yes this is normal after an ACL tear.

## 2015-08-09 ENCOUNTER — Ambulatory Visit (INDEPENDENT_AMBULATORY_CARE_PROVIDER_SITE_OTHER): Payer: BLUE CROSS/BLUE SHIELD | Admitting: Family Medicine

## 2015-08-09 ENCOUNTER — Encounter: Payer: Self-pay | Admitting: Family Medicine

## 2015-08-09 VITALS — BP 112/55 | HR 70 | Wt 188.0 lb

## 2015-08-09 DIAGNOSIS — S83512A Sprain of anterior cruciate ligament of left knee, initial encounter: Secondary | ICD-10-CM | POA: Diagnosis not present

## 2015-08-09 NOTE — Patient Instructions (Signed)
Thank you for coming in today. Return in 4-6 weeks.  Have PT fax reports to me. [phone]

## 2015-08-10 NOTE — Progress Notes (Signed)
Joanna Baker is a 45 y.o. female who presents to [place]: Primary Care today for f/u left knee ACL tear. Patient is now about 5 weeks status post injury of left knee results in an anterior cruciate ligament tear. She has a history of anterior cruciate ligament tear to the right knee that she treated conservatively without surgery. In the interim she's been doing rest watchful waiting and physical therapy. She has received physical therapy at an outside therapy location. She feels quite improved. She notes pain is improved and she is able to walk normally without bracing. She denies any subluxation or instability of the knee. She still continues to experience some mild anterior knee pain. She feels quite well.   Past Medical History  Diagnosis Date  . IBS (irritable bowel syndrome)   . TB (tuberculosis), treated      6 month of INH at age 81  . ACL injury tear     Right knee, not repaired.    . Fatty liver   . Anxiety    Past Surgical History  Procedure Laterality Date  . Tonsillectomy  1993   Social History  Substance Use Topics  . Smoking status: Current Every Day Smoker -- 0.25 packs/day for 5 years    Types: Cigarettes  . Smokeless tobacco: Never Used  . Alcohol Use: 1.2 oz/week    2 Cans of beer per week   family history includes Alcohol abuse in her father; Depression in her paternal grandfather; Heart attack in her maternal grandmother; Pulmonary embolism in her mother; Stroke (age of onset: 72) in her mother.  ROS as above Medications: Current Outpatient Prescriptions  Medication Sig Dispense Refill  . Calcium Carbonate-Vitamin D (CALCIUM-VITAMIN D) 500-200 MG-UNIT per tablet Take 1 tablet by mouth daily.      Marland Kitchen EPINEPHrine 0.3 mg/0.3 mL IJ SOAJ injection Inject 0.3 mLs (0.3 mg total) into the muscle once. 1 Device 1  . Kava, Piper methysticum, (KAVA KAVA PO) Take 10 drops by  mouth as needed.      . loratadine (CLARITIN) 10 MG tablet Take 10 mg by mouth daily.    Marland Kitchen LORazepam (ATIVAN) 0.5 MG tablet Take 1 tablet (0.5 mg total) by mouth every 8 (eight) hours as needed for anxiety. 30 tablet 2  . Multiple Vitamin (MULTIVITAMIN WITH MINERALS) TABS Take 1 tablet by mouth daily.    . naproxen sodium (ANAPROX) 220 MG tablet Take 220 mg by mouth as needed.     . nicotine (NICODERM CQ - DOSED IN MG/24 HOURS) 14 mg/24hr patch Place 1 patch (14 mg total) onto the skin daily. 28 patch 0  . pseudoephedrine (SUDAFED) 30 MG tablet Take 30 mg by mouth every 4 (four) hours as needed.     No current facility-administered medications for this visit.   Allergies  Allergen Reactions  . Influenza Vaccines Anaphylaxis  . Iodine Anaphylaxis  . Chantix [Varenicline Tartrate] Other (See Comments)    Mood changes.   Vassie Loll Allergy] Itching  . Penicillins Hives  . Prednisone Other (See Comments)    Mood changes/extreme to the point of wanting to kill herself.   . Prozac [Fluoxetine Hcl]     Palpitations.  . Suboxone [Buprenorphine Hcl-Naloxone Hcl]   . Sulfa Antibiotics Hives     Exam:  BP 112/55 mmHg  Pulse 70  Wt 188 lb (85.276 kg) Gen: Well NAD Left knee is well-appearing with no effusion. Normal motion. Nontender.  No results found for this or any previous visit (from the past 24 hour(s)). No results found.   Please see individual assessment and plan sections.

## 2015-08-10 NOTE — Assessment & Plan Note (Signed)
Improving. Continue physical therapy. Recheck in about one month. Encourage patient to get physical therapy notes sent to my office.

## 2015-09-02 ENCOUNTER — Encounter: Payer: Self-pay | Admitting: Physician Assistant

## 2015-09-02 ENCOUNTER — Ambulatory Visit (INDEPENDENT_AMBULATORY_CARE_PROVIDER_SITE_OTHER): Payer: BLUE CROSS/BLUE SHIELD

## 2015-09-02 ENCOUNTER — Ambulatory Visit (INDEPENDENT_AMBULATORY_CARE_PROVIDER_SITE_OTHER): Payer: BLUE CROSS/BLUE SHIELD | Admitting: Physician Assistant

## 2015-09-02 VITALS — BP 92/52 | HR 86 | Ht 67.0 in | Wt 187.0 lb

## 2015-09-02 DIAGNOSIS — R1011 Right upper quadrant pain: Secondary | ICD-10-CM | POA: Insufficient documentation

## 2015-09-02 DIAGNOSIS — R110 Nausea: Secondary | ICD-10-CM

## 2015-09-02 DIAGNOSIS — R52 Pain, unspecified: Secondary | ICD-10-CM

## 2015-09-02 DIAGNOSIS — R319 Hematuria, unspecified: Secondary | ICD-10-CM | POA: Diagnosis not present

## 2015-09-02 DIAGNOSIS — R1030 Lower abdominal pain, unspecified: Secondary | ICD-10-CM | POA: Diagnosis not present

## 2015-09-02 DIAGNOSIS — R220 Localized swelling, mass and lump, head: Secondary | ICD-10-CM | POA: Insufficient documentation

## 2015-09-02 LAB — CBC WITH DIFFERENTIAL/PLATELET
BASOS ABS: 0 10*3/uL (ref 0.0–0.1)
BASOS PCT: 1 % (ref 0–1)
Eosinophils Absolute: 0 10*3/uL (ref 0.0–0.7)
Eosinophils Relative: 1 % (ref 0–5)
HEMATOCRIT: 40 % (ref 36.0–46.0)
HEMOGLOBIN: 13.7 g/dL (ref 12.0–15.0)
LYMPHS PCT: 27 % (ref 12–46)
Lymphs Abs: 1.2 10*3/uL (ref 0.7–4.0)
MCH: 30 pg (ref 26.0–34.0)
MCHC: 34.3 g/dL (ref 30.0–36.0)
MCV: 87.5 fL (ref 78.0–100.0)
MPV: 10.3 fL (ref 8.6–12.4)
Monocytes Absolute: 0.6 10*3/uL (ref 0.1–1.0)
Monocytes Relative: 12 % (ref 3–12)
NEUTROS ABS: 2.7 10*3/uL (ref 1.7–7.7)
NEUTROS PCT: 59 % (ref 43–77)
Platelets: 217 10*3/uL (ref 150–400)
RBC: 4.57 MIL/uL (ref 3.87–5.11)
RDW: 13.4 % (ref 11.5–15.5)
WBC: 4.6 10*3/uL (ref 4.0–10.5)

## 2015-09-02 LAB — POCT URINALYSIS DIPSTICK
Bilirubin, UA: NEGATIVE
GLUCOSE UA: NEGATIVE
Ketones, UA: NEGATIVE
LEUKOCYTES UA: NEGATIVE
NITRITE UA: NEGATIVE
PROTEIN UA: NEGATIVE
SPEC GRAV UA: 1.02
UROBILINOGEN UA: 0.2
pH, UA: 7

## 2015-09-02 LAB — COMPLETE METABOLIC PANEL WITH GFR
ALBUMIN: 4.1 g/dL (ref 3.6–5.1)
ALK PHOS: 37 U/L (ref 33–115)
ALT: 15 U/L (ref 6–29)
AST: 18 U/L (ref 10–30)
BILIRUBIN TOTAL: 0.5 mg/dL (ref 0.2–1.2)
BUN: 8 mg/dL (ref 7–25)
CO2: 30 mmol/L (ref 20–31)
Calcium: 9 mg/dL (ref 8.6–10.2)
Chloride: 105 mmol/L (ref 98–110)
Creat: 0.62 mg/dL (ref 0.50–1.10)
GFR, Est African American: >89 mL/min (ref >=60)
GLUCOSE: 89 mg/dL (ref 65–99)
Potassium: 4.2 mmol/L (ref 3.5–5.3)
SODIUM: 139 mmol/L (ref 135–146)
TOTAL PROTEIN: 6.4 g/dL (ref 6.1–8.1)

## 2015-09-02 LAB — LIPASE: LIPASE: 19 U/L (ref 7–60)

## 2015-09-02 NOTE — Addendum Note (Signed)
Addended by: Donella Stade on: 09/02/2015 12:17 PM   Modules accepted: Level of Service

## 2015-09-02 NOTE — Addendum Note (Signed)
Addended by: Donella Stade on: 09/02/2015 11:23 AM   Modules accepted: Orders

## 2015-09-02 NOTE — Progress Notes (Addendum)
Subjective:    Patient ID: Joanna Baker, female    DOB: [birth year], 45 y.o.   MRN: [id number]  Abdominal Pain  Patient is a 45 year old female that presents with sharp, 6/10 RUQ pain for the past 2.5 weeks with symptoms being worse in the evening and after consuming fatty foods. Patient states that she has experienced mild intermittent RUQ pain for approximately eight years. Patient's pain has not kept her from working. Patient states that she has also experienced some suprapubic "discomfort", back "discomfort", and nausea. Patient denies dysuria, constipation and diarrhea. Patient has tried tea with Cleaver's root, which has provided her with limited relief. Additional concerns for this visit include addressing right lower jaw pain with "bump" that patient has experienced intermittently for the past 19 years. Patient has been afebrile  Review of Systems   Please see HPI    Objective:   Physical Exam  Constitutional: She is oriented to person, place, and time. She appears well-developed and well-nourished. No distress.  HENT:  Head: Normocephalic and atraumatic.  Nose: Nose normal.  Mouth/Throat: Oropharynx is clear and moist.  Patient's left TM is sclerosed, consistent with prior middle ear infections.   Eyes: Conjunctivae and EOM are normal. Pupils are equal, round, and reactive to light. Right eye exhibits no discharge. Left eye exhibits no discharge.  Neck: Normal range of motion. No tracheal deviation present. No thyromegaly present.  Patient has right lower mandibular pain and a palpable 1x1 cm mass along the inferior line of the jaw 4 cm from the right ear.  Cardiovascular: Normal rate, regular rhythm, normal heart sounds and intact distal pulses.  Exam reveals no friction rub.   No murmur heard. Pulmonary/Chest: Effort normal and breath sounds normal. No respiratory distress. She has no wheezes.  Abdominal: Soft. Bowel sounds are normal.  Patient has significant pain in the RUQ  with positive murphy's sign. Patient has right lower quadrant pain with radiation to the umbilicus. Patient has rebound tenderness or guarding. No palpable mass. Patient has positive bowl sounds in all four quadrants. Patient states that she has suprapubic discomfort but no pain on palpation.   Musculoskeletal: Normal range of motion.  Patient has right sided CVA tenderness.   Neurological: She is alert and oriented to person, place, and time.  Skin: She is not diaphoretic.  Patient's skin does not appear jaundiced.  Psychiatric: She has a normal mood and affect. Her behavior is normal. Judgment and thought content normal.      Assessment & Plan:   1.RUQ pain/nausea/pain after eating  Patient has intermittent periods of right upper quadrant pain exacerbated when eating fatty foods particularly dairy. On physical exam, patient has RUQ pain, RLQ pain radiating to the umbilicus, right CVA tenderness, and suprapubic discomfort. Differential diagnosis includes acute cholecystitis, pancreatitis, appendicitis and UTI. Patient will undergo CBC to assess for leukocytosis, with strong suspicion for cholecystitis. Patient has some back discomfort and nausea. However, index of suspicion is low for pancreatitis. Patient denies dysuria or increased frequency. An in office urinalysis was negative for markers of infection, making UTI less likely.was positive for blood but she is on her period. Patient's urine will be sent for culture.  Patient was ordered a CBC, CMP (need bilirubin and ALT/AST levels), amylase and lipase. Patient will undergo RUQ ultrasonography pending CBC results. If CBC elevated will get CT of abdomen/pelvis to rule out appendicitis.  2. Right lower Mandibular Pain with mass Patient has right lower mandibular pain and a palpable  1x1 cm mass along the inferior line of the jaw 4 cm from the right ear. Patient states mass has been intermittently palpable over the past 19 years. Patient states that  mass sometimes becomes apparent when she has fever blisters. Differential diagnosis includes focal lymphadenopathy and a salivary duct stone. Patient was advised to try sour candies and warm compresses to see if tenderness improves. Patient was advised to follow up if symptoms persist.

## 2015-09-04 LAB — URINE CULTURE
Colony Count: NO GROWTH
Organism ID, Bacteria: NO GROWTH

## 2015-09-05 NOTE — Addendum Note (Signed)
Addended by: Donella Stade on: 09/05/2015 04:23 PM   Modules accepted: Orders

## 2015-09-19 ENCOUNTER — Ambulatory Visit (HOSPITAL_COMMUNITY): Payer: BLUE CROSS/BLUE SHIELD

## 2015-09-20 ENCOUNTER — Ambulatory Visit: Payer: BLUE CROSS/BLUE SHIELD | Admitting: Family Medicine

## 2015-09-30 ENCOUNTER — Ambulatory Visit (INDEPENDENT_AMBULATORY_CARE_PROVIDER_SITE_OTHER): Payer: BLUE CROSS/BLUE SHIELD | Admitting: Physician Assistant

## 2015-09-30 ENCOUNTER — Encounter: Payer: Self-pay | Admitting: Physician Assistant

## 2015-09-30 VITALS — BP 104/50 | HR 73 | Temp 98.3°F | Ht 67.0 in | Wt 182.0 lb

## 2015-09-30 DIAGNOSIS — R059 Cough, unspecified: Secondary | ICD-10-CM

## 2015-09-30 DIAGNOSIS — J069 Acute upper respiratory infection, unspecified: Secondary | ICD-10-CM

## 2015-09-30 DIAGNOSIS — R05 Cough: Secondary | ICD-10-CM | POA: Diagnosis not present

## 2015-09-30 MED ORDER — AZITHROMYCIN 250 MG PO TABS: ORAL_TABLET | ORAL | Status: DC

## 2015-09-30 MED ORDER — METHYLPREDNISOLONE SODIUM SUCC 125 MG IJ SOLR
125.0000 mg | Freq: Once | INTRAMUSCULAR | Status: AC
  Administered 2015-09-30: 125 mg via INTRAMUSCULAR

## 2015-09-30 NOTE — Patient Instructions (Signed)
Upper Respiratory Infection, Adult Most upper respiratory infections (URIs) are a viral infection of the air passages leading to the lungs. A URI affects the nose, throat, and upper air passages. The most common type of URI is nasopharyngitis and is typically referred to as "the common cold." URIs run their course and usually go away on their own. Most of the time, a URI does not require medical attention, but sometimes a bacterial infection in the upper airways can follow a viral infection. This is called a secondary infection. Sinus and middle ear infections are common types of secondary upper respiratory infections. Bacterial pneumonia can also complicate a URI. A URI can worsen asthma and chronic obstructive pulmonary disease (COPD). Sometimes, these complications can require emergency medical care and may be life threatening.  CAUSES Almost all URIs are caused by viruses. A virus is a type of germ and can spread from one person to another.  RISKS FACTORS You may be at risk for a URI if:   You smoke.   You have chronic heart or lung disease.  You have a weakened defense (immune) system.   You are very young or very old.   You have nasal allergies or asthma.  You work in crowded or poorly ventilated areas.  You work in health care facilities or schools. SIGNS AND SYMPTOMS  Symptoms typically develop 2-3 days after you come in contact with a cold virus. Most viral URIs last 7-10 days. However, viral URIs from the influenza virus (flu virus) can last 14-18 days and are typically more severe. Symptoms may include:   Runny or stuffy (congested) nose.   Sneezing.   Cough.   Sore throat.   Headache.   Fatigue.   Fever.   Loss of appetite.   Pain in your forehead, behind your eyes, and over your cheekbones (sinus pain).  Muscle aches.  DIAGNOSIS  Your health care provider may diagnose a URI by:  Physical exam.  Tests to check that your symptoms are not due to  another condition such as:  Strep throat.  Sinusitis.  Pneumonia.  Asthma. TREATMENT  A URI goes away on its own with time. It cannot be cured with medicines, but medicines may be prescribed or recommended to relieve symptoms. Medicines may help:  Reduce your fever.  Reduce your cough.  Relieve nasal congestion. HOME CARE INSTRUCTIONS   Take medicines only as directed by your health care provider.   Gargle warm saltwater or take cough drops to comfort your throat as directed by your health care provider.  Use a warm mist humidifier or inhale steam from a shower to increase air moisture. This may make it easier to breathe.  Drink enough fluid to keep your urine clear or pale yellow.   Eat soups and other clear broths and maintain good nutrition.   Rest as needed.   Return to work when your temperature has returned to normal or as your health care provider advises. You may need to stay home longer to avoid infecting others. You can also use a face mask and careful hand washing to prevent spread of the virus.  Increase the usage of your inhaler if you have asthma.   Do not use any tobacco products, including cigarettes, chewing tobacco, or electronic cigarettes. If you need help quitting, ask your health care provider. PREVENTION  The best way to protect yourself from getting a cold is to practice good hygiene.   Avoid oral or hand contact with people with cold   symptoms.   Wash your hands often if contact occurs.  There is no clear evidence that vitamin C, vitamin E, echinacea, or exercise reduces the chance of developing a cold. However, it is always recommended to get plenty of rest, exercise, and practice good nutrition.  SEEK MEDICAL CARE IF:   You are getting worse rather than better.   Your symptoms are not controlled by medicine.   You have chills.  You have worsening shortness of breath.  You have brown or red mucus.  You have yellow or brown nasal  discharge.  You have pain in your face, especially when you bend forward.  You have a fever.  You have swollen neck glands.  You have pain while swallowing.  You have white areas in the back of your throat. SEEK IMMEDIATE MEDICAL CARE IF:   You have severe or persistent:  Headache.  Ear pain.  Sinus pain.  Chest pain.  You have chronic lung disease and any of the following:  Wheezing.  Prolonged cough.  Coughing up blood.  A change in your usual mucus.  You have a stiff neck.  You have changes in your:  Vision.  Hearing.  Thinking.  Mood. MAKE SURE YOU:   Understand these instructions.  Will watch your condition.  Will get help right away if you are not doing well or get worse.   This information is not intended to replace advice given to you by your health care provider. Make sure you discuss any questions you have with your health care provider.   Document Released: 12/26/2000 Document Revised: 11/16/2014 Document Reviewed: 10/07/2013 Elsevier Interactive Patient Education 2016 [employer] Inc[employer]  

## 2015-09-30 NOTE — Progress Notes (Signed)
   Subjective:    Patient ID: Joanna Baker, female    DOB: 1970-12-22, 45 y.o.   MRN: [id number]  HPI Patient is a 45 year old female who presents to the clinic with one week of cough, headache, nasal congestion, sore throat, ear congestion. Her chest does still try but no shortness of breath or wheezing. She has tried Sudafed and Aleve. She denies any fever. She denies any nausea or vomiting. She does have numerous allergies which tend to trigger sinus infections. She was exposed last week to a couch made of feathers which she is allergic too. Takes daily claritin.    Review of Systems  All other systems reviewed and are negative.      Objective:   Physical Exam  Constitutional: She is oriented to person, place, and time. She appears well-developed and well-nourished.  HENT:  Head: Normocephalic and atraumatic.  TMs clear bilaterally. There did appear to be some minimal dullness behind right TM.  Oropharynx erythematous with some right tonsillar enlargement with no exudate.  There was bilateral frontal and maxillary tenderness.  Right nasal turbinates red and swollen. Left nasal turbinate appear patent.  Eyes: Conjunctivae are normal. Right eye exhibits no discharge. Left eye exhibits no discharge.  Neck: Normal range of motion. Neck supple.  Bilateral tender anterior cervical adenopathy.  Cardiovascular: Normal rate, regular rhythm and normal heart sounds.   Pulmonary/Chest: Effort normal and breath sounds normal. She has no wheezes.  Lymphadenopathy:    She has cervical adenopathy.  Neurological: She is alert and oriented to person, place, and time.  Psychiatric: She has a normal mood and affect. Her behavior is normal.          Assessment & Plan:  Acute upper respiratory infections/cough-patient was treated with Z-Pak as well as Solu-Medrol 125 IM injection in office today. I do think some of her symptoms could have been triggered by an allergic reaction. She has a  history of being exposed to others in the last week which she is allergic to. Discussed Delsym and honey for cough. Follow-up as needed.

## 2015-10-19 ENCOUNTER — Other Ambulatory Visit: Payer: Self-pay | Admitting: *Deleted

## 2015-10-19 ENCOUNTER — Other Ambulatory Visit: Payer: Self-pay | Admitting: Physician Assistant

## 2015-10-19 MED ORDER — LORAZEPAM 0.5 MG PO TABS: 0.5000 mg | ORAL_TABLET | Freq: Three times a day (TID) | ORAL | Status: DC | PRN

## 2015-11-10 ENCOUNTER — Telehealth: Payer: BLUE CROSS/BLUE SHIELD | Admitting: Physician Assistant

## 2015-11-10 DIAGNOSIS — R21 Rash and other nonspecific skin eruption: Secondary | ICD-10-CM

## 2015-11-10 NOTE — Progress Notes (Signed)
E Visit for Rash  We are sorry that you are not feeling well. Here is how we plan to help. Based on what you have shared with me you potentially have an allergic rash or irritant dermatitis. This thankfully seems mild. Keep area clean and dry. Avoid applying any deodorant or creams to the area.  Take an OTC Benadryl as directed. Steroid creams like Kenalog can sometimes help but I see you have an allergy to prednisone. Have you ever had a steroid cream before and tolerated it well?  HOME CARE:   Take cool showers and avoid direct sunlight.  Apply cool compress or wet dressings.  Use hydrocortisone cream.  Take an antihistamine like Benadryl for widespread rashes that itch.  The adult dose of Benadryl is 25-50 mg by mouth 4 times daily.  Caution:  This type of medication may cause sleepiness.  Do not drink alcohol, drive, or operate dangerous machinery while taking antihistamines.  Do not take these medications if you have prostate enlargement.  Read package instructions thoroughly on all medications that you take.  GET HELP RIGHT AWAY IF:   Symptoms don't go away after treatment.  Severe itching that persists.  If you rash spreads or swells.  If you rash begins to smell.  If it blisters and opens or develops a yellow-brown crust.  You develop a fever.  You have a sore throat.  You become short of breath.  MAKE SURE YOU:  Understand these instructions. Will watch your condition. Will get help right away if you are not doing well or get worse.  Thank you for choosing an e-visit. Your e-visit answers were reviewed by a board certified advanced clinical practitioner to complete your personal care plan. Depending upon the condition, your plan could have included both over the counter or prescription medications. Please review your pharmacy choice. Be sure that the pharmacy you have chosen is open so that you can pick up your prescription now.  If there is a problem you may  message your provider in [place] to have the prescription routed to another pharmacy. Your safety is important to [place]. If you have drug allergies check your prescription carefully.  For the next 24 hours, you can use MyChart to ask questions about today's visit, request a non-urgent call back, or ask for a work or school excuse from your e-visit provider. You will get an email in the next two days asking about your experience. I hope that your e-visit has been valuable and will speed your recovery.

## 2015-12-26 ENCOUNTER — Ambulatory Visit (INDEPENDENT_AMBULATORY_CARE_PROVIDER_SITE_OTHER): Payer: BLUE CROSS/BLUE SHIELD | Admitting: Family Medicine

## 2015-12-26 ENCOUNTER — Encounter: Payer: Self-pay | Admitting: Physician Assistant

## 2015-12-26 VITALS — BP 96/55 | HR 90 | Ht 67.0 in | Wt 184.0 lb

## 2015-12-26 DIAGNOSIS — T148 Other injury of unspecified body region: Secondary | ICD-10-CM | POA: Diagnosis not present

## 2015-12-26 DIAGNOSIS — T148XXA Other injury of unspecified body region, initial encounter: Secondary | ICD-10-CM

## 2015-12-26 DIAGNOSIS — S61219A Laceration without foreign body of unspecified finger without damage to nail, initial encounter: Secondary | ICD-10-CM

## 2015-12-26 DIAGNOSIS — S61209A Unspecified open wound of unspecified finger without damage to nail, initial encounter: Secondary | ICD-10-CM | POA: Diagnosis not present

## 2015-12-26 NOTE — Progress Notes (Signed)
Subjective:    CC: cut finger HPI: She came in today because yesterday while cutting her flowers around 2:30 in the afternoon she actually cut the tip of her right first finger off. She says there was no flap of skin attached. She actually went to the emergency department at [facility] and says by 2 AM she was actually never seen by a provider. He did finally quit bleeding on its own at that point so she went home. She's here today for further care. Her last tetanus vaccine was 3 years ago.  Past medical history, Surgical history, Family history not pertinant except as noted below, Social history, Allergies, and medications have been entered into the medical record, reviewed, and corrections made.   Review of Systems: No fevers, chills, night sweats, weight loss, chest pain, or shortness of breath.   Objective:    General: Well Developed, well nourished, and in no acute distress.  Neuro: Alert and oriented x3, extra-ocular muscles intact, sensation grossly intact.  HEENT: Normocephalic, atraumatic  Skin: Warm and dry, no rashes.On the first finger on her left hand she has a huge tip of the finger that is missing an actively bleeding. We had her soak the finger in saline to try to remove the gauze very gently but started bleeding very briskly. Cardiac: Regular rate Respiratory: . Not using accessory muscles, speaking in full sentences.   Impression and Recommendations:   Skin avulsion-known to the area with lidocaine and then used cautery to stop the bleeding. Wrapped with nonadherent gauze and use a splint to protect the fingertip. Keep elevated above heart level. Do not remove bandage for at least 24 hours. Follow-up if any signs of poor wound healing or infection or active bleeding again. Tetanus is up-to-date. Work note given for today.

## 2015-12-26 NOTE — Progress Notes (Deleted)
   Subjective:    Patient ID: Joanna Baker, female    DOB: [birth year], 45 y.o.   MRN: [id number]  HPI    Review of Systems     Objective:   Physical Exam        Assessment & Plan:

## 2016-01-09 ENCOUNTER — Telehealth: Payer: Self-pay | Admitting: *Deleted

## 2016-01-09 DIAGNOSIS — K649 Unspecified hemorrhoids: Secondary | ICD-10-CM

## 2016-01-09 DIAGNOSIS — K589 Irritable bowel syndrome without diarrhea: Secondary | ICD-10-CM

## 2016-01-09 NOTE — Telephone Encounter (Signed)
Referral placed.

## 2016-01-16 DIAGNOSIS — Z01419 Encounter for gynecological examination (general) (routine) without abnormal findings: Secondary | ICD-10-CM | POA: Diagnosis not present

## 2016-01-16 DIAGNOSIS — Z6828 Body mass index (BMI) 28.0-28.9, adult: Secondary | ICD-10-CM | POA: Diagnosis not present

## 2016-01-16 DIAGNOSIS — Z01411 Encounter for gynecological examination (general) (routine) with abnormal findings: Secondary | ICD-10-CM | POA: Diagnosis not present

## 2016-01-16 DIAGNOSIS — N760 Acute vaginitis: Secondary | ICD-10-CM | POA: Diagnosis not present

## 2016-01-19 LAB — HM PAP SMEAR: HM PAP: NEGATIVE

## 2016-01-24 ENCOUNTER — Telehealth: Payer: Self-pay | Admitting: *Deleted

## 2016-01-24 DIAGNOSIS — N390 Urinary tract infection, site not specified: Secondary | ICD-10-CM | POA: Diagnosis not present

## 2016-01-24 DIAGNOSIS — R319 Hematuria, unspecified: Secondary | ICD-10-CM | POA: Diagnosis not present

## 2016-01-24 NOTE — Telephone Encounter (Signed)
Patient called yesterday and states that her GYN wrote a prescription for metronidazole and it was doing "something to my urine" and she stopped the medication. Wants to know what to do. Called and left message that she should contact the prescriber and let them know. And if she does not want to go back to them then she will need an appt to be eval. (Called and I  left this on vm  this am)

## 2016-02-03 DIAGNOSIS — R1031 Right lower quadrant pain: Secondary | ICD-10-CM | POA: Diagnosis not present

## 2016-02-03 DIAGNOSIS — K625 Hemorrhage of anus and rectum: Secondary | ICD-10-CM | POA: Diagnosis not present

## 2016-02-03 DIAGNOSIS — K589 Irritable bowel syndrome without diarrhea: Secondary | ICD-10-CM | POA: Diagnosis not present

## 2016-02-03 DIAGNOSIS — R1011 Right upper quadrant pain: Secondary | ICD-10-CM | POA: Diagnosis not present

## 2016-02-07 DIAGNOSIS — R1012 Left upper quadrant pain: Secondary | ICD-10-CM | POA: Diagnosis not present

## 2016-02-07 DIAGNOSIS — R1011 Right upper quadrant pain: Secondary | ICD-10-CM | POA: Diagnosis not present

## 2016-03-22 DIAGNOSIS — K625 Hemorrhage of anus and rectum: Secondary | ICD-10-CM | POA: Diagnosis not present

## 2016-04-20 ENCOUNTER — Ambulatory Visit: Payer: BLUE CROSS/BLUE SHIELD | Admitting: Physician Assistant

## 2016-05-08 ENCOUNTER — Ambulatory Visit: Payer: BLUE CROSS/BLUE SHIELD | Admitting: Physician Assistant

## 2016-05-18 ENCOUNTER — Encounter: Payer: Self-pay | Admitting: Physician Assistant

## 2016-05-18 ENCOUNTER — Ambulatory Visit (INDEPENDENT_AMBULATORY_CARE_PROVIDER_SITE_OTHER): Payer: BLUE CROSS/BLUE SHIELD | Admitting: Physician Assistant

## 2016-05-18 VITALS — BP 105/58 | HR 86 | Ht 67.0 in | Wt 178.0 lb

## 2016-05-18 DIAGNOSIS — T782XXS Anaphylactic shock, unspecified, sequela: Secondary | ICD-10-CM

## 2016-05-18 DIAGNOSIS — Z13220 Encounter for screening for lipoid disorders: Secondary | ICD-10-CM

## 2016-05-18 DIAGNOSIS — R05 Cough: Secondary | ICD-10-CM

## 2016-05-18 DIAGNOSIS — K760 Fatty (change of) liver, not elsewhere classified: Secondary | ICD-10-CM

## 2016-05-18 DIAGNOSIS — F411 Generalized anxiety disorder: Secondary | ICD-10-CM

## 2016-05-18 DIAGNOSIS — M5410 Radiculopathy, site unspecified: Secondary | ICD-10-CM | POA: Insufficient documentation

## 2016-05-18 DIAGNOSIS — N938 Other specified abnormal uterine and vaginal bleeding: Secondary | ICD-10-CM

## 2016-05-18 DIAGNOSIS — R059 Cough, unspecified: Secondary | ICD-10-CM

## 2016-05-18 DIAGNOSIS — R079 Chest pain, unspecified: Secondary | ICD-10-CM | POA: Insufficient documentation

## 2016-05-18 DIAGNOSIS — Z131 Encounter for screening for diabetes mellitus: Secondary | ICD-10-CM

## 2016-05-18 DIAGNOSIS — H93A2 Pulsatile tinnitus, left ear: Secondary | ICD-10-CM | POA: Insufficient documentation

## 2016-05-18 MED ORDER — EPINEPHRINE 0.3 MG/0.3ML IJ SOAJ: 0.3000 mg | Freq: Once | INTRAMUSCULAR | 1 refills | Status: AC

## 2016-05-18 MED ORDER — ALBUTEROL SULFATE HFA 108 (90 BASE) MCG/ACT IN AERS: 2.0000 | INHALATION_SPRAY | Freq: Four times a day (QID) | RESPIRATORY_TRACT | 2 refills | Status: DC | PRN

## 2016-05-18 MED ORDER — MELOXICAM 15 MG PO TABS: 15.0000 mg | ORAL_TABLET | Freq: Every day | ORAL | 1 refills | Status: DC

## 2016-05-18 MED ORDER — METHYLPREDNISOLONE ACETATE 80 MG/ML IJ SUSP
80.0000 mg | Freq: Once | INTRAMUSCULAR | Status: AC
  Administered 2016-05-18: 80 mg via INTRAMUSCULAR

## 2016-05-18 NOTE — Patient Instructions (Signed)
If heartbeat in ear worsens or does not resolve could consider looking into CT to make sure no abnormality behind ear.

## 2016-05-18 NOTE — Progress Notes (Signed)
Subjective:    Patient ID: Arnell Sieving, female    DOB: [birth year], 45 y.o.   MRN: [id number]  HPI  Pt is a 45 yo female who presents to the clinic for follow up and discussion of multiple concerns.   Pt has quit smoking for 4 months. She does take nicotine tablets as needed for cravings. She feels better and like her exercise tolerance is better.   [facility] manages her RUQ pain and nausea associated with fried foods and pumpkin seeds. It has improved overall with food avoidance.   Pt needs refill epi pen for emergency use.   Pt is concerned about irregular bleeding that is heavier than usual. She does have some abdominal pain.   She is having some low back pain with radiates down into right leg that feels more cramp like.   She can hear her heart beat in her left ear. She denies any pain or congestion.   She is having some right sided chest pain. No cough or SOB. No radiation down arm. Not assoicated with exertion. Only happened once.    Review of Systems See HPI.     Objective:   Physical Exam  Constitutional: She is oriented to person, place, and time. She appears well-developed and well-nourished.  HENT:  Head: Normocephalic and atraumatic.  Right Ear: External ear normal.  Left Ear: External ear normal.  Mouth/Throat: Oropharynx is clear and moist. No oropharyngeal exudate.  No conductive hearing loss.   Eyes: Conjunctivae are normal.  Neck: Normal range of motion. Neck supple. No thyromegaly present.  Cardiovascular: Normal rate, regular rhythm and normal heart sounds.   Pulmonary/Chest: Effort normal and breath sounds normal.  Musculoskeletal:  Normal ROM at waist.  No tenderness over lumbar spine to palpation.  Negative straight leg test.   Neurological: She is alert and oriented to person, place, and time.  Psychiatric: She has a normal mood and affect. Her behavior is normal.          Assessment & Plan:  Marland KitchenMarland KitchenDiagnoses and all orders for this  visit:  DUB (dysfunctional uterine bleeding)  Pulsatile tinnitus of left ear  Radicular syndrome of right leg -     TSH -     COMPLETE METABOLIC PANEL WITH GFR -     B12 -     VITAMIN D 25 Hydroxy (Vit-D Deficiency, Fractures) -     meloxicam (MOBIC) 15 MG tablet; Take 1 tablet (15 mg total) by mouth daily. -     methylPREDNISolone acetate (DEPO-MEDROL) injection 80 mg; Inject 1 mL (80 mg total) into the muscle once.  Screening for lipid disorders -     Lipid panel  Screening for diabetes mellitus -     COMPLETE METABOLIC PANEL WITH GFR  Fatty liver disease, nonalcoholic -     Lipid panel  Right-sided chest pain  Anaphylaxis, sequela -     EPINEPHrine 0.3 mg/0.3 mL IJ SOAJ injection; Inject 0.3 mLs (0.3 mg total) into the muscle once.  Cough -     albuterol (PROVENTIL HFA;VENTOLIN HFA) 108 (90 Base) MCG/ACT inhaler; Inhale 2 puffs into the lungs every 6 (six) hours as needed for wheezing or shortness of breath.  Anxiety state -     LORazepam (ATIVAN) 0.5 MG tablet; Take 1 tablet (0.5 mg total) by mouth every 8 (eight) hours as needed for anxiety.   DUB- discussed make appt with GYN. Pt not interested in hormone therapy. Needs to consider ablation.   pulsatile tinnitus  reassured patient didn't find anything on exam. Pt declined carotid doppers to check for stenosis. Will check lipid. HTN controlled.   Right sided chest pain- follow up if happens again. Only happened once.   Radicular symptoms- NSAID, exercises, depo medrol IM given in office today. Follow up as needed.

## 2016-05-21 DIAGNOSIS — M5410 Radiculopathy, site unspecified: Secondary | ICD-10-CM | POA: Diagnosis not present

## 2016-05-21 DIAGNOSIS — N938 Other specified abnormal uterine and vaginal bleeding: Secondary | ICD-10-CM | POA: Insufficient documentation

## 2016-05-21 DIAGNOSIS — K760 Fatty (change of) liver, not elsewhere classified: Secondary | ICD-10-CM | POA: Diagnosis not present

## 2016-05-21 DIAGNOSIS — Z13220 Encounter for screening for lipoid disorders: Secondary | ICD-10-CM | POA: Diagnosis not present

## 2016-05-21 DIAGNOSIS — Z131 Encounter for screening for diabetes mellitus: Secondary | ICD-10-CM | POA: Diagnosis not present

## 2016-05-21 MED ORDER — LORAZEPAM 0.5 MG PO TABS: 0.5000 mg | ORAL_TABLET | Freq: Three times a day (TID) | ORAL | 2 refills | Status: DC | PRN

## 2016-05-22 ENCOUNTER — Encounter: Payer: Self-pay | Admitting: Physician Assistant

## 2016-05-22 DIAGNOSIS — D510 Vitamin B12 deficiency anemia due to intrinsic factor deficiency: Secondary | ICD-10-CM | POA: Insufficient documentation

## 2016-05-22 DIAGNOSIS — E559 Vitamin D deficiency, unspecified: Secondary | ICD-10-CM | POA: Insufficient documentation

## 2016-05-22 LAB — TSH: TSH: 1.44 m[IU]/L

## 2016-05-22 LAB — LIPID PANEL
CHOL/HDL RATIO: 2.9 ratio (ref <5.0)
Cholesterol: 159 mg/dL (ref <200)
HDL: 55 mg/dL (ref >50)
LDL CALC: 96 mg/dL
Triglycerides: 38 mg/dL (ref <150)
VLDL: 8 mg/dL (ref <30)

## 2016-05-22 LAB — COMPLETE METABOLIC PANEL WITH GFR
ALK PHOS: 32 U/L — AB (ref 33–115)
ALT: 10 U/L (ref 6–29)
AST: 16 U/L (ref 10–30)
Albumin: 4.2 g/dL (ref 3.6–5.1)
BUN: 11 mg/dL (ref 7–25)
CHLORIDE: 107 mmol/L (ref 98–110)
CO2: 25 mmol/L (ref 20–31)
Calcium: 9.1 mg/dL (ref 8.6–10.2)
Creat: 0.73 mg/dL (ref 0.50–1.10)
GFR, Est African American: >89 mL/min (ref >=60)
GLUCOSE: 88 mg/dL (ref 65–99)
POTASSIUM: 4 mmol/L (ref 3.5–5.3)
SODIUM: 141 mmol/L (ref 135–146)
Total Bilirubin: 0.5 mg/dL (ref 0.2–1.2)
Total Protein: 6.5 g/dL (ref 6.1–8.1)

## 2016-05-22 LAB — VITAMIN B12: Vitamin B-12: 189 pg/mL — ABNORMAL LOW (ref 200–1100)

## 2016-05-22 LAB — VITAMIN D 25 HYDROXY (VIT D DEFICIENCY, FRACTURES): VIT D 25 HYDROXY: 24 ng/mL — AB (ref 30–100)

## 2016-07-27 DIAGNOSIS — Z1231 Encounter for screening mammogram for malignant neoplasm of breast: Secondary | ICD-10-CM | POA: Diagnosis not present

## 2016-07-27 DIAGNOSIS — N920 Excessive and frequent menstruation with regular cycle: Secondary | ICD-10-CM | POA: Diagnosis not present

## 2016-07-27 LAB — HM MAMMOGRAPHY

## 2016-08-03 DIAGNOSIS — Z30430 Encounter for insertion of intrauterine contraceptive device: Secondary | ICD-10-CM | POA: Diagnosis not present

## 2016-08-03 DIAGNOSIS — N920 Excessive and frequent menstruation with regular cycle: Secondary | ICD-10-CM | POA: Diagnosis not present

## 2016-08-03 DIAGNOSIS — N938 Other specified abnormal uterine and vaginal bleeding: Secondary | ICD-10-CM | POA: Diagnosis not present

## 2016-08-27 DIAGNOSIS — Z30431 Encounter for routine checking of intrauterine contraceptive device: Secondary | ICD-10-CM | POA: Diagnosis not present

## 2016-09-23 ENCOUNTER — Encounter: Payer: Self-pay | Admitting: Physician Assistant

## 2016-12-21 ENCOUNTER — Encounter: Payer: Self-pay | Admitting: Physician Assistant

## 2016-12-21 ENCOUNTER — Ambulatory Visit (INDEPENDENT_AMBULATORY_CARE_PROVIDER_SITE_OTHER): Payer: BLUE CROSS/BLUE SHIELD | Admitting: Physician Assistant

## 2016-12-21 VITALS — BP 96/60 | HR 72 | Ht 67.0 in | Wt 181.0 lb

## 2016-12-21 DIAGNOSIS — R110 Nausea: Secondary | ICD-10-CM | POA: Diagnosis not present

## 2016-12-21 DIAGNOSIS — Z8249 Family history of ischemic heart disease and other diseases of the circulatory system: Secondary | ICD-10-CM | POA: Diagnosis not present

## 2016-12-21 DIAGNOSIS — R0602 Shortness of breath: Secondary | ICD-10-CM | POA: Diagnosis not present

## 2016-12-21 DIAGNOSIS — E538 Deficiency of other specified B group vitamins: Secondary | ICD-10-CM

## 2016-12-21 DIAGNOSIS — R002 Palpitations: Secondary | ICD-10-CM | POA: Diagnosis not present

## 2016-12-21 DIAGNOSIS — M25471 Effusion, right ankle: Secondary | ICD-10-CM | POA: Diagnosis not present

## 2016-12-21 LAB — TSH: TSH: 1.21 mIU/L

## 2016-12-21 LAB — CBC
HEMATOCRIT: 39.9 % (ref 35.0–45.0)
Hemoglobin: 12.9 g/dL (ref 11.7–15.5)
MCH: 29.5 pg (ref 27.0–33.0)
MCHC: 32.3 g/dL (ref 32.0–36.0)
MCV: 91.1 fL (ref 80.0–100.0)
MPV: 10.7 fL (ref 7.5–12.5)
PLATELETS: 212 10*3/uL (ref 140–400)
RBC: 4.38 MIL/uL (ref 3.80–5.10)
RDW: 13.7 % (ref 11.0–15.0)
WBC: 4.9 10*3/uL (ref 3.8–10.8)

## 2016-12-21 MED ORDER — CYANOCOBALAMIN 1000 MCG/ML IJ SOLN
1000.0000 ug | Freq: Once | INTRAMUSCULAR | Status: AC
  Administered 2016-12-21: 1000 ug via INTRAMUSCULAR

## 2016-12-21 NOTE — Progress Notes (Signed)
Subjective:    Patient ID: Joanna Baker, female    DOB: Apr 11, 1971, 46 y.o.   MRN: [id number]  HPI  Pt is a 46 yo female who presents to the clinic with heart concerns. For the last year on and off she feels like her heart skips out of rhythm. She comes in today because it was more severe yesterday. Yesterday her heart was pounding, she felt SOB most of the day, and nauseated. She hasn't felt great since Monday night had dinner and then loose stools the next day. She also has some occasional right ankle swelling with no reason. She has had no recent swelling. Her mother had SOB and PE with CHF. She is concerned. She feels a little crampy in her abdominal area but diarrhea has improved. No recent abx. She has a hx of low b12 and vitamin D. She admits she is not taking anything right now.   .. Active Ambulatory Problems    Diagnosis Date Noted  . Hemorrhoid 08/03/2011  . Right lumbar radiculitis 01/13/2013  . Fatty liver disease, nonalcoholic 92/33/0076  . Depression 12/11/2013  . Anxiety state, unspecified 12/11/2013  . Premenstrual symptom 11/07/2014  . ACL tear 07/19/2015  . Mass of mandible 09/02/2015  . Right-sided chest pain 05/18/2016  . Radicular syndrome of right leg 05/18/2016  . Pulsatile tinnitus of left ear 05/18/2016  . DUB (dysfunctional uterine bleeding) 05/21/2016  . Pernicious anemia 05/22/2016  . Vitamin D insufficiency 05/22/2016  . SOB (shortness of breath) 12/24/2016  . Palpitations 12/24/2016  . B12 deficiency 12/24/2016  . Right ankle swelling 12/24/2016  . Family history of CHF (congestive heart failure) 12/24/2016   Resolved Ambulatory Problems    Diagnosis Date Noted  . Right leg pain 02/09/2015  . Left knee injury 07/08/2015  . RUQ pain 09/02/2015   Past Medical History:  Diagnosis Date  . ACL injury tear   . Anxiety   . Fatty liver   . IBS (irritable bowel syndrome)   . TB (tuberculosis), treated        Review of Systems  All other systems  reviewed and are negative.      Objective:   Physical Exam  Constitutional: She is oriented to person, place, and time. She appears well-developed and well-nourished.  HENT:  Head: Normocephalic and atraumatic.  Cardiovascular: Normal rate, regular rhythm and normal heart sounds.   Pulmonary/Chest: Effort normal and breath sounds normal.  Neurological: She is alert and oriented to person, place, and time.  Psychiatric: She has a normal mood and affect. Her behavior is normal.          Assessment & Plan:  Marland KitchenMarland KitchenDiagnoses and all orders for this visit:  SOB (shortness of breath) -     EKG 12-Lead -     Comprehensive metabolic panel -     TSH -     Vit D  25 hydroxy (rtn osteoporosis monitoring) -     CBC -     Ferritin -     Sedimentation rate -     Troponin I -     Holter monitor - 24 hour; Future -     ECHOCARDIOGRAM COMPLETE; Future  Palpitations -     EKG 12-Lead -     Comprehensive metabolic panel -     TSH -     Vit D  25 hydroxy (rtn osteoporosis monitoring) -     CBC -     Ferritin -     Sedimentation  rate -     Holter monitor - 24 hour; Future -     ECHOCARDIOGRAM COMPLETE; Future  B12 deficiency -     cyanocobalamin ((VITAMIN B-12)) injection 1,000 mcg; Inject 1 mL (1,000 mcg total) into the muscle once.  Right ankle swelling -     ECHOCARDIOGRAM COMPLETE; Future  Family history of CHF (congestive heart failure) -     ECHOCARDIOGRAM COMPLETE; Future  Nausea -     Troponin I -     ECHOCARDIOGRAM COMPLETE; Future  EKG NSR, no arrhthymias, no ST elevation or depression, no signs of past ischemia.   Unclear etiology. I suspect she could be having some PAC's or PVC's. Since this is happening daily and Holter monitor should catch this. I will set her up with one. Due to family hx and dyspnea and edema will get echo of heart. I did not hear a murmur today.   Labs to look and electrolytes. Troponin for further reassurance to patient no ischemia going on.    Will make changes as we get labs and results. HO about PVC's given. Discussed looking at possible triggers.

## 2016-12-21 NOTE — Patient Instructions (Addendum)
holter monitor Echo b12 for 3 months and then recheck. Labs   Palpitations A palpitation is the feeling that your heartbeat is irregular or is faster than normal. It may feel like your heart is fluttering or skipping a beat. Palpitations are usually not a serious problem. They may be caused by many things, including smoking, caffeine, alcohol, stress, and certain medicines. Although most causes of palpitations are not serious, palpitations can be a sign of a serious medical problem. In some cases, you may need further medical evaluation. Follow these instructions at home: Pay attention to any changes in your symptoms. Take these actions to help with your condition:  Avoid the following: ? Caffeinated coffee, tea, soft drinks, diet pills, and energy drinks. ? Chocolate. ? Alcohol.  Do not use any tobacco products, such as cigarettes, chewing tobacco, and e-cigarettes. If you need help quitting, ask your health care provider.  Try to reduce your stress and anxiety. Things that can help you relax include: ? Yoga. ? Meditation. ? Physical activity, such as swimming, jogging, or walking. ? Biofeedback. This is a method that helps you learn to use your mind to control things in your body, such as your heartbeats.  Get plenty of rest and sleep.  Take over-the-counter and prescription medicines only as told by your health care provider.  Keep all follow-up visits as told by your health care provider. This is important.  Contact a health care provider if:  You continue to have a fast or irregular heartbeat after 24 hours.  Your palpitations occur more often. Get help right away if:  You have chest pain or shortness of breath.  You have a severe headache.  You feel dizzy or you faint. This information is not intended to replace advice given to you by your health care provider. Make sure you discuss any questions you have with your health care provider. Document Released: 06/29/2000  Document Revised: 12/05/2015 Document Reviewed: 03/17/2015 Elsevier Interactive Patient Education  2017 [employer].

## 2016-12-22 LAB — SEDIMENTATION RATE: Sed Rate: 1 mm/hr (ref 0–20)

## 2016-12-22 LAB — COMPREHENSIVE METABOLIC PANEL
ALK PHOS: 30 U/L — AB (ref 33–115)
ALT: 10 U/L (ref 6–29)
AST: 15 U/L (ref 10–30)
Albumin: 3.8 g/dL (ref 3.6–5.1)
BUN: 9 mg/dL (ref 7–25)
CALCIUM: 8.7 mg/dL (ref 8.6–10.2)
CHLORIDE: 107 mmol/L (ref 98–110)
CO2: 27 mmol/L (ref 20–31)
Creat: 0.65 mg/dL (ref 0.50–1.10)
GLUCOSE: 89 mg/dL (ref 65–99)
POTASSIUM: 3.8 mmol/L (ref 3.5–5.3)
Sodium: 142 mmol/L (ref 135–146)
Total Bilirubin: 0.4 mg/dL (ref 0.2–1.2)
Total Protein: 6.1 g/dL (ref 6.1–8.1)

## 2016-12-22 LAB — FERRITIN: Ferritin: 23 ng/mL (ref 10–232)

## 2016-12-22 LAB — TROPONIN I: Troponin I: <0.01 ng/mL (ref <=0.05)

## 2016-12-22 LAB — VITAMIN D 25 HYDROXY (VIT D DEFICIENCY, FRACTURES): Vit D, 25-Hydroxy: 24 ng/mL — ABNORMAL LOW (ref 30–100)

## 2016-12-24 ENCOUNTER — Telehealth: Payer: Self-pay | Admitting: Physician Assistant

## 2016-12-24 ENCOUNTER — Telehealth: Payer: Self-pay | Admitting: *Deleted

## 2016-12-24 ENCOUNTER — Encounter: Payer: Self-pay | Admitting: Physician Assistant

## 2016-12-24 DIAGNOSIS — M25471 Effusion, right ankle: Secondary | ICD-10-CM | POA: Insufficient documentation

## 2016-12-24 DIAGNOSIS — R0602 Shortness of breath: Secondary | ICD-10-CM

## 2016-12-24 DIAGNOSIS — R002 Palpitations: Secondary | ICD-10-CM

## 2016-12-24 DIAGNOSIS — Z8249 Family history of ischemic heart disease and other diseases of the circulatory system: Secondary | ICD-10-CM | POA: Insufficient documentation

## 2016-12-24 DIAGNOSIS — E538 Deficiency of other specified B group vitamins: Secondary | ICD-10-CM | POA: Insufficient documentation

## 2016-12-24 NOTE — Telephone Encounter (Signed)
Called patient and LVM for her to call me back to schedule echo.

## 2016-12-24 NOTE — Telephone Encounter (Signed)
I order holter and echo but cannot print from home. Will you please print orders for me. Thanks

## 2017-01-08 ENCOUNTER — Ambulatory Visit (HOSPITAL_COMMUNITY): Payer: BLUE CROSS/BLUE SHIELD | Attending: Cardiology

## 2017-01-08 ENCOUNTER — Ambulatory Visit (INDEPENDENT_AMBULATORY_CARE_PROVIDER_SITE_OTHER): Payer: BLUE CROSS/BLUE SHIELD

## 2017-01-08 DIAGNOSIS — R002 Palpitations: Secondary | ICD-10-CM | POA: Insufficient documentation

## 2017-01-08 DIAGNOSIS — R0602 Shortness of breath: Secondary | ICD-10-CM | POA: Insufficient documentation

## 2017-01-08 DIAGNOSIS — M25471 Effusion, right ankle: Secondary | ICD-10-CM

## 2017-01-08 DIAGNOSIS — Z8249 Family history of ischemic heart disease and other diseases of the circulatory system: Secondary | ICD-10-CM | POA: Diagnosis not present

## 2017-01-08 DIAGNOSIS — I071 Rheumatic tricuspid insufficiency: Secondary | ICD-10-CM | POA: Insufficient documentation

## 2017-01-08 DIAGNOSIS — I371 Nonrheumatic pulmonary valve insufficiency: Secondary | ICD-10-CM | POA: Diagnosis not present

## 2017-01-11 DIAGNOSIS — N938 Other specified abnormal uterine and vaginal bleeding: Secondary | ICD-10-CM | POA: Diagnosis not present

## 2017-01-18 ENCOUNTER — Other Ambulatory Visit: Payer: Self-pay | Admitting: Physician Assistant

## 2017-01-18 DIAGNOSIS — F411 Generalized anxiety disorder: Secondary | ICD-10-CM

## 2017-03-14 DIAGNOSIS — N938 Other specified abnormal uterine and vaginal bleeding: Secondary | ICD-10-CM | POA: Diagnosis not present

## 2017-03-25 ENCOUNTER — Ambulatory Visit (INDEPENDENT_AMBULATORY_CARE_PROVIDER_SITE_OTHER): Payer: BLUE CROSS/BLUE SHIELD | Admitting: Physician Assistant

## 2017-03-25 VITALS — BP 100/54 | HR 90 | Wt 184.0 lb

## 2017-03-25 DIAGNOSIS — E538 Deficiency of other specified B group vitamins: Secondary | ICD-10-CM

## 2017-03-25 MED ORDER — CYANOCOBALAMIN 1000 MCG/ML IJ SOLN
1000.0000 ug | Freq: Once | INTRAMUSCULAR | Status: AC
  Administered 2017-03-25: 1000 ug via INTRAMUSCULAR

## 2017-03-25 NOTE — Progress Notes (Signed)
   Subjective:    Patient ID: Joanna Baker, female    DOB: [birth year], 46 y.o.   MRN: [id number]  HPI  [name] is here for a vitamin B 12 injection. Denies muscle cramps, weakness or irregular heart rate.    Review of Systems     Objective:   Physical Exam        Assessment & Plan:  Patient tolerated injection well without complications. Patient advised to schedule next injection. She states she only gets the vitamin B 12 once every 3 months.   Agree with above plan. Iran Planas PA-c

## 2017-04-08 ENCOUNTER — Other Ambulatory Visit: Payer: Self-pay | Admitting: Family Medicine

## 2017-04-08 DIAGNOSIS — F411 Generalized anxiety disorder: Secondary | ICD-10-CM

## 2017-06-21 ENCOUNTER — Ambulatory Visit (INDEPENDENT_AMBULATORY_CARE_PROVIDER_SITE_OTHER): Payer: BLUE CROSS/BLUE SHIELD | Admitting: Physician Assistant

## 2017-06-21 ENCOUNTER — Encounter: Payer: Self-pay | Admitting: Physician Assistant

## 2017-06-21 VITALS — BP 118/55 | HR 90 | Ht 67.0 in | Wt 187.0 lb

## 2017-06-21 DIAGNOSIS — Z1231 Encounter for screening mammogram for malignant neoplasm of breast: Secondary | ICD-10-CM

## 2017-06-21 DIAGNOSIS — M25562 Pain in left knee: Secondary | ICD-10-CM

## 2017-06-21 DIAGNOSIS — F411 Generalized anxiety disorder: Secondary | ICD-10-CM

## 2017-06-21 DIAGNOSIS — E538 Deficiency of other specified B group vitamins: Secondary | ICD-10-CM

## 2017-06-21 MED ORDER — CYANOCOBALAMIN 1000 MCG/ML IJ SOLN
1000.0000 ug | Freq: Once | INTRAMUSCULAR | Status: AC
  Administered 2017-06-21: 1000 ug via INTRAMUSCULAR

## 2017-06-21 MED ORDER — LORAZEPAM 0.5 MG PO TABS: 0.5000 mg | ORAL_TABLET | Freq: Three times a day (TID) | ORAL | 2 refills | Status: DC | PRN

## 2017-06-21 NOTE — Patient Instructions (Signed)
Lateral Collateral Knee Ligament Sprain, Phase I Rehab Ask your health care provider which exercises are safe for you. Do exercises exactly as told by your health care provider and adjust them as directed. It is normal to feel mild stretching, pulling, tightness, or discomfort as you do these exercises, but you should stop right away if you feel sudden pain or your pain gets worse.Do not begin these exercises until told by your health care provider. Stretching and range of motion exercises These exercises warm up your muscles and joints and improve the movement and flexibility of your knee. These exercises also help to relieve pain, numbness, and tingling. Exercise A: Heel slide  1. Lie on your back with both knees straight. If this causes back discomfort, bend your healthy knee, placing your foot flat on the floor. 2. Slowly slide your left / right heel back toward your buttocks until you feel a gentle stretch in the front of your knee or thigh. 3. Hold this position for __________ seconds. 4. Slowly slide your left / right heel to the starting position. Repeat __________ times. Complete this exercise __________ times a day. Strengthening exercises These exercises build strength and endurance in your knee. Endurance is the ability to use your muscles for a long time, even after they get tired. Exercise B: Straight leg raises ( quadriceps) 1. Lie on your back with your left / right leg extended and your other knee bent. 2. Tense the muscles in the front of your left / right thigh. You should see your kneecap slide up or see increased dimpling just above the knee. Your thigh may even shake a bit. 3. Keep these muscles tight as you raise your leg 4-6 inches (10-15 cm) off the floor. Do not let your knee bend. If you cannot do this exercise without bending your knee, tighten the muscles in the front of your left / right thigh but do not lift your leg. 4. Hold this position for __________  seconds. 5. Keep these muscles tense as you lower your leg. 6. Relax the muscles slowly and completely. Repeat __________ times. Complete this exercise __________ times a day. Exercise C: Hamstring curls  If told by your health care provider, do this exercise while wearing ankle weights. Begin with __________ weights. Then increase the weight by 1 lb (0.5 kg) increments. Do not wear ankle weights that are more than __________. 1. Lie on your abdomen with your legs straight. 2. Bend your left / right knee as far as you can comfortably do that. Keep your hips flat on the surface that is under them. When you bend your knee, bring your foot straight toward your buttock. Do not let it fall in or out. 3. Hold this position for __________ seconds. 4. Slowly lower your leg to the starting position. Repeat __________ times. Complete this exercise __________ times a day. Exercise D: Bridge ( hip extensors) 1. Lie on your back on a firm surface with your knees bent and your feet flat on the floor. 2. Tighten your buttocks muscles and lift your bottom off the floor until your trunk is level with your thighs. ? Do not arch your back. ? You should feel the muscles working in your buttocks and the back of your thighs. If you do not feel a stretch in these muscles, slide your feet 1-2 inches (2.5-5 cm) farther away from your buttocks. 3. Hold this position for __________ seconds. 4. Slowly lower your hips to the starting position. 5. Let  your buttocks muscles relax completely between repetitions. 6. If this exercise is too easy, try doing it with your arms crossed over your chest or by lifting one leg while your bottom is up off the floor. Repeat __________ times. Complete this exercise __________ times a day. Exercise E: Heel raise 1. Stand with your feet shoulder-width apart. 2. Keep your weight spread evenly over the width of your feet while you rise up on your toes. Use a wall or table to steady  yourself, but try not to use it very much for support. 3. If this exercise is too easy, shift your weight toward your left / right leg until you feel challenged. 4. Hold this position for __________ seconds. 5. Slowly lower yourself to the starting position. Repeat __________ times. Complete this exercise __________ times a day. This information is not intended to replace advice given to you by your health care provider. Make sure you discuss any questions you have with your health care provider. Document Released: 07/02/2005 Document Revised: 03/08/2016 Document Reviewed: 05/14/2015 Elsevier Interactive Patient Education  2018 [employer].

## 2017-06-21 NOTE — Progress Notes (Signed)
Subjective:    Patient ID: Joanna Baker, female    DOB: 07-27-1970, 46 y.o.   MRN: [id number]  HPI  Pt is a 46 yo female who presents to the clinic for 6 month follow up.   She needs refill on lorazepam. She uses as needed for anxiety and as needed sleep.  She has only used once or twice a week.   She needs a b12 shot. Her last one was in September. It seemed to help with energy but she thought we told her not to come back for 3 months. Looking back at notes b12 shot to get monthly and recheck labs in 3 months.   She does have some new left lateral knee pain more below knee. No known injury or trauma. She does have a hx of bilateral ACL tears. No sugery needed for repair. Sitting makes worse. Seems to better with walking.   .. Active Ambulatory Problems    Diagnosis Date Noted  . Hemorrhoid 08/03/2011  . Right lumbar radiculitis 01/13/2013  . Fatty liver disease, nonalcoholic 42/68/3419  . Depression 12/11/2013  . Anxiety state, unspecified 12/11/2013  . Premenstrual symptom 11/07/2014  . ACL tear 07/19/2015  . Mass of mandible 09/02/2015  . Right-sided chest pain 05/18/2016  . Radicular syndrome of right leg 05/18/2016  . Pulsatile tinnitus of left ear 05/18/2016  . DUB (dysfunctional uterine bleeding) 05/21/2016  . Pernicious anemia 05/22/2016  . Vitamin D insufficiency 05/22/2016  . SOB (shortness of breath) 12/24/2016  . Palpitations 12/24/2016  . B12 deficiency 12/24/2016  . Right ankle swelling 12/24/2016  . Family history of CHF (congestive heart failure) 12/24/2016   Resolved Ambulatory Problems    Diagnosis Date Noted  . Right leg pain 02/09/2015  . Left knee injury 07/08/2015  . RUQ pain 09/02/2015   Past Medical History:  Diagnosis Date  . ACL injury tear   . Anxiety   . Fatty liver   . IBS (irritable bowel syndrome)   . TB (tuberculosis), treated        Review of Systems  All other systems reviewed and are negative.      Objective:   Physical  Exam  Constitutional: She is oriented to person, place, and time. She appears well-developed and well-nourished.  HENT:  Head: Normocephalic and atraumatic.  Cardiovascular: Normal rate, regular rhythm and normal heart sounds.  Pulmonary/Chest: Effort normal and breath sounds normal.  Musculoskeletal:  NROM of both knees.  No joint tenderness.  Tenderness to palpation over left lateral knee at insertion of LCL to the tibia.  No swelling.  Bilateral knees with laxity.   Neurological: She is alert and oriented to person, place, and time.  Psychiatric: She has a normal mood and affect. Her behavior is normal.          Assessment & Plan:  Marland KitchenMarland KitchenDiagnoses and all orders for this visit:  Acute pain of left knee  Anxiety state -     LORazepam (ATIVAN) 0.5 MG tablet; Take 1 tablet (0.5 mg total) by mouth every 8 (eight) hours as needed. for anxiety  Visit for screening mammogram -     MM SCREENING BREAST TOMO BILATERAL  B12 deficiency -     cyanocobalamin ((VITAMIN B-12)) injection 1,000 mcg   Discussed knee pain. Pain seems to be due to some LCL strain. She does have hx of weak ligaments. Start with exercises targeted at [facility]. If no improvement will get xray/mri. Consider sports medicine referral. NSAID and icing as needed.  Ativan refilled for as needed. Continue to use sparingly.   Discussed b12 in 2 weeks and then monthly. Recheck labs in 3 months.   Marland Kitchen.Spent 30 minutes with patient and greater than 50 percent of visit spent counseling patient regarding treatment plan.

## 2017-06-22 ENCOUNTER — Encounter: Payer: Self-pay | Admitting: Physician Assistant

## 2017-07-02 ENCOUNTER — Ambulatory Visit (INDEPENDENT_AMBULATORY_CARE_PROVIDER_SITE_OTHER): Payer: BLUE CROSS/BLUE SHIELD | Admitting: Physician Assistant

## 2017-07-02 VITALS — BP 106/67 | HR 91 | Resp 18 | Wt 190.0 lb

## 2017-07-02 DIAGNOSIS — E538 Deficiency of other specified B group vitamins: Secondary | ICD-10-CM | POA: Diagnosis not present

## 2017-07-02 MED ORDER — CYANOCOBALAMIN 1000 MCG/ML IJ SOLN
1000.0000 ug | Freq: Once | INTRAMUSCULAR | Status: AC
  Administered 2017-07-02: 1000 ug via INTRAMUSCULAR

## 2017-07-02 NOTE — Progress Notes (Signed)
   Subjective:    Patient ID: Joanna Baker, female    DOB: 03-03-1971, 46 y.o.   MRN: [id number]  HPI  Joanna Baker is here for a vitamin B 12 injection. Denies muscle cramps, weakness or irregular heart rate.   Review of Systems     Objective:   Physical Exam        Assessment & Plan:  Vitamin B 12 deficiency - Patient tolerated injection well without complications. Patient advised to schedule next injection 30 days from today.

## 2017-07-25 ENCOUNTER — Ambulatory Visit (INDEPENDENT_AMBULATORY_CARE_PROVIDER_SITE_OTHER): Payer: BLUE CROSS/BLUE SHIELD

## 2017-07-25 DIAGNOSIS — R928 Other abnormal and inconclusive findings on diagnostic imaging of breast: Secondary | ICD-10-CM | POA: Diagnosis not present

## 2017-07-25 DIAGNOSIS — Z1231 Encounter for screening mammogram for malignant neoplasm of breast: Secondary | ICD-10-CM | POA: Diagnosis not present

## 2017-07-25 LAB — HM MAMMOGRAPHY

## 2017-07-26 ENCOUNTER — Other Ambulatory Visit: Payer: Self-pay | Admitting: *Deleted

## 2017-07-26 ENCOUNTER — Encounter: Payer: Self-pay | Admitting: Physician Assistant

## 2017-07-26 ENCOUNTER — Other Ambulatory Visit: Payer: Self-pay | Admitting: Physician Assistant

## 2017-07-26 DIAGNOSIS — R921 Mammographic calcification found on diagnostic imaging of breast: Secondary | ICD-10-CM | POA: Insufficient documentation

## 2017-07-26 DIAGNOSIS — R928 Other abnormal and inconclusive findings on diagnostic imaging of breast: Secondary | ICD-10-CM

## 2017-07-26 MED ORDER — CYANOCOBALAMIN 1000 MCG/ML IJ SOLN: 1000.0000 ug | INTRAMUSCULAR | 0 refills | Status: DC

## 2017-07-30 ENCOUNTER — Other Ambulatory Visit: Payer: Self-pay | Admitting: Physician Assistant

## 2017-07-30 ENCOUNTER — Ambulatory Visit
Admission: RE | Admit: 2017-07-30 | Discharge: 2017-07-30 | Disposition: A | Discharge status: Home / Self Care | Payer: BLUE CROSS/BLUE SHIELD | Source: Ambulatory Visit | Attending: Physician Assistant | Admitting: Physician Assistant

## 2017-07-30 DIAGNOSIS — R928 Other abnormal and inconclusive findings on diagnostic imaging of breast: Secondary | ICD-10-CM

## 2017-07-30 DIAGNOSIS — R921 Mammographic calcification found on diagnostic imaging of breast: Secondary | ICD-10-CM

## 2017-08-02 ENCOUNTER — Ambulatory Visit
Admission: RE | Admit: 2017-08-02 | Discharge: 2017-08-02 | Disposition: A | Discharge status: Home / Self Care | Payer: BLUE CROSS/BLUE SHIELD | Source: Ambulatory Visit | Attending: Physician Assistant | Admitting: Physician Assistant

## 2017-08-02 DIAGNOSIS — N63 Unspecified lump in unspecified breast: Secondary | ICD-10-CM | POA: Diagnosis not present

## 2017-08-02 DIAGNOSIS — R921 Mammographic calcification found on diagnostic imaging of breast: Secondary | ICD-10-CM

## 2017-08-02 DIAGNOSIS — N6092 Unspecified benign mammary dysplasia of left breast: Secondary | ICD-10-CM | POA: Diagnosis not present

## 2017-08-05 ENCOUNTER — Other Ambulatory Visit: Payer: Self-pay | Admitting: Physician Assistant

## 2017-08-05 DIAGNOSIS — N6092 Unspecified benign mammary dysplasia of left breast: Secondary | ICD-10-CM

## 2017-08-05 NOTE — Progress Notes (Signed)
Breast clinic called and patient has results of atypical dutal hyperplasia and needs referral to surgeon in [facility]. Done today.

## 2017-08-22 DIAGNOSIS — Z6830 Body mass index (BMI) 30.0-30.9, adult: Secondary | ICD-10-CM | POA: Diagnosis not present

## 2017-08-22 DIAGNOSIS — Z01419 Encounter for gynecological examination (general) (routine) without abnormal findings: Secondary | ICD-10-CM | POA: Diagnosis not present

## 2017-09-04 DIAGNOSIS — N6092 Unspecified benign mammary dysplasia of left breast: Secondary | ICD-10-CM | POA: Diagnosis not present

## 2017-09-06 DIAGNOSIS — N6012 Diffuse cystic mastopathy of left breast: Secondary | ICD-10-CM | POA: Diagnosis not present

## 2017-09-20 ENCOUNTER — Ambulatory Visit (INDEPENDENT_AMBULATORY_CARE_PROVIDER_SITE_OTHER): Payer: BLUE CROSS/BLUE SHIELD | Admitting: Physician Assistant

## 2017-09-20 ENCOUNTER — Encounter: Payer: Self-pay | Admitting: Physician Assistant

## 2017-09-20 ENCOUNTER — Ambulatory Visit (INDEPENDENT_AMBULATORY_CARE_PROVIDER_SITE_OTHER): Payer: BLUE CROSS/BLUE SHIELD

## 2017-09-20 VITALS — BP 100/68 | HR 92 | Temp 98.1°F | Resp 18 | Wt 194.0 lb

## 2017-09-20 DIAGNOSIS — R509 Fever, unspecified: Secondary | ICD-10-CM

## 2017-09-20 DIAGNOSIS — R05 Cough: Secondary | ICD-10-CM | POA: Diagnosis not present

## 2017-09-20 DIAGNOSIS — Z0189 Encounter for other specified special examinations: Secondary | ICD-10-CM

## 2017-09-20 DIAGNOSIS — J111 Influenza due to unidentified influenza virus with other respiratory manifestations: Secondary | ICD-10-CM

## 2017-09-20 DIAGNOSIS — R69 Illness, unspecified: Secondary | ICD-10-CM

## 2017-09-20 LAB — POCT INFLUENZA A/B
Influenza A, POC: NEGATIVE
Influenza B, POC: NEGATIVE

## 2017-09-20 MED ORDER — GUAIFENESIN ER 600 MG PO TB12: 1200.0000 mg | ORAL_TABLET | Freq: Two times a day (BID) | ORAL | Status: DC

## 2017-09-20 MED ORDER — OSELTAMIVIR PHOSPHATE 75 MG PO CAPS: 75.0000 mg | ORAL_CAPSULE | Freq: Two times a day (BID) | ORAL | 0 refills | Status: DC

## 2017-09-20 NOTE — Patient Instructions (Signed)

## 2017-09-20 NOTE — Progress Notes (Signed)
HPI:                                                                Joanna Baker is a 47 y.o. female who presents to [facility] today for influenza-like symptoms  Influenza  This is a new problem. The current episode started yesterday. The problem occurs constantly. The problem has been unchanged. Associated symptoms include chills, congestion, coughing, diaphoresis, fatigue, myalgias and a sore throat. Pertinent negatives include no abdominal pain, change in bowel habit, chest pain, nausea, neck pain or vomiting. Nothing aggravates the symptoms. She has tried NSAIDs (OTC cold&sinus) for the symptoms. The treatment provided mild relief.  She is requesting a chest x-ray   Depression screen [facility] 2/9 03/25/2017  Decreased Interest 1  Down, Depressed, Hopeless 0  PHQ - 2 Score 1    No flowsheet data found.    Past Medical History:  Diagnosis Date  . ACL injury tear    Right knee, not repaired.    . Anxiety   . Fatty liver   . IBS (irritable bowel syndrome)   . TB (tuberculosis), treated     6 month of INH at age 88   Past Surgical History:  Procedure Laterality Date  . TONSILLECTOMY  1993   Social History   Tobacco Use  . Smoking status: Former Smoker    Packs/day: 0.25    Years: 5.00    Pack years: 1.25    Types: Cigarettes  . Smokeless tobacco: Never Used  Substance Use Topics  . Alcohol use: Yes    Alcohol/week: 1.2 oz    Types: 2 Cans of beer per week   family history includes Alcohol abuse in her father; Depression in her paternal grandfather; Heart attack in her maternal grandmother; Pulmonary embolism in her mother; Stroke (age of onset: 11) in her mother.    ROS: negative except as noted in the HPI  Medications: Current Outpatient Medications  Medication Sig Dispense Refill  . acetaminophen (TYLENOL) 500 MG tablet Take 500 mg by mouth every 6 (six) hours as needed.    . Calcium Carbonate-Vitamin D  (CALCIUM-VITAMIN D) 500-200 MG-UNIT per tablet Take 1 tablet by mouth daily.      . cyanocobalamin (,VITAMIN B-12,) 1000 MCG/ML injection Inject 1 mL (1,000 mcg total) into the muscle every 30 (thirty) days. Please include syringes and 1" 25 gauge needles 10 mL 0  . LORazepam (ATIVAN) 0.5 MG tablet Take 1 tablet (0.5 mg total) by mouth every 8 (eight) hours as needed. for anxiety 30 tablet 2  . Multiple Vitamin (MULTIVITAMIN WITH MINERALS) TABS Take 1 tablet by mouth daily.    . naproxen sodium (ANAPROX) 220 MG tablet Take 220 mg by mouth as needed.     Marland Kitchen guaiFENesin (MUCINEX) 600 MG 12 hr tablet Take 2 tablets (1,200 mg total) by mouth 2 (two) times daily.    Marland Kitchen oseltamivir (TAMIFLU) 75 MG capsule Take 1 capsule (75 mg total) by mouth 2 (two) times daily. 10 capsule 0   No current facility-administered medications for this visit.    Allergies  Allergen Reactions  . Influenza Vaccines Anaphylaxis  . Iodine Anaphylaxis  . Chantix [Varenicline Tartrate] Other (See Comments)    Mood changes.   Marland Kitchen  Lobster [Shellfish Allergy] Itching  . Penicillins Hives  . Prednisone Other (See Comments)    Mood changes/extreme to the point of wanting to kill herself.   . Prozac [Fluoxetine Hcl]     Palpitations.  . Suboxone [Buprenorphine Hcl-Naloxone Hcl]   . Sulfa Antibiotics Hives       Objective:  BP 100/68   Pulse 92   Temp 98.1 F (36.7 C) (Oral)   Resp 18   Wt 194 lb (88 kg)   LMP 09/02/2017   SpO2 98%   BMI 30.38 kg/m  Gen:  alert, ill-appearing, not toxic-appearing, no distress, appropriate for age 40: head normocephalic without obvious abnormality, conjunctiva and cornea clear, oropharynx with erythema, no edema or exudates, nasal mucosa edematous, neck supple, no cervical adenopathy, trachea midline Pulm: Normal work of breathing, normal phonation, clear to auscultation bilaterally, no wheezes, rales or rhonchi CV: Normal rate, regular rhythm, s1 and s2 distinct, no murmurs, clicks  or rubs  Neuro: alert and oriented x 3, no tremor MSK: extremities atraumatic, normal gait and station Skin: intact, no rashes on exposed skin, no jaundice, no cyanosis   Results for orders placed or performed in visit on 09/20/17 (from the past 72 hour(s))  POCT Influenza A/B     Status: Normal   Collection Time: 09/20/17  9:47 AM  Result Value Ref Range   Influenza A, POC Negative Negative   Influenza B, POC Negative Negative   Dg Chest 2 View  Result Date: 09/20/2017 CLINICAL DATA:  Fever, cough. EXAM: CHEST - 2 VIEW COMPARISON:  Radiographs of July 05, 2014. FINDINGS: The heart size and mediastinal contours are within normal limits. Both lungs are clear. No pneumothorax or pleural effusion is noted. The visualized skeletal structures are unremarkable. IMPRESSION: No active cardiopulmonary disease. Electronically Signed   By: Marijo Conception, M.D.   On: 09/20/2017 10:25      Assessment and Plan: 47 y.o. female with   1. Influenza-like illness - vitals are within normal limits. No fever, tachycardia, hypoxia or underlying pulmonary disease to warrant CXR. Patient would like one - she is in the window for Tamiflu and even though POC test is negative, she has hallmark symptoms. Continue symptomatic care with hydration, Mucinex, and cough suppressant at bedtime - oseltamivir (TAMIFLU) 75 MG capsule; Take 1 capsule (75 mg total) by mouth 2 (two) times daily.  Dispense: 10 capsule; Refill: 0 - guaiFENesin (MUCINEX) 600 MG 12 hr tablet; Take 2 tablets (1,200 mg total) by mouth 2 (two) times daily. - DG Chest 2 View - POCT Influenza A/B  2. Patient request for diagnostic testing - DG Chest 2 View, personally reviewed CXR which was negative for infiltrate or acute pulmonary process   Patient education and anticipatory guidance given Patient agrees with treatment plan Follow-up as needed if symptoms worsen or fail to improve  Darlyne Russian PA-C

## 2017-09-20 NOTE — Progress Notes (Signed)
Good morning,  Your chest x-ray is normal. No evidence of pneumonia.  Hope you feel better soon! Evlyn Clines

## 2017-09-25 DIAGNOSIS — Z882 Allergy status to sulfonamides status: Secondary | ICD-10-CM | POA: Diagnosis not present

## 2017-09-25 DIAGNOSIS — Z880 Allergy status to penicillin: Secondary | ICD-10-CM | POA: Diagnosis not present

## 2017-09-25 DIAGNOSIS — N6092 Unspecified benign mammary dysplasia of left breast: Secondary | ICD-10-CM | POA: Diagnosis not present

## 2017-09-25 DIAGNOSIS — N6012 Diffuse cystic mastopathy of left breast: Secondary | ICD-10-CM | POA: Diagnosis not present

## 2017-09-25 DIAGNOSIS — Z87891 Personal history of nicotine dependence: Secondary | ICD-10-CM | POA: Diagnosis not present

## 2017-09-25 DIAGNOSIS — Z0389 Encounter for observation for other suspected diseases and conditions ruled out: Secondary | ICD-10-CM | POA: Diagnosis not present

## 2017-10-14 DIAGNOSIS — Z880 Allergy status to penicillin: Secondary | ICD-10-CM | POA: Diagnosis not present

## 2017-10-14 DIAGNOSIS — R001 Bradycardia, unspecified: Secondary | ICD-10-CM | POA: Diagnosis not present

## 2017-10-14 DIAGNOSIS — Z887 Allergy status to serum and vaccine status: Secondary | ICD-10-CM | POA: Diagnosis not present

## 2017-10-14 DIAGNOSIS — Z888 Allergy status to other drugs, medicaments and biological substances status: Secondary | ICD-10-CM | POA: Diagnosis not present

## 2017-10-14 DIAGNOSIS — R531 Weakness: Secondary | ICD-10-CM | POA: Diagnosis not present

## 2017-10-14 DIAGNOSIS — Z79899 Other long term (current) drug therapy: Secondary | ICD-10-CM | POA: Diagnosis not present

## 2017-10-14 DIAGNOSIS — F419 Anxiety disorder, unspecified: Secondary | ICD-10-CM | POA: Diagnosis not present

## 2017-10-14 DIAGNOSIS — F329 Major depressive disorder, single episode, unspecified: Secondary | ICD-10-CM | POA: Diagnosis not present

## 2017-10-14 DIAGNOSIS — R002 Palpitations: Secondary | ICD-10-CM | POA: Diagnosis not present

## 2017-10-14 DIAGNOSIS — Z87891 Personal history of nicotine dependence: Secondary | ICD-10-CM | POA: Diagnosis not present

## 2017-10-14 DIAGNOSIS — Z885 Allergy status to narcotic agent status: Secondary | ICD-10-CM | POA: Diagnosis not present

## 2017-10-14 DIAGNOSIS — Z91013 Allergy to seafood: Secondary | ICD-10-CM | POA: Diagnosis not present

## 2017-10-14 DIAGNOSIS — I493 Ventricular premature depolarization: Secondary | ICD-10-CM | POA: Diagnosis not present

## 2017-10-14 DIAGNOSIS — Z91041 Radiographic dye allergy status: Secondary | ICD-10-CM | POA: Diagnosis not present

## 2017-10-14 DIAGNOSIS — K760 Fatty (change of) liver, not elsewhere classified: Secondary | ICD-10-CM | POA: Diagnosis not present

## 2017-10-23 NOTE — Telephone Encounter (Signed)
Done

## 2017-10-25 ENCOUNTER — Encounter: Payer: Self-pay | Admitting: Physician Assistant

## 2017-10-25 ENCOUNTER — Ambulatory Visit (INDEPENDENT_AMBULATORY_CARE_PROVIDER_SITE_OTHER): Payer: BLUE CROSS/BLUE SHIELD | Admitting: Physician Assistant

## 2017-10-25 VITALS — BP 94/53 | HR 81 | Wt 191.0 lb

## 2017-10-25 DIAGNOSIS — E538 Deficiency of other specified B group vitamins: Secondary | ICD-10-CM

## 2017-10-25 DIAGNOSIS — Z13220 Encounter for screening for lipoid disorders: Secondary | ICD-10-CM | POA: Diagnosis not present

## 2017-10-25 DIAGNOSIS — I493 Ventricular premature depolarization: Secondary | ICD-10-CM | POA: Diagnosis not present

## 2017-10-25 NOTE — Patient Instructions (Signed)
Premature Ventricular Contraction A premature ventricular contraction (PVC) is a common irregularity in the normal heart rhythm. These contractions are extra heartbeats that start in the heart ventricles and occur too early in the normal sequence. During the PVC, the heart's normal electrical pathway is not used, so the beat is shorter and less effective. In most cases, these contractions come and go and do not require treatment. What are the causes? In many cases, the cause may not be known. Common causes of the condition include:  Smoking.  Drinking alcohol.  Caffeine.  Certain medicines.  Some illegal drugs.  Stress.  Certain medical conditions can also cause PVCs:  Changes in minerals in the blood (electrolytes).  Heart failure.  Heart valve problems.  Low blood oxygen levels or high carbon dioxide levels.  Heart attack, or coronary artery disease.  What are the signs or symptoms? The main symptom of this condition is a fast or skipped heartbeat (palpitations). Other symptoms include:  Chest pain.  Shortness of breath.  Feeling tired.  Dizziness.  In some cases, there are no symptoms. How is this diagnosed? This condition may be diagnosed based on:  Your medical history.  A physical exam. During the exam, the health care provider will check for irregular heartbeats.  Tests, such as: ? An ECG (electrocardiogram) to monitor the electrical activity of your heart. ? Holter monitor testing. This involves wearing a device that clips to your clothing and monitors the electrical activity of your heart over longer periods of time. ? Stress tests to see how exercise affects your heart rhythm and blood supply. ? Echocardiogram. This test uses sound waves (ultrasound) to produce an image of your heart. ? Electrophysiology study. This test checks the electric pathways in your heart.  How is this treated? Treatment depends on any underlying conditions, the type of PVCs  that you are having, and how much the symptoms are interfering with your daily life. Possible treatments include:  Avoiding things that can trigger the premature contractions, such as caffeine or alcohol.  Medicines. These may be given if symptoms are severe or if the extra heartbeats are frequent.  Treatment for any underlying condition that is found to be the cause of the contractions.  Catheter ablation. This procedure destroys the heart tissues that send abnormal signals.  In some cases, no treatment is required. Follow these instructions at home: Lifestyle Follow these instructions as told by your health care provider:  Do not use any products that contain nicotine or tobacco, such as cigarettes and e-cigarettes. If you need help quitting, ask your health care provider.  If caffeine triggers episodes of PVC, do not eat, drink, or use anything with caffeine in it.  If caffeine does not seem to trigger episodes, consume caffeine in moderation.  If alcohol triggers episodes of PVC, do not drink alcohol.  If alcohol does not seem to trigger episodes, limit alcohol intake to no more than 1 drink a day for nonpregnant women and 2 drinks a day for men. One drink equals 12 oz of beer, 5 oz of wine, or 1 oz of hard liquor.  Exercise regularly. Ask your health care provider what type of exercise is safe for you.  Find healthy ways to manage stress. Avoid stressful situations when possible.  Try to get at least 7-9 hours of sleep each night, or as much as recommended by your health care provider.  Do not use illegal drugs.  General instructions  Take over-the-counter and prescription medicines only   as told by your health care provider.  Keep all follow-up visits as told by your health care provider. This is important. Get help right away if:  You feel palpitations that are frequent or continual.  You have chest pain.  You have shortness of breath.  You have sweating for no  reason.  You have nausea and vomiting.  You become light-headed or you faint. This information is not intended to replace advice given to you by your health care provider. Make sure you discuss any questions you have with your health care provider. Document Released: 02/17/2004 Document Revised: 02/24/2016 Document Reviewed: 12/07/2015 Elsevier Interactive Patient Education  2018 [employer]  

## 2017-10-25 NOTE — Progress Notes (Signed)
Subjective:    Patient ID: Joanna Baker, female    DOB: 1970/11/01, 47 y.o.   MRN: [id number]  HPI Whitney presents today for a follow up after a hospital visit (on 10/14/17). She was complaining of palpitations that had been on and off for a year (in which she had a holter monitor in 2018 that showed sinus rhythm with sinus tachycardia with less than 1 percent PAC's/PVC's) but over the past few days to week she had been feeling thing more often. She described them as a sensation of pounding and that she felt radiation to her neck and she felt like her neck was swelling up. She had also been taking a "liver detox" that contained multiple herbs.   Since the hospital visit and she has discontinued the liver detox she has felt better and has noticed the palpitations much less frequently.    At this hospital she had a negative CXR, EKG showed sinus brady cardia with frequent PVC's.  She is prescribed PRN Ativan for anxiety and was instructed to take that BID for the palpitations after the visit. She does not notice any worrisome anxiety.   She is taking b12 shots at home with no problems. She has notice more energy and feels much better on them. She does need follow up to make sure in normal range.  .. Active Ambulatory Problems    Diagnosis Date Noted  . Hemorrhoid 08/03/2011  . Right lumbar radiculitis 01/13/2013  . Fatty liver disease, nonalcoholic 41/66/0630  . Depression 12/11/2013  . Anxiety state, unspecified 12/11/2013  . Premenstrual symptom 11/07/2014  . ACL tear 07/19/2015  . Mass of mandible 09/02/2015  . Right-sided chest pain 05/18/2016  . Radicular syndrome of right leg 05/18/2016  . Pulsatile tinnitus of left ear 05/18/2016  . DUB (dysfunctional uterine bleeding) 05/21/2016  . Pernicious anemia 05/22/2016  . Vitamin D insufficiency 05/22/2016  . SOB (shortness of breath) 12/24/2016  . Palpitations 12/24/2016  . B12 deficiency 12/24/2016  . Right ankle swelling 12/24/2016   . Family history of CHF (congestive heart failure) 12/24/2016  . Breast calcification, left 07/26/2017  . Atypical ductal hyperplasia of left breast 08/05/2017   Resolved Ambulatory Problems    Diagnosis Date Noted  . Right leg pain 02/09/2015  . Left knee injury 07/08/2015  . RUQ pain 09/02/2015   Past Medical History:  Diagnosis Date  . ACL injury tear   . Anxiety   . Fatty liver   . IBS (irritable bowel syndrome)   . TB (tuberculosis), treated        Review of Systems  Constitutional: Negative for activity change, appetite change, fatigue and unexpected weight change.  Eyes: Negative for visual disturbance.  Respiratory: Negative for cough and shortness of breath.   Cardiovascular: Positive for palpitations. Negative for chest pain.  Gastrointestinal: Negative for abdominal pain, nausea and vomiting.  Neurological: Negative for dizziness and headaches.       Objective:   Physical Exam  Constitutional: She is oriented to person, place, and time. She appears well-developed and well-nourished.  HENT:  Head: Normocephalic and atraumatic.  Right Ear: External ear normal.  Left Ear: External ear normal.  Eyes: Conjunctivae are normal.  Neck: Normal range of motion. Neck supple.  Cardiovascular: Normal rate, regular rhythm, normal heart sounds and intact distal pulses.  No murmur heard. Pulmonary/Chest: Effort normal and breath sounds normal. No respiratory distress. She has no wheezes.  Musculoskeletal: Normal range of motion.  Neurological: She is alert and  oriented to person, place, and time.  Skin: Skin is warm and dry.  Psychiatric: She has a normal mood and affect. Her behavior is normal.          Assessment & Plan:  Marland KitchenMarland KitchenLeia was seen today for hospitalization follow-up.  Diagnoses and all orders for this visit:  PVC's (premature ventricular contractions)  B12 deficiency -     B12  Screening for lipid disorders -     Lipid Panel w/reflex Direct  LDL   .Marland Kitchen Depression screen [facility] 2/9 10/25/2017 03/25/2017  Decreased Interest 0 1  Down, Depressed, Hopeless 0 0  PHQ - 2 Score 0 1  Altered sleeping 0 -  Tired, decreased energy 0 -  Change in appetite 0 -  Feeling bad or failure about yourself  0 -  Trouble concentrating 1 -  Moving slowly or fidgety/restless 0 -  Suicidal thoughts 0 -  PHQ-9 Score 1 -  Difficult doing work/chores Not difficult at all -   .. GAD 7 : Generalized Anxiety Score 10/25/2017  Nervous, Anxious, on Edge 1  Control/stop worrying 0  Worry too much - different things 0  Trouble relaxing 0  Restless 0  Easily annoyed or irritable 0  Afraid - awful might happen 0  Total GAD 7 Score 1  Anxiety Difficulty Not difficult at all    Pt is doing much better with PVC's discussed other triggers to avoid them. Discussed medication but I don't recommend at this time. Its seems the liver detox could have induced them. Anxiety does not seem to be a problem.   Will check b12 to determine frequency of injections.   Marland Kitchen.Spent 30 minutes with patient and greater than 50 percent of visit spent counseling patient regarding treatment plan.

## 2017-10-27 ENCOUNTER — Encounter: Payer: Self-pay | Admitting: Physician Assistant

## 2017-10-27 DIAGNOSIS — I493 Ventricular premature depolarization: Secondary | ICD-10-CM | POA: Insufficient documentation

## 2017-10-30 DIAGNOSIS — Z13220 Encounter for screening for lipoid disorders: Secondary | ICD-10-CM | POA: Diagnosis not present

## 2017-10-30 DIAGNOSIS — E538 Deficiency of other specified B group vitamins: Secondary | ICD-10-CM | POA: Diagnosis not present

## 2017-10-31 ENCOUNTER — Other Ambulatory Visit: Payer: Self-pay

## 2017-10-31 LAB — VITAMIN B12: Vitamin B-12: 265 pg/mL (ref 200–1100)

## 2017-10-31 MED ORDER — CYANOCOBALAMIN 1000 MCG/ML IJ SOLN: 1000.0000 ug | INTRAMUSCULAR | 0 refills | Status: DC

## 2017-10-31 NOTE — Progress Notes (Signed)
Ok to send new prescription over for b12( she gives them to herself) for every 3 weeks injections 1017mcg. Recheck lab in 3 months.

## 2017-10-31 NOTE — Progress Notes (Signed)
Call pt: what was the timing of b12 shot and blood draw. Still on the low side but better than before. We might consider increasing frequency.

## 2017-11-18 DIAGNOSIS — R002 Palpitations: Secondary | ICD-10-CM | POA: Diagnosis not present

## 2017-11-18 DIAGNOSIS — R0609 Other forms of dyspnea: Secondary | ICD-10-CM | POA: Diagnosis not present

## 2017-11-20 DIAGNOSIS — I499 Cardiac arrhythmia, unspecified: Secondary | ICD-10-CM | POA: Diagnosis not present

## 2017-11-20 DIAGNOSIS — I498 Other specified cardiac arrhythmias: Secondary | ICD-10-CM | POA: Diagnosis not present

## 2017-11-20 DIAGNOSIS — Z13220 Encounter for screening for lipoid disorders: Secondary | ICD-10-CM | POA: Diagnosis not present

## 2017-11-20 DIAGNOSIS — E538 Deficiency of other specified B group vitamins: Secondary | ICD-10-CM | POA: Diagnosis not present

## 2017-11-20 LAB — LIPID PANEL W/REFLEX DIRECT LDL
Cholesterol: 153 mg/dL (ref <200)
HDL: 42 mg/dL — ABNORMAL LOW (ref >50)
LDL Cholesterol (Calc): 98 mg/dL (calc)
Non-HDL Cholesterol (Calc): 111 mg/dL (calc) (ref <130)
Total CHOL/HDL Ratio: 3.6 (calc) (ref <5.0)
Triglycerides: 43 mg/dL (ref <150)

## 2017-11-22 NOTE — Progress Notes (Signed)
Call pt: cholesterol looks good. HDL could be a little better. More exercise and good fats.

## 2018-02-11 ENCOUNTER — Other Ambulatory Visit: Payer: Self-pay | Admitting: Physician Assistant

## 2018-02-11 DIAGNOSIS — F411 Generalized anxiety disorder: Secondary | ICD-10-CM

## 2018-02-11 MED ORDER — LORAZEPAM 0.5 MG PO TABS: 0.5000 mg | ORAL_TABLET | Freq: Three times a day (TID) | ORAL | 1 refills | Status: DC | PRN

## 2018-02-26 ENCOUNTER — Encounter: Payer: Self-pay | Admitting: Physician Assistant

## 2018-02-26 ENCOUNTER — Ambulatory Visit: Payer: BLUE CROSS/BLUE SHIELD | Admitting: Physician Assistant

## 2018-02-26 VITALS — BP 102/66 | HR 73 | Ht 67.0 in | Wt 190.0 lb

## 2018-02-26 DIAGNOSIS — F411 Generalized anxiety disorder: Secondary | ICD-10-CM | POA: Diagnosis not present

## 2018-02-26 DIAGNOSIS — M7671 Peroneal tendinitis, right leg: Secondary | ICD-10-CM | POA: Diagnosis not present

## 2018-02-26 DIAGNOSIS — M6788 Other specified disorders of synovium and tendon, other site: Secondary | ICD-10-CM

## 2018-02-26 MED ORDER — LORAZEPAM 0.5 MG PO TABS: 0.5000 mg | ORAL_TABLET | Freq: Three times a day (TID) | ORAL | 1 refills | Status: DC | PRN

## 2018-02-26 MED ORDER — DICLOFENAC SODIUM 1 % TD GEL: 4.0000 g | Freq: Four times a day (QID) | TRANSDERMAL | 1 refills | Status: DC

## 2018-02-26 NOTE — Progress Notes (Signed)
Subjective:    Patient ID: Joanna Baker, female    DOB: 1970/07/30, 47 y.o.   MRN: [id number]  HPI  Pt is a 47 yo female with anxiety who presents to the clinic for medication refill. She uses xanax as needed but did not have any refills left. She usually uses 60 tablets over 3 to 4 months. She does not take every day.   She is also concerned with some right lateral foot pain that extends up into calf for the last 5 months. She remembers in April working under her home. She was on her back and using her feet to push up for leverage. After doing that activity her pain began. She has used the occasional aleve but pain has been pretty persistent. She bought food inserts which help some. She has not rested any. At times she just has to walk different to avoid pain. Walking causes pain to be the worst.    .. Active Ambulatory Problems    Diagnosis Date Noted  . Hemorrhoid 08/03/2011  . Right lumbar radiculitis 01/13/2013  . Fatty liver disease, nonalcoholic 81/85/6314  . Depression 12/11/2013  . Anxiety state 12/11/2013  . Premenstrual symptom 11/07/2014  . ACL tear 07/19/2015  . Mass of mandible 09/02/2015  . Right-sided chest pain 05/18/2016  . Radicular syndrome of right leg 05/18/2016  . Pulsatile tinnitus of left ear 05/18/2016  . DUB (dysfunctional uterine bleeding) 05/21/2016  . Pernicious anemia 05/22/2016  . Vitamin D insufficiency 05/22/2016  . SOB (shortness of breath) 12/24/2016  . Palpitations 12/24/2016  . B12 deficiency 12/24/2016  . Right ankle swelling 12/24/2016  . Family history of CHF (congestive heart failure) 12/24/2016  . Breast calcification, left 07/26/2017  . Atypical ductal hyperplasia of left breast 08/05/2017  . PVC's (premature ventricular contractions) 10/27/2017  . Right peroneal tendinosis 02/26/2018   Resolved Ambulatory Problems    Diagnosis Date Noted  . Right leg pain 02/09/2015  . Left knee injury 07/08/2015  . RUQ pain 09/02/2015   Past  Medical History:  Diagnosis Date  . ACL injury tear   . Anxiety   . Fatty liver   . IBS (irritable bowel syndrome)   . TB (tuberculosis), treated        Review of Systems  All other systems reviewed and are negative.      Objective:   Physical Exam  Constitutional: She is oriented to person, place, and time. She appears well-developed and well-nourished.  HENT:  Head: Normocephalic and atraumatic.  Cardiovascular: Normal rate and regular rhythm.  Pulmonary/Chest: Effort normal and breath sounds normal.  Musculoskeletal:  Tenderness right lateral foot posterior to lateral malleous and superior to calcaneal heal that radiates up alongside achilles tendon and into calf.  Achilles reflex in tact.  Able to stand on tip toes.  Good strength and good ROM.  More pain with with dorsiflexion and eversion.  Neurological: She is alert and oriented to person, place, and time.  Psychiatric: She has a normal mood and affect. Her behavior is normal.          Assessment & Plan:  Marland KitchenMarland KitchenDiagnoses and all orders for this visit:  Right peroneal tendinosis -     diclofenac sodium (VOLTAREN) 1 % GEL; Apply 4 g topically 4 (four) times daily. To affected joint.  Anxiety state -     LORazepam (ATIVAN) 0.5 MG tablet; Take 1 tablet (0.5 mg total) by mouth every 8 (eight) hours as needed. for anxiety   Refill for  ativan was sent on 7/31 so not sure why pharmacy did not have it. Ok to use as needed. Pt is not using every day.   Symptoms are presenting like peroneal tendinosis due to the duration. HO given with exercises. Given voltaren gel. NSAIDS as needed. Discussed ace wrap and ankle stabilization brace. No running. Can bike for exercise. Follow up with sports medicine. Discussed a few weeks in cam boot but pt declined due to her work expectations.   Marland Kitchen.Spent 30 minutes with patient and greater than 50 percent of visit spent counseling patient regarding treatment plan.

## 2018-02-26 NOTE — Patient Instructions (Addendum)
Peroneal Tendinopathy Peroneal tendinopathy is irritation of the tendons that pass behind your ankle (peroneal tendons). These tendons attach muscles in your foot to a bone on the side of your foot and underneath the arch of your foot. This condition can cause your peroneal tendons to get bigger and swell. What are the causes? This condition may be caused by:  Putting stress on your ankle over and over again (overuse injury).  A sudden injury that puts stress on your tendons, such as an ankle sprain.  What increases the risk? This condition is more likely to develop in:  People who have high arches.  Athletes who play sports that involve putting stress on the ankle over and over again. These sports include: ? Running. ? Dancing. ? Soccer. ? Basketball.  What are the signs or symptoms? Symptoms of this condition can start suddenly or develop gradually. Symptoms include:  Pain in the back of the ankle, on the side of the foot, or in the arch of the foot.  Pain that gets worse with activity and better with rest.  Swelling.  Warmth.  Weakness in your foot or ankle.  How is this diagnosed? This condition may be diagnosed based on:  Your symptoms.  Your medical history.  A physical exam.  Imaging tests, such as: ? An X-ray or CT scan to check for bone injury. ? MRI or ultrasound to check for muscle or tendon injury.  During your physical exam, your health care provider may move your foot and ankle and test the strength of your leg muscles. How is this treated? This condition may be treated by:  Keeping your body weight off your ankle for several days.  Returning gradually to full activity gradually.  Putting ice on your ankle to reduce swelling.  Taking an anti-inflammatory pain medicine (NSAID).  Having medicine injected into your tendon to reduce swelling.  Wearing a removable boot or brace for ankle support.  Doing range-of-motion exercises and  strengthening exercises (physical therapy) when pain and swelling improve.  If the condition does not improve with treatment, or if a tendon or muscle is damaged, surgery may be needed. Follow these instructions at home: If you have a boot or brace:  Wear it as told by your health care provider. Remove it only as told by your health care provider.  Loosen it if your toes tingle, become numb, or turn cold and blue.  Do not let it get wet if it is not waterproof.  Keep it clean. Managing pain, stiffness, and swelling  If directed, apply ice to the injured area: ? Put ice in a plastic bag. ? Place a towel between your skin and the bag. ? Leave the ice on for 20 minutes, 2-3 times a day.  Take over-the-counter and prescription medicines only as told by your health care provider.  Raise (elevate) your ankle above the level of your heart when resting if you have swelling. Activity  Do not use your ankle to support (bear) your full body weight until your health care provider says that you can.  Do not do activities that make pain or swelling worse.  Return to your normal activities as told by your health care provider. General instructions  Keep all follow-up visits as told by your health care provider. This is important. How is this prevented?  Wear supportive footwear that is appropriate for your athletic activity.  Avoid athletic activities that cause swelling or pain in your ankle or foot.  See your health care provider if you have pain or swelling that does not improve after a few days of rest.  Stop training if you develop pain or swelling.  If you start a new athletic activity, start gradually to build up your strength, endurance, and flexibility. Contact a health care provider if:  Your symptoms get worse.  Your symptoms do not improve in 2-4 weeks.  You develop new, unexplained symptoms. This information is not intended to replace advice given to you by your  health care provider. Make sure you discuss any questions you have with your health care provider. Document Released: 07/02/2005 Document Revised: 03/06/2016 Document Reviewed: 05/21/2015 Elsevier Interactive Patient Education  2018 [place]. Peroneal Tendinopathy Rehab Ask your health care provider which exercises are safe for you. Do exercises exactly as told by your health care provider and adjust them as directed. It is normal to feel mild stretching, pulling, tightness, or discomfort as you do these exercises, but you should stop right away if you feel sudden pain or your pain gets worse.Do not begin these exercises until told by your health care provider. Stretching and range of motion exercises These exercises warm up your muscles and joints and improve the movement and flexibility of your ankle. These exercises also help to relieve pain and stiffness. Exercise A: Gastroc and soleus, standing 1. Stand on the edge of a step on the balls of your feet. The ball of your foot is on the walking surface, right under your toes. 2. Hold onto the railing for balance. 3. Slowly lift your left / right foot, allowing your body weight to press your left / right heel down over the edge of the step. You should feel a stretch in your left / right calf. 4. Hold this position for __________ seconds. Repeat __________ times with your left / right knee straight and __________ times with your left / right knee bent. Complete this stretch __________ times per day. Strengthening exercises These exercises improve the strength and endurance of your foot and ankle. Endurance is the ability to use your muscles for a long time, even after they get tired. Exercise B: Dorsiflexors  1. Secure a rubber exercise band or tube to an object, like a table leg, that will not move if it is pulled on. 2. Secure the other end of the band around your left / right foot. 3. Sit on the floor, facing the object with your left /  right foot extended. The band or tube should be slightly tense when your foot is relaxed. 4. Slowly flex your left / right ankle and toes to bring your foot toward you. 5. Hold this position for __________ seconds. 6. Slowly return your foot to the starting position. Repeat __________ times. Complete this exercise __________ times per day. Exercise C: Evertors 1. Sit on the floor with your legs straight out in front of you. 2. Loop a rubber exercise or band or tube around the ball of your left / right foot. The ball of your foot is on the walking surface, right under your toes. 3. Hold the ends of the band in your hands, or secure the band to a stable object. 4. Slowly push your foot outward, away from your other leg. 5. Hold this position for __________ seconds. 6. Slowly return your foot to the starting position. Repeat __________ times. Complete this exercise __________ times per day. Exercise D: Standing heel raise ( plantar flexion) 1. Stand with your feet shoulder-width apart  with the balls of your feet on a step. The ball of your foot is on the walking surface, right under your toes. 2. Keep your weight spread evenly over the width of your feet while you rise up on your toes. Use a wall or railing to steady yourself, but try not to use it for support. 3. If this exercise is too easy, try these options: ? Shift your weight toward your left / right leg until you feel challenged. ? If told by your health care provider, stand on your left / right leg only. 4. Hold this position for __________ seconds. Repeat __________ times. Complete this exercise __________ times per day. Exercise E: Single leg stand 1. Without shoes, stand near a railing or in a doorway. You may hold onto the railing or door frame as needed. 2. Stand on your left / right foot. Keep your big toe down on the floor and try to keep your arch lifted. ? Do not roll to the outside of your foot. ? If this exercise is too  easy, you can try it with your eyes closed or while standing on a pillow. 3. Hold this position for __________ seconds. Repeat __________ times. Complete this exercise __________ times per day. This information is not intended to replace advice given to you by your health care provider. Make sure you discuss any questions you have with your health care provider. Document Released: 07/02/2005 Document Revised: 03/08/2016 Document Reviewed: 05/21/2015 Elsevier Interactive Patient Education  [employer].

## 2018-02-27 ENCOUNTER — Encounter: Payer: Self-pay | Admitting: Physician Assistant

## 2018-05-13 ENCOUNTER — Ambulatory Visit (INDEPENDENT_AMBULATORY_CARE_PROVIDER_SITE_OTHER): Payer: BLUE CROSS/BLUE SHIELD

## 2018-05-13 ENCOUNTER — Ambulatory Visit (INDEPENDENT_AMBULATORY_CARE_PROVIDER_SITE_OTHER): Payer: BLUE CROSS/BLUE SHIELD | Admitting: Physician Assistant

## 2018-05-13 ENCOUNTER — Encounter: Payer: Self-pay | Admitting: Physician Assistant

## 2018-05-13 VITALS — BP 84/53 | HR 83 | Temp 98.9°F | Ht 67.0 in | Wt 195.0 lb

## 2018-05-13 DIAGNOSIS — L309 Dermatitis, unspecified: Secondary | ICD-10-CM | POA: Diagnosis not present

## 2018-05-13 DIAGNOSIS — R591 Generalized enlarged lymph nodes: Secondary | ICD-10-CM

## 2018-05-13 DIAGNOSIS — M79671 Pain in right foot: Secondary | ICD-10-CM | POA: Diagnosis not present

## 2018-05-13 DIAGNOSIS — M2550 Pain in unspecified joint: Secondary | ICD-10-CM | POA: Diagnosis not present

## 2018-05-13 DIAGNOSIS — Z131 Encounter for screening for diabetes mellitus: Secondary | ICD-10-CM | POA: Diagnosis not present

## 2018-05-13 DIAGNOSIS — M7731 Calcaneal spur, right foot: Secondary | ICD-10-CM

## 2018-05-13 DIAGNOSIS — M79644 Pain in right finger(s): Secondary | ICD-10-CM

## 2018-05-13 MED ORDER — TRIAMCINOLONE ACETONIDE 0.5 % EX CREA: 1.0000 "application " | TOPICAL_CREAM | Freq: Two times a day (BID) | CUTANEOUS | 1 refills | Status: DC

## 2018-05-13 NOTE — Progress Notes (Signed)
Call pt: calcaneal spurs noted in heel. I do think sports medicine could help with infections.

## 2018-05-13 NOTE — Progress Notes (Signed)
Subjective:    Patient ID: Joanna Baker, female    DOB: Nov 27, 1970, 47 y.o.   MRN: [id number]  HPI  Pt is a 47 yo female with PVC's, IBS who presents to the clinic with multiple concerns today.   She has a scaly patch on right hand around middle finger that is itchy. She wonders what to do about it. She has not tried anything. She admits to washing her hands a lot.   She has a lot of pain in her right thumb. She feels a nodule on the joint too. Some days worse than others. She wonders about RA vs OA. There is RA on her mothers side.   She also has some right heel pain since April. At times the pain radiates up into her right calf and feels "jumpy". Worse first thing in morning and better as day goes on.   She admits to feel "under the weather" about 1 week ago. She noticed 2 lymphnodes that were swollen over posterior neck. Her "spine felt congested". She took aleve cold and sinus. She feels better. lymphnodes have resolved.   .. Active Ambulatory Problems    Diagnosis Date Noted  . Hemorrhoid 08/03/2011  . Right lumbar radiculitis 01/13/2013  . Fatty liver disease, nonalcoholic 50/93/2671  . Depression 12/11/2013  . Anxiety state 12/11/2013  . Premenstrual symptom 11/07/2014  . ACL tear 07/19/2015  . Mass of mandible 09/02/2015  . Right-sided chest pain 05/18/2016  . Radicular syndrome of right leg 05/18/2016  . Pulsatile tinnitus of left ear 05/18/2016  . DUB (dysfunctional uterine bleeding) 05/21/2016  . Pernicious anemia 05/22/2016  . Vitamin D insufficiency 05/22/2016  . SOB (shortness of breath) 12/24/2016  . Palpitations 12/24/2016  . B12 deficiency 12/24/2016  . Right ankle swelling 12/24/2016  . Family history of CHF (congestive heart failure) 12/24/2016  . Breast calcification, left 07/26/2017  . Atypical ductal hyperplasia of left breast 08/05/2017  . PVC's (premature ventricular contractions) 10/27/2017  . Right peroneal tendinosis 02/26/2018  . Hand eczema  05/16/2018  . Pain of right thumb 05/16/2018  . Pain of right heel 05/16/2018  . Multiple joint pain 05/16/2018  . Lymphadenopathy 05/16/2018   Resolved Ambulatory Problems    Diagnosis Date Noted  . Right leg pain 02/09/2015  . Left knee injury 07/08/2015  . RUQ pain 09/02/2015   Past Medical History:  Diagnosis Date  . ACL injury tear   . Anxiety   . Fatty liver   . IBS (irritable bowel syndrome)   . TB (tuberculosis), treated       Review of Systems  All other systems reviewed and are negative.      Objective:   Physical Exam  Constitutional: She is oriented to person, place, and time. She appears well-developed and well-nourished.  HENT:  Head: Normocephalic and atraumatic.  Neck: Normal range of motion. Neck supple.  Cardiovascular: Normal rate and regular rhythm.  Pulmonary/Chest: Effort normal and breath sounds normal.  Musculoskeletal:  Right thumb:  Tenderness over IP joint with palpable nodule. No joint swelling or warmth.   Right heel: Pain to palpation over heel. No erythema or warmth. Pain with plantar flexion.  Pain to palpation around lateral posterior malleolus. No swelling.   Neurological: She is alert and oriented to person, place, and time.  Skin:  Right dorsal middle finger 2cm by 2cm erythematous scaly patch.   Psychiatric: She has a normal mood and affect. Her behavior is normal.  Assessment & Plan:  Marland KitchenMarland KitchenDiagnoses and all orders for this visit:  Pain of right thumb -     DG Finger Thumb Right  Hand eczema -     triamcinolone cream (KENALOG) 0.5 %; Apply 1 application topically 2 (two) times daily. To affected areas.  Pain of right heel -     DG Foot Complete Right  Multiple joint pain -     CBC with Differential/Platelet -     Rheumatoid factor -     Cyclic citrul peptide antibody, IgG -     Antinuclear Antib (ANA)  Lymphadenopathy -     CBC with Differential/Platelet -     TSH  Screening for diabetes mellitus -      COMPLETE METABOLIC PANEL WITH GFR   Treat eczema with topical steroid. After 2 weeks let me know if no benefit. Use as needed. Discussed good moisturizer with cetaphil/aveeno.   Labs to evaluate for RA. Xray to look at joints. Appears like OA to be. Discussed treatment options with diclofenac gel, oral NSAIDs and injections with sports medicine.   Right foot and heel pain. I still think could be some peroneal tendonitis and plantar fascitis. Use diclofenac, NSAIDS, good supportive shoe. Follow up with sports medicine.   CBC for lymphadenopathy. Reassured when away today. If lymph node stays for more than 4 weeks can always follow up.

## 2018-05-13 NOTE — Progress Notes (Signed)
No signs of RA. No significant arthritis.

## 2018-05-14 NOTE — Progress Notes (Signed)
Call pt: labs look great. Thyroid normal. Rheumatoid labs negative.

## 2018-05-16 ENCOUNTER — Encounter: Payer: Self-pay | Admitting: Physician Assistant

## 2018-05-16 DIAGNOSIS — R591 Generalized enlarged lymph nodes: Secondary | ICD-10-CM | POA: Insufficient documentation

## 2018-05-16 DIAGNOSIS — M79644 Pain in right finger(s): Secondary | ICD-10-CM | POA: Insufficient documentation

## 2018-05-16 DIAGNOSIS — L309 Dermatitis, unspecified: Secondary | ICD-10-CM | POA: Insufficient documentation

## 2018-05-16 DIAGNOSIS — M2550 Pain in unspecified joint: Secondary | ICD-10-CM | POA: Insufficient documentation

## 2018-05-16 DIAGNOSIS — M79671 Pain in right foot: Secondary | ICD-10-CM | POA: Insufficient documentation

## 2018-05-16 LAB — CBC WITH DIFFERENTIAL/PLATELET
BASOS ABS: 30 {cells}/uL (ref 0–200)
Basophils Relative: 0.6 %
EOS PCT: 1 %
Eosinophils Absolute: 50 cells/uL (ref 15–500)
HCT: 41.8 % (ref 35.0–45.0)
Hemoglobin: 13.6 g/dL (ref 11.7–15.5)
Lymphs Abs: 1445 cells/uL (ref 850–3900)
MCH: 30 pg (ref 27.0–33.0)
MCHC: 32.5 g/dL (ref 32.0–36.0)
MCV: 92.1 fL (ref 80.0–100.0)
MONOS PCT: 9.6 %
MPV: 11.1 fL (ref 7.5–12.5)
NEUTROS PCT: 59.9 %
Neutro Abs: 2995 cells/uL (ref 1500–7800)
Platelets: 228 10*3/uL (ref 140–400)
RBC: 4.54 10*6/uL (ref 3.80–5.10)
RDW: 12 % (ref 11.0–15.0)
TOTAL LYMPHOCYTE: 28.9 %
WBC mixed population: 480 cells/uL (ref 200–950)
WBC: 5 10*3/uL (ref 3.8–10.8)

## 2018-05-16 LAB — COMPLETE METABOLIC PANEL WITH GFR
AG RATIO: 1.6 (calc) (ref 1.0–2.5)
ALKALINE PHOSPHATASE (APISO): 35 U/L (ref 33–115)
ALT: 17 U/L (ref 6–29)
AST: 20 U/L (ref 10–35)
Albumin: 4.1 g/dL (ref 3.6–5.1)
BUN: 10 mg/dL (ref 7–25)
CALCIUM: 9.1 mg/dL (ref 8.6–10.2)
CO2: 30 mmol/L (ref 20–32)
Chloride: 106 mmol/L (ref 98–110)
Creat: 0.68 mg/dL (ref 0.50–1.10)
GFR, EST NON AFRICAN AMERICAN: 106 mL/min/{1.73_m2} (ref >=60)
GFR, Est African American: 122 mL/min/{1.73_m2} (ref >=60)
GLOBULIN: 2.5 g/dL (ref 1.9–3.7)
Glucose, Bld: 102 mg/dL (ref 65–139)
POTASSIUM: 4.1 mmol/L (ref 3.5–5.3)
SODIUM: 142 mmol/L (ref 135–146)
Total Bilirubin: 0.7 mg/dL (ref 0.2–1.2)
Total Protein: 6.6 g/dL (ref 6.1–8.1)

## 2018-05-16 LAB — CYCLIC CITRUL PEPTIDE ANTIBODY, IGG: Cyclic Citrullin Peptide Ab: <16 UNITS

## 2018-05-16 LAB — TSH: TSH: 1.78 m[IU]/L

## 2018-05-16 LAB — ANA: Anti Nuclear Antibody(ANA): NEGATIVE

## 2018-05-16 LAB — RHEUMATOID FACTOR: Rhuematoid fact SerPl-aCnc: <14 IU/mL (ref <14)

## 2018-05-18 NOTE — Progress Notes (Signed)
Call pt: ANA was also negative. No signs of autoimmune disease.

## 2018-08-12 ENCOUNTER — Ambulatory Visit: Payer: BLUE CROSS/BLUE SHIELD | Admitting: Physician Assistant

## 2018-08-27 DIAGNOSIS — M65872 Other synovitis and tenosynovitis, left ankle and foot: Secondary | ICD-10-CM | POA: Diagnosis not present

## 2018-08-27 DIAGNOSIS — M65871 Other synovitis and tenosynovitis, right ankle and foot: Secondary | ICD-10-CM | POA: Diagnosis not present

## 2018-08-27 DIAGNOSIS — M722 Plantar fascial fibromatosis: Secondary | ICD-10-CM | POA: Diagnosis not present

## 2018-09-03 ENCOUNTER — Other Ambulatory Visit: Payer: Self-pay | Admitting: Physician Assistant

## 2018-09-03 DIAGNOSIS — M79672 Pain in left foot: Secondary | ICD-10-CM | POA: Diagnosis not present

## 2018-09-03 DIAGNOSIS — M79671 Pain in right foot: Secondary | ICD-10-CM | POA: Diagnosis not present

## 2018-09-03 DIAGNOSIS — M545 Low back pain: Secondary | ICD-10-CM | POA: Diagnosis not present

## 2018-09-08 DIAGNOSIS — M79671 Pain in right foot: Secondary | ICD-10-CM | POA: Diagnosis not present

## 2018-09-08 DIAGNOSIS — M79672 Pain in left foot: Secondary | ICD-10-CM | POA: Diagnosis not present

## 2018-09-08 DIAGNOSIS — M545 Low back pain: Secondary | ICD-10-CM | POA: Diagnosis not present

## 2018-09-12 DIAGNOSIS — Z01419 Encounter for gynecological examination (general) (routine) without abnormal findings: Secondary | ICD-10-CM | POA: Diagnosis not present

## 2018-09-12 DIAGNOSIS — Z6831 Body mass index (BMI) 31.0-31.9, adult: Secondary | ICD-10-CM | POA: Diagnosis not present

## 2018-09-12 DIAGNOSIS — Z1151 Encounter for screening for human papillomavirus (HPV): Secondary | ICD-10-CM | POA: Diagnosis not present

## 2018-09-12 DIAGNOSIS — N951 Menopausal and female climacteric states: Secondary | ICD-10-CM | POA: Diagnosis not present

## 2018-09-12 LAB — HM PAP SMEAR: HM Pap smear: NEGATIVE

## 2018-09-15 ENCOUNTER — Encounter: Payer: Self-pay | Admitting: Physician Assistant

## 2018-09-15 DIAGNOSIS — F411 Generalized anxiety disorder: Secondary | ICD-10-CM

## 2018-09-16 MED ORDER — LORAZEPAM 0.5 MG PO TABS: 0.5000 mg | ORAL_TABLET | Freq: Three times a day (TID) | ORAL | 3 refills | Status: DC | PRN

## 2018-09-30 ENCOUNTER — Encounter: Payer: Self-pay | Admitting: Physician Assistant

## 2018-10-01 ENCOUNTER — Encounter: Payer: Self-pay | Admitting: Physician Assistant

## 2018-10-01 ENCOUNTER — Other Ambulatory Visit: Payer: Self-pay

## 2018-10-01 ENCOUNTER — Ambulatory Visit (INDEPENDENT_AMBULATORY_CARE_PROVIDER_SITE_OTHER): Payer: BLUE CROSS/BLUE SHIELD | Admitting: Physician Assistant

## 2018-10-01 VITALS — BP 103/58 | HR 70 | Temp 98.2°F | Wt 199.0 lb

## 2018-10-01 DIAGNOSIS — J329 Chronic sinusitis, unspecified: Secondary | ICD-10-CM

## 2018-10-01 DIAGNOSIS — Z9109 Other allergy status, other than to drugs and biological substances: Secondary | ICD-10-CM

## 2018-10-01 DIAGNOSIS — R05 Cough: Secondary | ICD-10-CM | POA: Diagnosis not present

## 2018-10-01 DIAGNOSIS — J40 Bronchitis, not specified as acute or chronic: Secondary | ICD-10-CM | POA: Diagnosis not present

## 2018-10-01 DIAGNOSIS — J302 Other seasonal allergic rhinitis: Secondary | ICD-10-CM

## 2018-10-01 DIAGNOSIS — R059 Cough, unspecified: Secondary | ICD-10-CM

## 2018-10-01 LAB — POCT INFLUENZA A/B
INFLUENZA A, POC: NEGATIVE
INFLUENZA B, POC: NEGATIVE

## 2018-10-01 LAB — POCT RAPID STREP A (OFFICE): RAPID STREP A SCREEN: NEGATIVE

## 2018-10-01 MED ORDER — METHYLPREDNISOLONE SODIUM SUCC 125 MG IJ SOLR
125.0000 mg | Freq: Once | INTRAMUSCULAR | Status: AC
  Administered 2018-10-01: 125 mg via INTRAMUSCULAR

## 2018-10-01 MED ORDER — AZITHROMYCIN 250 MG PO TABS: ORAL_TABLET | ORAL | 0 refills | Status: DC

## 2018-10-01 MED ORDER — ALBUTEROL SULFATE HFA 108 (90 BASE) MCG/ACT IN AERS: 2.0000 | INHALATION_SPRAY | Freq: Four times a day (QID) | RESPIRATORY_TRACT | 0 refills | Status: DC | PRN

## 2018-10-01 NOTE — Patient Instructions (Signed)

## 2018-10-01 NOTE — Telephone Encounter (Signed)
Can we get her last [facility]ogram into out EMR?

## 2018-10-01 NOTE — Telephone Encounter (Signed)
I tried to pull the last one and it say no summary available. I sent a message for patient to sign a release from to get the results scanned into the chart.

## 2018-10-01 NOTE — Progress Notes (Signed)
Subjective:    Patient ID: Joanna Baker, female    DOB: 1970-11-10, 48 y.o.   MRN: [id number]  HPI  Pt is a 48 yo female with seasonal allergies and cat allergies who has had a dry cough for 2 months. She reports waking up and coughing and sneezing and then feeling better when in car and at work. She has no hx of asthma but at times does feel tight in chest when her allergies flare. She is on daily claritin and taking OTC mucinex.  She is concerned because over last 2 days feeling worse. She has more sinus pressure, ear popping, headache, flushed feeling, body aches. She did have a co-worker test positive for flu. She is here to see if anything else is going on. She has never had a temperature or SOB. No recent travel.   .. Active Ambulatory Problems    Diagnosis Date Noted  . Hemorrhoid 08/03/2011  . Right lumbar radiculitis 01/13/2013  . Fatty liver disease, nonalcoholic 67/20/9470  . Depression 12/11/2013  . Anxiety state 12/11/2013  . Premenstrual symptom 11/07/2014  . ACL tear 07/19/2015  . Mass of mandible 09/02/2015  . Right-sided chest pain 05/18/2016  . Radicular syndrome of right leg 05/18/2016  . Pulsatile tinnitus of left ear 05/18/2016  . DUB (dysfunctional uterine bleeding) 05/21/2016  . Pernicious anemia 05/22/2016  . Vitamin D insufficiency 05/22/2016  . SOB (shortness of breath) 12/24/2016  . Palpitations 12/24/2016  . B12 deficiency 12/24/2016  . Right ankle swelling 12/24/2016  . Family history of CHF (congestive heart failure) 12/24/2016  . Breast calcification, left 07/26/2017  . Atypical ductal hyperplasia of left breast 08/05/2017  . PVC's (premature ventricular contractions) 10/27/2017  . Right peroneal tendinosis 02/26/2018  . Hand eczema 05/16/2018  . Pain of right thumb 05/16/2018  . Pain of right heel 05/16/2018  . Multiple joint pain 05/16/2018  . Lymphadenopathy 05/16/2018   Resolved Ambulatory Problems    Diagnosis Date Noted  . Right leg  pain 02/09/2015  . Left knee injury 07/08/2015  . RUQ pain 09/02/2015   Past Medical History:  Diagnosis Date  . ACL injury tear   . Anxiety   . Fatty liver   . IBS (irritable bowel syndrome)   . TB (tuberculosis), treated        Review of Systems    see HPI.  Objective:   Physical Exam Vitals signs reviewed.  Constitutional:      Appearance: Normal appearance.  HENT:     Head: Normocephalic.     Comments: Maxillary sinus tenderness to palpation.     Right Ear: Tympanic membrane normal.     Left Ear: Tympanic membrane normal.     Nose: Congestion present.     Mouth/Throat:     Pharynx: Posterior oropharyngeal erythema present.  Eyes:     Conjunctiva/sclera: Conjunctivae normal.  Cardiovascular:     Rate and Rhythm: Normal rate and regular rhythm.     Pulses: Normal pulses.  Pulmonary:     Effort: Pulmonary effort is normal.     Breath sounds: Normal breath sounds. No wheezing or rhonchi.  Neurological:     General: No focal deficit present.     Mental Status: She is alert and oriented to person, place, and time.  Psychiatric:        Mood and Affect: Mood normal.        Behavior: Behavior normal.           Assessment & Plan:  Marland KitchenMarland Kitchen  Joanna Baker was seen today for cough.  Diagnoses and all orders for this visit:  Sinobronchitis -     azithromycin (ZITHROMAX) 250 MG tablet; Take 2 tablets now and then one tablet for 4 days.  Seasonal allergies -     albuterol (PROVENTIL HFA;VENTOLIN HFA) 108 (90 Base) MCG/ACT inhaler; Inhale 2 puffs into the lungs every 6 (six) hours as needed for wheezing or shortness of breath.  Environmental allergies -     albuterol (PROVENTIL HFA;VENTOLIN HFA) 108 (90 Base) MCG/ACT inhaler; Inhale 2 puffs into the lungs every 6 (six) hours as needed for wheezing or shortness of breath.  Cough -     albuterol (PROVENTIL HFA;VENTOLIN HFA) 108 (90 Base) MCG/ACT inhaler; Inhale 2 puffs into the lungs every 6 (six) hours as needed for wheezing or  shortness of breath. -     POCT rapid strep A -     POCT Influenza A/B -     methylPREDNISolone sodium succinate (SOLU-MEDROL) 125 mg/2 mL injection 125 mg   .. Results for orders placed or performed in visit on 10/01/18  POCT rapid strep A  Result Value Ref Range   Rapid Strep A Screen Negative Negative  POCT Influenza A/B  Result Value Ref Range   Influenza A, POC Negative Negative   Influenza B, POC Negative Negative     Negative flu and strep.  Likely ongoing allergies with sinusitis and bronchitis. Treated with zpak, solumedrol 125mg  IM(pt does not tolerate oral), albuterol. Discussed other symptomatic care.  Follow up as needed.   Noticed before closing chart need for mammogram and abnormal mammogram on 07/2017 that suggested biopsy. Sent mychart msg to see what follow up had been done. Would like to make sure she follows up with this.

## 2018-10-22 ENCOUNTER — Other Ambulatory Visit: Payer: Self-pay | Admitting: Physician Assistant

## 2018-10-22 DIAGNOSIS — J302 Other seasonal allergic rhinitis: Secondary | ICD-10-CM

## 2018-10-22 DIAGNOSIS — R059 Cough, unspecified: Secondary | ICD-10-CM

## 2018-10-22 DIAGNOSIS — Z9109 Other allergy status, other than to drugs and biological substances: Secondary | ICD-10-CM

## 2018-10-22 DIAGNOSIS — R05 Cough: Secondary | ICD-10-CM

## 2018-10-23 NOTE — Telephone Encounter (Signed)
Received refill request for Proair inhaler from [facility]. Patient seen one month ago for symptoms of Sinobronchitis. I called to check on her after receiving request for refill. She states symptoms are the same. She wakes up every morning with cough. Uses inhaler in the mornings. Symptoms are stable the rest of the day. She wanted the additional inhaler on hand if needed. RX refilled. She will call back if symptoms change.  [place].

## 2018-11-10 DIAGNOSIS — D225 Melanocytic nevi of trunk: Secondary | ICD-10-CM | POA: Diagnosis not present

## 2018-11-10 DIAGNOSIS — L814 Other melanin hyperpigmentation: Secondary | ICD-10-CM | POA: Diagnosis not present

## 2018-11-10 DIAGNOSIS — L821 Other seborrheic keratosis: Secondary | ICD-10-CM | POA: Diagnosis not present

## 2018-11-10 DIAGNOSIS — D1801 Hemangioma of skin and subcutaneous tissue: Secondary | ICD-10-CM | POA: Diagnosis not present

## 2018-12-01 ENCOUNTER — Encounter: Payer: BLUE CROSS/BLUE SHIELD | Admitting: Physician Assistant

## 2018-12-10 ENCOUNTER — Encounter: Payer: Self-pay | Admitting: Physician Assistant

## 2018-12-10 ENCOUNTER — Ambulatory Visit (INDEPENDENT_AMBULATORY_CARE_PROVIDER_SITE_OTHER): Payer: BLUE CROSS/BLUE SHIELD | Admitting: Physician Assistant

## 2018-12-10 VITALS — BP 120/59 | HR 99 | Temp 98.9°F | Ht 67.0 in | Wt 201.0 lb

## 2018-12-10 DIAGNOSIS — Z0000 Encounter for general adult medical examination without abnormal findings: Secondary | ICD-10-CM

## 2018-12-10 DIAGNOSIS — E538 Deficiency of other specified B group vitamins: Secondary | ICD-10-CM

## 2018-12-10 DIAGNOSIS — M898X9 Other specified disorders of bone, unspecified site: Secondary | ICD-10-CM | POA: Diagnosis not present

## 2018-12-10 DIAGNOSIS — H8112 Benign paroxysmal vertigo, left ear: Secondary | ICD-10-CM

## 2018-12-10 DIAGNOSIS — M25471 Effusion, right ankle: Secondary | ICD-10-CM

## 2018-12-10 DIAGNOSIS — E559 Vitamin D deficiency, unspecified: Secondary | ICD-10-CM

## 2018-12-10 DIAGNOSIS — Z23 Encounter for immunization: Secondary | ICD-10-CM

## 2018-12-10 DIAGNOSIS — S61210A Laceration without foreign body of right index finger without damage to nail, initial encounter: Secondary | ICD-10-CM

## 2018-12-10 DIAGNOSIS — D519 Vitamin B12 deficiency anemia, unspecified: Secondary | ICD-10-CM | POA: Diagnosis not present

## 2018-12-10 DIAGNOSIS — R7989 Other specified abnormal findings of blood chemistry: Secondary | ICD-10-CM | POA: Diagnosis not present

## 2018-12-10 NOTE — Progress Notes (Signed)
Subjective:     Joanna Baker is a 48 y.o. female and is here for a comprehensive physical exam. The patient reports problems - see below.    Pt was fishing yesterday and her finger got stuck in hook. She is concerned about her tetanus shot.   Overall her anxiety has been ok. She continues to have "anger outburst" with a Art therapist and her mother. She seems to get over it really fast.   She does have some intermittent swelling of right ankle after her injury over a year ago.   She does complain of some dizziness with position changes of head. She notice when she turns her head to the left she gets dizzy.   She does just ache and have pain in her bones for no reason. She can have pain in thighs, arms, hands for no reason and then it just go away.     Social History   Socioeconomic History  . Marital status: Single    Spouse name: Not on file  . Number of children: Not on file  . Years of education: Not on file  . Highest education level: Not on file  Occupational History  . Not on file  Social Needs  . Financial resource strain: Not on file  . Food insecurity:    Worry: Not on file    Inability: Not on file  . Transportation needs:    Medical: Not on file    Non-medical: Not on file  Tobacco Use  . Smoking status: Former Smoker    Packs/day: 0.25    Years: 5.00    Pack years: 1.25    Types: Cigarettes  . Smokeless tobacco: Never Used  Substance and Sexual Activity  . Alcohol use: Yes    Alcohol/week: 2.0 standard drinks    Types: 2 Cans of beer per week  . Drug use: Yes    Types: Marijuana    Comment: history of use, not current use  . Sexual activity: Yes    Partners: Female  Lifestyle  . Physical activity:    Days per week: Not on file    Minutes per session: Not on file  . Stress: Not on file  Relationships  . Social connections:    Talks on phone: Not on file    Gets together: Not on file    Attends religious service: Not on file    Active member of club  or organization: Not on file    Attends meetings of clubs or organizations: Not on file    Relationship status: Not on file  . Intimate partner violence:    Fear of current or ex partner: Not on file    Emotionally abused: Not on file    Physically abused: Not on file    Forced sexual activity: Not on file  Other Topics Concern  . Not on file  Social History Narrative  . Not on file   Health Maintenance  Topic Date Due  . PAP SMEAR-Modifier  01/19/2019  . INFLUENZA VACCINE  02/14/2019  . MAMMOGRAM  10/14/2019  . TETANUS/TDAP  12/09/2028  . HIV Screening  Completed    The following portions of the patient's history were reviewed and updated as appropriate: allergies, current medications, past family history, past medical history, past social history, past surgical history and problem list.  Review of Systems Pertinent items noted in HPI and remainder of comprehensive ROS otherwise negative.   Objective:    BP (!) 120/59  Pulse 99   Temp 98.9 F (37.2 C) (Oral)   Ht 5\' 7"  (1.702 m)   Wt 201 lb (91.2 kg)   SpO2 96%   BMI 31.48 kg/m  General appearance: alert, cooperative and appears stated age Head: Normocephalic, without obvious abnormality, atraumatic Eyes: conjunctivae/corneas clear. PERRL, EOM's intact. Fundi benign. Ears: normal TM's and external ear canals both ears Nose: Nares normal. Septum midline. Mucosa normal. No drainage or sinus tenderness. Throat: lips, mucosa, and tongue normal; teeth and gums normal Neck: no adenopathy, no carotid bruit, no JVD, supple, symmetrical, trachea midline and thyroid not enlarged, symmetric, no tenderness/mass/nodules Back: symmetric, no curvature. ROM normal. No CVA tenderness. Lungs: clear to auscultation bilaterally Heart: regular rate and rhythm, S1, S2 normal, no murmur, click, rub or gallop Abdomen: soft, non-tender; bowel sounds normal; no masses,  no organomegaly Extremities: extremities normal, atraumatic, no cyanosis  or edema Pulses: 2+ and symmetric Skin: Skin color, texture, turgor normal. No rashes or lesions right index finger tip 1cm laceration. Some erythema. No discharge.  Lymph nodes: Cervical, supraclavicular, and axillary nodes normal. Neurologic: Mental status: Alert, oriented, thought content appropriate Dix hallpike to the left positive.    Assessment:    Healthy female exam.      Plan:    Marland KitchenMarland KitchenHelyne was seen today for annual exam.  Diagnoses and all orders for this visit:  Routine physical examination -     Lipid Panel w/reflex Direct LDL -     COMPLETE METABOLIC PANEL WITH GFR -     VITAMIN D 25 Hydroxy (Vit-D Deficiency, Fractures) -     B12 and Folate Panel -     B. burgdorfi antibodies -     CBC with Differential/Platelet -     Ferritin  Bone pain -     VITAMIN D 25 Hydroxy (Vit-D Deficiency, Fractures) -     B. burgdorfi antibodies -     CBC with Differential/Platelet -     Ferritin  Vitamin D insufficiency -     VITAMIN D 25 Hydroxy (Vit-D Deficiency, Fractures)  Vitamin B12 deficiency -     B12 and Folate Panel  BPPV (benign paroxysmal positional vertigo), left  Laceration of right index finger, foreign body presence unspecified, nail damage status unspecified, initial encounter  Need for tetanus booster -     Tdap vaccine greater than or equal to 7yo IM  Right ankle swelling  .Marland Kitchen Depression screen [facility] 2/9 12/10/2018 05/13/2018 10/25/2017 03/25/2017  Decreased Interest 0 0 0 1  Down, Depressed, Hopeless 0 0 0 0  PHQ - 2 Score 0 0 0 1  Altered sleeping 0 0 0 -  Tired, decreased energy 0 0 0 -  Change in appetite 0 1 0 -  Feeling bad or failure about yourself  0 0 0 -  Trouble concentrating 0 0 1 -  Moving slowly or fidgety/restless 0 0 0 -  Suicidal thoughts 0 0 0 -  PHQ-9 Score 0 1 1 -  Difficult doing work/chores Not difficult at all Not difficult at all Not difficult at all -   .. GAD 7 : Generalized Anxiety Score 12/10/2018 05/13/2018 10/25/2017   Nervous, Anxious, on Edge 0 1 1  Control/stop worrying 0 0 0  Worry too much - different things 0 0 0  Trouble relaxing 0 0 0  Restless 0 0 0  Easily annoyed or irritable 2 0 0  Afraid - awful might happen 0 0 0  Total GAD 7 Score  2 1 1   Anxiety Difficulty Somewhat difficult Not difficult at all Not difficult at all    .Marland Kitchen Discussed 150 minutes of exercise a week.  Encouraged vitamin D 1000 units and Calcium 1300mg  or 4 servings of dairy a day.  Fasting labs ordered.  Mammogram up to date.  Pap up to date.  Tdap given today due to recent injury. Wound appears clean and healing well. Consider epson salt soaks and follow up with signs of infection.   Reassured intermittent swelling after injury. Rest, ice, elevate and compress when that occurs.   Discussed mood and ways to reduce stress and anger outburst. Declines counseling. Declines medication.   dixhallpike positive to the left. Given epley manuevers 3 times a day for 3 reps. Follow up as needed. Add flonase.   See After Visit Summary for Counseling Recommendations

## 2018-12-10 NOTE — Patient Instructions (Addendum)
Health Maintenance, Female Adopting a healthy lifestyle and getting preventive care can go a long way to promote health and wellness. Talk with your health care provider about what schedule of regular examinations is right for you. This is a good chance for you to check in with your provider about disease prevention and staying healthy. In between checkups, there are plenty of things you can do on your own. Experts have done a lot of research about which lifestyle changes and preventive measures are most likely to keep you healthy. Ask your health care provider for more information. Weight and diet Eat a healthy diet  Be sure to include plenty of vegetables, fruits, low-fat dairy products, and lean protein.  Do not eat a lot of foods high in solid fats, added sugars, or salt.  Get regular exercise. This is one of the most important things you can do for your health. ? Most adults should exercise for at least 150 minutes each week. The exercise should increase your heart rate and make you sweat (moderate-intensity exercise). ? Most adults should also do strengthening exercises at least twice a week. This is in addition to the moderate-intensity exercise. Maintain a healthy weight  Body mass index (BMI) is a measurement that can be used to identify possible weight problems. It estimates body fat based on height and weight. Your health care provider can help determine your BMI and help you achieve or maintain a healthy weight.  For females 20 years of age and older: ? A BMI below 18.5 is considered underweight. ? A BMI of 18.5 to 24.9 is normal. ? A BMI of 25 to 29.9 is considered overweight. ? A BMI of 30 and above is considered obese. Watch levels of cholesterol and blood lipids  You should start having your blood tested for lipids and cholesterol at 46 years of age, then have this test every 5 years.  You may need to have your cholesterol levels checked more often if: ? Your lipid or  cholesterol levels are high. ? You are older than 46 years of age. ? You are at high risk for heart disease. Cancer screening Lung Cancer  Lung cancer screening is recommended for adults 55-80 years old who are at high risk for lung cancer because of a history of smoking.  A yearly low-dose CT scan of the lungs is recommended for people who: ? Currently smoke. ? Have quit within the past 15 years. ? Have at least a 30-pack-year history of smoking. A pack year is smoking an average of one pack of cigarettes a day for 1 year.  Yearly screening should continue until it has been 15 years since you quit.  Yearly screening should stop if you develop a health problem that would prevent you from having lung cancer treatment. Breast Cancer  Practice breast self-awareness. This means understanding how your breasts normally appear and feel.  It also means doing regular breast self-exams. Let your health care provider know about any changes, no matter how small.  If you are in your 20s or 30s, you should have a clinical breast exam (CBE) by a health care provider every 1-3 years as part of a regular health exam.  If you are 40 or older, have a CBE every year. Also consider having a breast X-ray (mammogram) every year.  If you have a family history of breast cancer, talk to your health care provider about genetic screening.  If you are at high risk for breast cancer, talk   to your health care provider about having an MRI and a mammogram every year.  Breast cancer gene (BRCA) assessment is recommended for women who have family members with BRCA-related cancers. BRCA-related cancers include: ? Breast. ? Ovarian. ? Tubal. ? Peritoneal cancers.  Results of the assessment will determine the need for genetic counseling and BRCA1 and BRCA2 testing. Cervical Cancer Your health care provider may recommend that you be screened regularly for cancer of the pelvic organs (ovaries, uterus, and vagina).  This screening involves a pelvic examination, including checking for microscopic changes to the surface of your cervix (Pap test). You may be encouraged to have this screening done every 3 years, beginning at age 21.  For women ages 30-65, health care providers may recommend pelvic exams and Pap testing every 3 years, or they may recommend the Pap and pelvic exam, combined with testing for human papilloma virus (HPV), every 5 years. Some types of HPV increase your risk of cervical cancer. Testing for HPV may also be done on women of any age with unclear Pap test results.  Other health care providers may not recommend any screening for nonpregnant women who are considered low risk for pelvic cancer and who do not have symptoms. Ask your health care provider if a screening pelvic exam is right for you.  If you have had past treatment for cervical cancer or a condition that could lead to cancer, you need Pap tests and screening for cancer for at least 20 years after your treatment. If Pap tests have been discontinued, your risk factors (such as having a new sexual partner) need to be reassessed to determine if screening should resume. Some women have medical problems that increase the chance of getting cervical cancer. In these cases, your health care provider may recommend more frequent screening and Pap tests. Colorectal Cancer  This type of cancer can be detected and often prevented.  Routine colorectal cancer screening usually begins at 46 years of age and continues through 46 years of age.  Your health care provider may recommend screening at an earlier age if you have risk factors for colon cancer.  Your health care provider may also recommend using home test kits to check for hidden blood in the stool.  A small camera at the end of a tube can be used to examine your colon directly (sigmoidoscopy or colonoscopy). This is done to check for the earliest forms of colorectal cancer.  Routine  screening usually begins at age 50.  Direct examination of the colon should be repeated every 5-10 years through 46 years of age. However, you may need to be screened more often if early forms of precancerous polyps or small growths are found. Skin Cancer  Check your skin from head to toe regularly.  Tell your health care provider about any new moles or changes in moles, especially if there is a change in a mole's shape or color.  Also tell your health care provider if you have a mole that is larger than the size of a pencil eraser.  Always use sunscreen. Apply sunscreen liberally and repeatedly throughout the day.  Protect yourself by wearing long sleeves, pants, a wide-brimmed hat, and sunglasses whenever you are outside. Heart disease, diabetes, and high blood pressure  High blood pressure causes heart disease and increases the risk of stroke. High blood pressure is more likely to develop in: ? People who have blood pressure in the high end of the normal range (130-139/85-89 mm Hg). ? People   who are overweight or obese. ? People who are African American.  If you are 84-22 years of age, have your blood pressure checked every 3-5 years. If you are 67 years of age or older, have your blood pressure checked every year. You should have your blood pressure measured twice-once when you are at a hospital or clinic, and once when you are not at a hospital or clinic. Record the average of the two measurements. To check your blood pressure when you are not at a hospital or clinic, you can use: ? An automated blood pressure machine at a pharmacy. ? A home blood pressure monitor.  If you are between 52 years and 3 years old, ask your health care provider if you should take aspirin to prevent strokes.  Have regular diabetes screenings. This involves taking a blood sample to check your fasting blood sugar level. ? If you are at a normal weight and have a low risk for diabetes, have this test once  every three years after 46 years of age. ? If you are overweight and have a high risk for diabetes, consider being tested at a younger age or more often. Preventing infection Hepatitis B  If you have a higher risk for hepatitis B, you should be screened for this virus. You are considered at high risk for hepatitis B if: ? You were born in a country where hepatitis B is common. Ask your health care provider which countries are considered high risk. ? Your parents were born in a high-risk country, and you have not been immunized against hepatitis B (hepatitis B vaccine). ? You have HIV or AIDS. ? You use needles to inject street drugs. ? You live with someone who has hepatitis B. ? You have had sex with someone who has hepatitis B. ? You get hemodialysis treatment. ? You take certain medicines for conditions, including cancer, organ transplantation, and autoimmune conditions. Hepatitis C  Blood testing is recommended for: ? Everyone born from [birth year] through 1963. ? Anyone with known risk factors for hepatitis C. Sexually transmitted infections (STIs)  You should be screened for sexually transmitted infections (STIs) including gonorrhea and chlamydia if: ? You are sexually active and are younger than 46 years of age. ? You are older than 46 years of age and your health care provider tells you that you are at risk for this type of infection. ? Your sexual activity has changed since you were last screened and you are at an increased risk for chlamydia or gonorrhea. Ask your health care provider if you are at risk.  If you do not have HIV, but are at risk, it may be recommended that you take a prescription medicine daily to prevent HIV infection. This is called pre-exposure prophylaxis (PrEP). You are considered at risk if: ? You are sexually active and do not regularly use condoms or know the HIV status of your partner(s). ? You take drugs by injection. ? You are sexually active with a partner  who has HIV. Talk with your health care provider about whether you are at high risk of being infected with HIV. If you choose to begin PrEP, you should first be tested for HIV. You should then be tested every 3 months for as long as you are taking PrEP. Pregnancy  If you are premenopausal and you may become pregnant, ask your health care provider about preconception counseling.  If you may become pregnant, take 400 to 800 micrograms (mcg) of folic acid every  day.  If you want to prevent pregnancy, talk to your health care provider about birth control (contraception). Osteoporosis and menopause  Osteoporosis is a disease in which the bones lose minerals and strength with aging. This can result in serious bone fractures. Your risk for osteoporosis can be identified using a bone density scan.  If you are 38 years of age or older, or if you are at risk for osteoporosis and fractures, ask your health care provider if you should be screened.  Ask your health care provider whether you should take a calcium or vitamin D supplement to lower your risk for osteoporosis.  Menopause may have certain physical symptoms and risks.  Hormone replacement therapy may reduce some of these symptoms and risks. Talk to your health care provider about whether hormone replacement therapy is right for you. Follow these instructions at home:  Schedule regular health, dental, and eye exams.  Stay current with your immunizations.  Do not use any tobacco products including cigarettes, chewing tobacco, or electronic cigarettes.  If you are pregnant, do not drink alcohol.  If you are breastfeeding, limit how much and how often you drink alcohol.  Limit alcohol intake to no more than 1 drink per day for nonpregnant women. One drink equals 12 ounces of beer, 5 ounces of wine, or 1 ounces of hard liquor.  Do not use street drugs.  Do not share needles.  Ask your health care provider for help if you need support  or information about quitting drugs.  Tell your health care provider if you often feel depressed.  Tell your health care provider if you have ever been abused or do not feel safe at home. This information is not intended to replace advice given to you by your health care provider. Make sure you discuss any questions you have with your health care provider. Document Released: 01/15/2011 Document Revised: 12/08/2015 Document Reviewed: 04/05/2015 Elsevier Interactive Patient Education  2019 [place] Maintenance, Female Adopting a healthy lifestyle and getting preventive care can go a long way to promote health and wellness. Talk with your health care provider about what schedule of regular examinations is right for you. This is a good chance for you to check in with your provider about disease prevention and staying healthy. In between checkups, there are plenty of things you can do on your own. Experts have done a lot of research about which lifestyle changes and preventive measures are most likely to keep you healthy. Ask your health care provider for more information. Weight and diet Eat a healthy diet  Be sure to include plenty of vegetables, fruits, low-fat dairy products, and lean protein.  Do not eat a lot of foods high in solid fats, added sugars, or salt.  Get regular exercise. This is one of the most important things you can do for your health. ? Most adults should exercise for at least 150 minutes each week. The exercise should increase your heart rate and make you sweat (moderate-intensity exercise). ? Most adults should also do strengthening exercises at least twice a week. This is in addition to the moderate-intensity exercise. Maintain a healthy weight  Body mass index (BMI) is a measurement that can be used to identify possible weight problems. It estimates body fat based on height and weight. Your health care provider can help determine your BMI and help you achieve  or maintain a healthy weight.  For females 69 years of age and older: ? A BMI below 18.5  is considered underweight. ? A BMI of 18.5 to 24.9 is normal. ? A BMI of 25 to 29.9 is considered overweight. ? A BMI of 30 and above is considered obese. Watch levels of cholesterol and blood lipids  You should start having your blood tested for lipids and cholesterol at 46 years of age, then have this test every 5 years.  You may need to have your cholesterol levels checked more often if: ? Your lipid or cholesterol levels are high. ? You are older than 46 years of age. ? You are at high risk for heart disease. Cancer screening Lung Cancer  Lung cancer screening is recommended for adults 66-13 years old who are at high risk for lung cancer because of a history of smoking.  A yearly low-dose CT scan of the lungs is recommended for people who: ? Currently smoke. ? Have quit within the past 15 years. ? Have at least a 30-pack-year history of smoking. A pack year is smoking an average of one pack of cigarettes a day for 1 year.  Yearly screening should continue until it has been 15 years since you quit.  Yearly screening should stop if you develop a health problem that would prevent you from having lung cancer treatment. Breast Cancer  Practice breast self-awareness. This means understanding how your breasts normally appear and feel.  It also means doing regular breast self-exams. Let your health care provider know about any changes, no matter how small.  If you are in your 20s or 30s, you should have a clinical breast exam (CBE) by a health care provider every 1-3 years as part of a regular health exam.  If you are 58 or older, have a CBE every year. Also consider having a breast X-ray (mammogram) every year.  If you have a family history of breast cancer, talk to your health care provider about genetic screening.  If you are at high risk for breast cancer, talk to your health care provider  about having an MRI and a mammogram every year.  Breast cancer gene (BRCA) assessment is recommended for women who have family members with BRCA-related cancers. BRCA-related cancers include: ? Breast. ? Ovarian. ? Tubal. ? Peritoneal cancers.  Results of the assessment will determine the need for genetic counseling and BRCA1 and BRCA2 testing. Cervical Cancer Your health care provider may recommend that you be screened regularly for cancer of the pelvic organs (ovaries, uterus, and vagina). This screening involves a pelvic examination, including checking for microscopic changes to the surface of your cervix (Pap test). You may be encouraged to have this screening done every 3 years, beginning at age 54.  For women ages 9-65, health care providers may recommend pelvic exams and Pap testing every 3 years, or they may recommend the Pap and pelvic exam, combined with testing for human papilloma virus (HPV), every 5 years. Some types of HPV increase your risk of cervical cancer. Testing for HPV may also be done on women of any age with unclear Pap test results.  Other health care providers may not recommend any screening for nonpregnant women who are considered low risk for pelvic cancer and who do not have symptoms. Ask your health care provider if a screening pelvic exam is right for you.  If you have had past treatment for cervical cancer or a condition that could lead to cancer, you need Pap tests and screening for cancer for at least 20 years after your treatment. If Pap tests have been  discontinued, your risk factors (such as having a new sexual partner) need to be reassessed to determine if screening should resume. Some women have medical problems that increase the chance of getting cervical cancer. In these cases, your health care provider may recommend more frequent screening and Pap tests. Colorectal Cancer  This type of cancer can be detected and often prevented.  Routine colorectal  cancer screening usually begins at 46 years of age and continues through 46 years of age.  Your health care provider may recommend screening at an earlier age if you have risk factors for colon cancer.  Your health care provider may also recommend using home test kits to check for hidden blood in the stool.  A small camera at the end of a tube can be used to examine your colon directly (sigmoidoscopy or colonoscopy). This is done to check for the earliest forms of colorectal cancer.  Routine screening usually begins at age 50.  Direct examination of the colon should be repeated every 5-10 years through 46 years of age. However, you may need to be screened more often if early forms of precancerous polyps or small growths are found. Skin Cancer  Check your skin from head to toe regularly.  Tell your health care provider about any new moles or changes in moles, especially if there is a change in a mole's shape or color.  Also tell your health care provider if you have a mole that is larger than the size of a pencil eraser.  Always use sunscreen. Apply sunscreen liberally and repeatedly throughout the day.  Protect yourself by wearing long sleeves, pants, a wide-brimmed hat, and sunglasses whenever you are outside. Heart disease, diabetes, and high blood pressure  High blood pressure causes heart disease and increases the risk of stroke. High blood pressure is more likely to develop in: ? People who have blood pressure in the high end of the normal range (130-139/85-89 mm Hg). ? People who are overweight or obese. ? People who are African American.  If you are 18-39 years of age, have your blood pressure checked every 3-5 years. If you are 40 years of age or older, have your blood pressure checked every year. You should have your blood pressure measured twice-once when you are at a hospital or clinic, and once when you are not at a hospital or clinic. Record the average of the two  measurements. To check your blood pressure when you are not at a hospital or clinic, you can use: ? An automated blood pressure machine at a pharmacy. ? A home blood pressure monitor.  If you are between 55 years and 79 years old, ask your health care provider if you should take aspirin to prevent strokes.  Have regular diabetes screenings. This involves taking a blood sample to check your fasting blood sugar level. ? If you are at a normal weight and have a low risk for diabetes, have this test once every three years after 45 years of age. ? If you are overweight and have a high risk for diabetes, consider being tested at a younger age or more often. Preventing infection Hepatitis B  If you have a higher risk for hepatitis B, you should be screened for this virus. You are considered at high risk for hepatitis B if: ? You were born in a country where hepatitis B is common. Ask your health care provider which countries are considered high risk. ? Your parents were born in a high-risk country,   and you have not been immunized against hepatitis B (hepatitis B vaccine). ? You have HIV or AIDS. ? You use needles to inject street drugs. ? You live with someone who has hepatitis B. ? You have had sex with someone who has hepatitis B. ? You get hemodialysis treatment. ? You take certain medicines for conditions, including cancer, organ transplantation, and autoimmune conditions. Hepatitis C  Blood testing is recommended for: ? Everyone born from 1943 through 1965. ? Anyone with known risk factors for hepatitis C. Sexually transmitted infections (STIs)  You should be screened for sexually transmitted infections (STIs) including gonorrhea and chlamydia if: ? You are sexually active and are younger than 46 years of age. ? You are older than 46 years of age and your health care provider tells you that you are at risk for this type of infection. ? Your sexual activity has changed since you were last  screened and you are at an increased risk for chlamydia or gonorrhea. Ask your health care provider if you are at risk.  If you do not have HIV, but are at risk, it may be recommended that you take a prescription medicine daily to prevent HIV infection. This is called pre-exposure prophylaxis (PrEP). You are considered at risk if: ? You are sexually active and do not regularly use condoms or know the HIV status of your partner(s). ? You take drugs by injection. ? You are sexually active with a partner who has HIV. Talk with your health care provider about whether you are at high risk of being infected with HIV. If you choose to begin PrEP, you should first be tested for HIV. You should then be tested every 3 months for as long as you are taking PrEP. Pregnancy  If you are premenopausal and you may become pregnant, ask your health care provider about preconception counseling.  If you may become pregnant, take 400 to 800 micrograms (mcg) of folic acid every day.  If you want to prevent pregnancy, talk to your health care provider about birth control (contraception). Osteoporosis and menopause  Osteoporosis is a disease in which the bones lose minerals and strength with aging. This can result in serious bone fractures. Your risk for osteoporosis can be identified using a bone density scan.  If you are 65 years of age or older, or if you are at risk for osteoporosis and fractures, ask your health care provider if you should be screened.  Ask your health care provider whether you should take a calcium or vitamin D supplement to lower your risk for osteoporosis.  Menopause may have certain physical symptoms and risks.  Hormone replacement therapy may reduce some of these symptoms and risks. Talk to your health care provider about whether hormone replacement therapy is right for you. Follow these instructions at home:  Schedule regular health, dental, and eye exams.  Stay current with your  immunizations.  Do not use any tobacco products including cigarettes, chewing tobacco, or electronic cigarettes.  If you are pregnant, do not drink alcohol.  If you are breastfeeding, limit how much and how often you drink alcohol.  Limit alcohol intake to no more than 1 drink per day for nonpregnant women. One drink equals 12 ounces of beer, 5 ounces of wine, or 1 ounces of hard liquor.  Do not use street drugs.  Do not share needles.  Ask your health care provider for help if you need support or information about quitting drugs.  Tell your health   care provider if you often feel depressed.  Tell your health care provider if you have ever been abused or do not feel safe at home. This information is not intended to replace advice given to you by your health care provider. Make sure you discuss any questions you have with your health care provider. Document Released: 01/15/2011 Document Revised: 12/08/2015 Document Reviewed: 04/05/2015 Elsevier Interactive Patient Education  2019 [employer].

## 2018-12-11 LAB — CBC WITH DIFFERENTIAL/PLATELET
Absolute Monocytes: 478 cells/uL (ref 200–950)
Basophils Absolute: 42 cells/uL (ref 0–200)
Basophils Relative: 0.8 %
Eosinophils Absolute: 52 cells/uL (ref 15–500)
Eosinophils Relative: 1 %
HCT: 40.7 % (ref 35.0–45.0)
Hemoglobin: 13.7 g/dL (ref 11.7–15.5)
Lymphs Abs: 1446 cells/uL (ref 850–3900)
MCH: 30.1 pg (ref 27.0–33.0)
MCHC: 33.7 g/dL (ref 32.0–36.0)
MCV: 89.5 fL (ref 80.0–100.0)
MPV: 11.3 fL (ref 7.5–12.5)
Monocytes Relative: 9.2 %
Neutro Abs: 3182 cells/uL (ref 1500–7800)
Neutrophils Relative %: 61.2 %
Platelets: 234 10*3/uL (ref 140–400)
RBC: 4.55 10*6/uL (ref 3.80–5.10)
RDW: 12.4 % (ref 11.0–15.0)
Total Lymphocyte: 27.8 %
WBC: 5.2 10*3/uL (ref 3.8–10.8)

## 2018-12-11 LAB — COMPLETE METABOLIC PANEL WITH GFR
AG Ratio: 1.9 (calc) (ref 1.0–2.5)
ALT: 17 U/L (ref 6–29)
AST: 21 U/L (ref 10–35)
Albumin: 4.3 g/dL (ref 3.6–5.1)
Alkaline phosphatase (APISO): 41 U/L (ref 31–125)
BUN: 13 mg/dL (ref 7–25)
CO2: 27 mmol/L (ref 20–32)
Calcium: 9.3 mg/dL (ref 8.6–10.2)
Chloride: 106 mmol/L (ref 98–110)
Creat: 0.74 mg/dL (ref 0.50–1.10)
GFR, Est African American: 113 mL/min/{1.73_m2} (ref >=60)
GFR, Est Non African American: 97 mL/min/{1.73_m2} (ref >=60)
Globulin: 2.3 g/dL (calc) (ref 1.9–3.7)
Glucose, Bld: 84 mg/dL (ref 65–99)
Potassium: 4.3 mmol/L (ref 3.5–5.3)
Sodium: 141 mmol/L (ref 135–146)
Total Bilirubin: 0.4 mg/dL (ref 0.2–1.2)
Total Protein: 6.6 g/dL (ref 6.1–8.1)

## 2018-12-11 LAB — LIPID PANEL W/REFLEX DIRECT LDL
Cholesterol: 155 mg/dL (ref <200)
HDL: 51 mg/dL (ref >=50)
LDL Cholesterol (Calc): 90 mg/dL (calc)
Non-HDL Cholesterol (Calc): 104 mg/dL (calc) (ref <130)
Total CHOL/HDL Ratio: 3 (calc) (ref <5.0)
Triglycerides: 47 mg/dL (ref <150)

## 2018-12-11 LAB — VITAMIN D 25 HYDROXY (VIT D DEFICIENCY, FRACTURES): Vit D, 25-Hydroxy: 32 ng/mL (ref 30–100)

## 2018-12-11 LAB — FERRITIN: Ferritin: 61 ng/mL (ref 16–232)

## 2018-12-11 LAB — B12 AND FOLATE PANEL
Folate: 21 ng/mL
Vitamin B-12: 406 pg/mL (ref 200–1100)

## 2018-12-11 LAB — B. BURGDORFI ANTIBODIES: B burgdorferi Ab IgG+IgM: <0.9 index

## 2018-12-12 DIAGNOSIS — M898X9 Other specified disorders of bone, unspecified site: Secondary | ICD-10-CM | POA: Insufficient documentation

## 2018-12-12 DIAGNOSIS — H8112 Benign paroxysmal vertigo, left ear: Secondary | ICD-10-CM | POA: Insufficient documentation

## 2018-12-12 NOTE — Progress Notes (Signed)
Call pt: cholesterol looks fantastic. Kidney, liver, glucose look great.  Vitamin D good. Stay on at lest 1000 units daily of D3.  Vitamin b12 looks good.  Iron stores look great.  No evidence of any previous lyme exposure.

## 2019-01-22 ENCOUNTER — Encounter: Payer: Self-pay | Admitting: Physician Assistant

## 2019-04-09 ENCOUNTER — Encounter: Payer: Self-pay | Admitting: Physician Assistant

## 2019-04-09 DIAGNOSIS — D510 Vitamin B12 deficiency anemia due to intrinsic factor deficiency: Secondary | ICD-10-CM

## 2019-04-27 ENCOUNTER — Encounter: Payer: Self-pay | Admitting: Physician Assistant

## 2019-04-28 ENCOUNTER — Ambulatory Visit (INDEPENDENT_AMBULATORY_CARE_PROVIDER_SITE_OTHER): Payer: BLUE CROSS/BLUE SHIELD | Admitting: Physician Assistant

## 2019-04-28 ENCOUNTER — Encounter: Payer: Self-pay | Admitting: Physician Assistant

## 2019-04-28 VITALS — Temp 98.2°F | Ht 67.0 in | Wt 184.0 lb

## 2019-04-28 DIAGNOSIS — R05 Cough: Secondary | ICD-10-CM

## 2019-04-28 DIAGNOSIS — R52 Pain, unspecified: Secondary | ICD-10-CM

## 2019-04-28 DIAGNOSIS — R232 Flushing: Secondary | ICD-10-CM

## 2019-04-28 DIAGNOSIS — R059 Cough, unspecified: Secondary | ICD-10-CM

## 2019-04-28 DIAGNOSIS — J0100 Acute maxillary sinusitis, unspecified: Secondary | ICD-10-CM

## 2019-04-28 DIAGNOSIS — R0981 Nasal congestion: Secondary | ICD-10-CM

## 2019-04-28 MED ORDER — AZITHROMYCIN 250 MG PO TABS: ORAL_TABLET | ORAL | 0 refills | Status: DC

## 2019-04-28 NOTE — Progress Notes (Signed)
Sunday - cough, body aches, hot flashes Spent Saturday in garage cleaning (raining/muggy)  Those symptoms have gone away, now: [place] swollen, sinus drainage down throat  Wants to be tested for Milk allergy

## 2019-04-28 NOTE — Progress Notes (Signed)
Patient ID: Joanna Baker, female   DOB: Mar 30, 1971, 48 y.o.   MRN: [id number] .Marland KitchenVirtual Visit via Video Note  I connected with Joanna Baker on 04/29/19 at 10:50 AM EDT by a video enabled telemedicine application and verified that I am speaking with the correct person using two identifiers.  Location: Patient: work Provider: clinic   I discussed the limitations of evaluation and management by telemedicine and the availability of in person appointments. The patient expressed understanding and agreed to proceed.  History of Present Illness: Pt is a 48 yo female with seasonal and environmental allergies who calls into the clinic with sinus congestion, body aches, and hot flashes that occurred this weekend. Pt spent the night at a friends house in a moldy basement, ate ice cream and woke up the next morning not feeling well. She has a hx of not responding well to dairy. She did not take any anti-histamines. She denies any fever, chills, SOB, GI symptoms, loss of smell or taste. She does have a ongoing cough. Worse in mornings and then clears up usually. A little worse over last few days. She does cough up clear mucus. No other sick contacts. Sudafed seemed to help. Today she is much better but still has some sinus pressure and congestion. She has a hx of sinus infections.  She really wonders about a milk allergy.   .. Active Ambulatory Problems    Diagnosis Date Noted  . Hemorrhoid 08/03/2011  . Right lumbar radiculitis 01/13/2013  . Fatty liver disease, nonalcoholic [id number]  . Depression 12/11/2013  . Anxiety state 12/11/2013  . Premenstrual symptom 11/07/2014  . ACL tear 07/19/2015  . Mass of mandible 09/02/2015  . Right-sided chest pain 05/18/2016  . Radicular syndrome of right leg 05/18/2016  . Pulsatile tinnitus of left ear 05/18/2016  . DUB (dysfunctional uterine bleeding) 05/21/2016  . Pernicious anemia 05/22/2016  . Vitamin D insufficiency 05/22/2016  . SOB (shortness of breath)  12/24/2016  . Palpitations 12/24/2016  . Vitamin B12 deficiency 12/24/2016  . Right ankle swelling 12/24/2016  . Family history of CHF (congestive heart failure) 12/24/2016  . Breast calcification, left 07/26/2017  . Atypical ductal hyperplasia of left breast 08/05/2017  . PVC's (premature ventricular contractions) 10/27/2017  . Right peroneal tendinosis 02/26/2018  . Hand eczema 05/16/2018  . Pain of right thumb 05/16/2018  . Pain of right heel 05/16/2018  . Multiple joint pain 05/16/2018  . Lymphadenopathy 05/16/2018  . BPPV (benign paroxysmal positional vertigo), left 12/12/2018  . Bone pain 12/12/2018   Resolved Ambulatory Problems    Diagnosis Date Noted  . Right leg pain 02/09/2015  . Left knee injury 07/08/2015  . RUQ pain 09/02/2015   Past Medical History:  Diagnosis Date  . ACL injury tear   . Anxiety   . Fatty liver   . IBS (irritable bowel syndrome)   . TB (tuberculosis), treated     Reviewed med, allergy, problem list.  Observations/Objective: No acute distress. No coughing on exam. No labored breathing.  Normal mood and appearance.   . Today's Vitals   04/28/19 0830  Temp: 98.2 F (36.8 C)  TempSrc: Oral  Weight: 184 lb (83.5 kg)  Height: 5\' 7"  (1.702 m)   Body mass index is 28.82 kg/m.    Assessment and Plan: Marland KitchenMarland KitchenYarethzi was seen today for cough and generalized body aches.  Diagnoses and all orders for this visit:  Sinus congestion -     Milk IgE -     Food Allergy  Profile  Body aches -     Milk IgE -     Food Allergy Profile  Hot flashes -     Milk IgE -     Food Allergy Profile  Acute non-recurrent maxillary sinusitis -     azithromycin (ZITHROMAX) 250 MG tablet; Take 2 tablets now and then one tablet for 4 days.  Cough   Will test for milk IgE and IGG allergy. Pt seems to be doing better. Encouraged zyrtec and/or pepcid for any future allergy intolerances. I think patient is resolving but hx of sinus infection. Gave zpak in case  takes a turn for the worse. Hold for another 1-2 days.   Pt has great energy and no other COVID symptoms.   Ongoing morning cough-come in for spirometry.   Follow Up Instructions:    I discussed the assessment and treatment plan with the patient. The patient was provided an opportunity to ask questions and all were answered. The patient agreed with the plan and demonstrated an understanding of the instructions.   The patient was advised to call back or seek an in-person evaluation if the symptoms worsen or if the condition fails to improve as anticipated.   Iran Planas, PA-C

## 2019-04-28 NOTE — Telephone Encounter (Signed)
Added to billing trailer.  °

## 2019-04-30 DIAGNOSIS — R232 Flushing: Secondary | ICD-10-CM | POA: Diagnosis not present

## 2019-04-30 DIAGNOSIS — R0981 Nasal congestion: Secondary | ICD-10-CM | POA: Diagnosis not present

## 2019-04-30 DIAGNOSIS — R52 Pain, unspecified: Secondary | ICD-10-CM | POA: Diagnosis not present

## 2019-05-01 LAB — FOOD ALLERGY PROFILE
Allergen, Salmon, f41: <0.1 kU/L
Almonds: 0.38 kU/L — ABNORMAL HIGH
CLASS: 0
CLASS: 0
CLASS: 0
CLASS: 0
CLASS: 0
CLASS: 1
CLASS: 1
CLASS: 2
CLASS: 2
CLASS: 2
CLASS: 2
Cashew IgE: 0.15 kU/L — ABNORMAL HIGH
Class: 0
Class: 0
Class: 1
Class: 2
Egg White IgE: <0.1 kU/L
Fish Cod: <0.1 kU/L
Hazelnut: 0.73 kU/L — ABNORMAL HIGH
Milk IgE: <0.1 kU/L
Peanut IgE: 1.31 kU/L — ABNORMAL HIGH
Scallop IgE: 1.36 kU/L — ABNORMAL HIGH
Sesame Seed f10: 0.97 kU/L — ABNORMAL HIGH
Shrimp IgE: 0.48 kU/L — ABNORMAL HIGH
Soybean IgE: 0.66 kU/L — ABNORMAL HIGH
Tuna IgE: <0.1 kU/L
Walnut: 0.33 kU/L — ABNORMAL HIGH
Wheat IgE: 0.82 kU/L — ABNORMAL HIGH

## 2019-05-01 LAB — INTERPRETATION:

## 2019-05-01 NOTE — Progress Notes (Signed)
You have some immediate allergy response to many things the highest being scallop/seasome/wheat/peanuts. You do not have to milk or egg.

## 2019-05-14 DIAGNOSIS — Z1231 Encounter for screening mammogram for malignant neoplasm of breast: Secondary | ICD-10-CM | POA: Diagnosis not present

## 2019-05-14 LAB — HM MAMMOGRAPHY

## 2019-06-22 ENCOUNTER — Other Ambulatory Visit: Payer: Self-pay | Admitting: Physician Assistant

## 2019-06-22 MED ORDER — CYANOCOBALAMIN 1000 MCG/ML IJ SOLN: 1000.0000 ug | INTRAMUSCULAR | 3 refills | Status: DC

## 2019-06-22 NOTE — Telephone Encounter (Signed)
Rx needs sig Luvenia Starch

## 2019-06-23 ENCOUNTER — Other Ambulatory Visit: Payer: Self-pay | Admitting: Neurology

## 2019-06-23 DIAGNOSIS — F411 Generalized anxiety disorder: Secondary | ICD-10-CM

## 2019-06-23 MED ORDER — LORAZEPAM 0.5 MG PO TABS: 0.5000 mg | ORAL_TABLET | Freq: Three times a day (TID) | ORAL | 0 refills | Status: DC | PRN

## 2019-06-23 NOTE — Telephone Encounter (Signed)
Last filled 09/16/2018 #30 with 3 refills.  Last appt 12/10/2018.

## 2019-06-25 ENCOUNTER — Encounter: Payer: Self-pay | Admitting: Physician Assistant

## 2019-08-16 ENCOUNTER — Encounter: Payer: Self-pay | Admitting: Physician Assistant

## 2019-09-09 ENCOUNTER — Other Ambulatory Visit: Payer: Self-pay | Admitting: Neurology

## 2019-09-09 DIAGNOSIS — F411 Generalized anxiety disorder: Secondary | ICD-10-CM

## 2019-09-09 MED ORDER — LORAZEPAM 0.5 MG PO TABS: 0.5000 mg | ORAL_TABLET | Freq: Three times a day (TID) | ORAL | 0 refills | Status: DC | PRN

## 2019-09-09 NOTE — Telephone Encounter (Signed)
Please sign referral - patient was told last refill that she needs appt before any further refills.

## 2019-09-09 NOTE — Telephone Encounter (Signed)
Done

## 2019-10-22 ENCOUNTER — Encounter: Payer: Self-pay | Admitting: Physician Assistant

## 2019-10-23 DIAGNOSIS — Z01419 Encounter for gynecological examination (general) (routine) without abnormal findings: Secondary | ICD-10-CM | POA: Diagnosis not present

## 2019-10-23 DIAGNOSIS — Z6827 Body mass index (BMI) 27.0-27.9, adult: Secondary | ICD-10-CM | POA: Diagnosis not present

## 2019-11-11 DIAGNOSIS — L814 Other melanin hyperpigmentation: Secondary | ICD-10-CM | POA: Diagnosis not present

## 2019-11-11 DIAGNOSIS — L579 Skin changes due to chronic exposure to nonionizing radiation, unspecified: Secondary | ICD-10-CM | POA: Diagnosis not present

## 2019-11-11 DIAGNOSIS — D225 Melanocytic nevi of trunk: Secondary | ICD-10-CM | POA: Diagnosis not present

## 2019-11-11 DIAGNOSIS — L821 Other seborrheic keratosis: Secondary | ICD-10-CM | POA: Diagnosis not present

## 2019-11-13 ENCOUNTER — Encounter: Payer: Self-pay | Admitting: Physician Assistant

## 2019-11-22 ENCOUNTER — Other Ambulatory Visit: Payer: Self-pay | Admitting: Physician Assistant

## 2019-11-22 DIAGNOSIS — R059 Cough, unspecified: Secondary | ICD-10-CM

## 2019-11-22 DIAGNOSIS — R05 Cough: Secondary | ICD-10-CM

## 2019-11-22 DIAGNOSIS — J302 Other seasonal allergic rhinitis: Secondary | ICD-10-CM

## 2019-11-22 DIAGNOSIS — Z9109 Other allergy status, other than to drugs and biological substances: Secondary | ICD-10-CM

## 2019-12-25 ENCOUNTER — Telehealth: Payer: Self-pay | Admitting: Neurology

## 2019-12-25 DIAGNOSIS — R059 Cough, unspecified: Secondary | ICD-10-CM

## 2019-12-25 DIAGNOSIS — Z9109 Other allergy status, other than to drugs and biological substances: Secondary | ICD-10-CM

## 2019-12-25 DIAGNOSIS — J302 Other seasonal allergic rhinitis: Secondary | ICD-10-CM

## 2019-12-25 NOTE — Telephone Encounter (Signed)
Please call patient for an appointment for follow up with [facility]. Thanks!

## 2019-12-25 NOTE — Telephone Encounter (Signed)
Left voicemail to schedule appointment

## 2020-01-06 ENCOUNTER — Encounter: Payer: Self-pay | Admitting: Physician Assistant

## 2020-01-06 ENCOUNTER — Ambulatory Visit (INDEPENDENT_AMBULATORY_CARE_PROVIDER_SITE_OTHER): Payer: BC Managed Care – PPO | Admitting: Physician Assistant

## 2020-01-06 VITALS — BP 95/64 | HR 85 | Ht 67.0 in | Wt 181.0 lb

## 2020-01-06 DIAGNOSIS — J302 Other seasonal allergic rhinitis: Secondary | ICD-10-CM | POA: Insufficient documentation

## 2020-01-06 DIAGNOSIS — Z9109 Other allergy status, other than to drugs and biological substances: Secondary | ICD-10-CM | POA: Diagnosis not present

## 2020-01-06 DIAGNOSIS — M25511 Pain in right shoulder: Secondary | ICD-10-CM

## 2020-01-06 DIAGNOSIS — F411 Generalized anxiety disorder: Secondary | ICD-10-CM | POA: Diagnosis not present

## 2020-01-06 DIAGNOSIS — G8929 Other chronic pain: Secondary | ICD-10-CM | POA: Insufficient documentation

## 2020-01-06 DIAGNOSIS — R05 Cough: Secondary | ICD-10-CM

## 2020-01-06 DIAGNOSIS — R059 Cough, unspecified: Secondary | ICD-10-CM | POA: Insufficient documentation

## 2020-01-06 DIAGNOSIS — Z79899 Other long term (current) drug therapy: Secondary | ICD-10-CM

## 2020-01-06 MED ORDER — LORAZEPAM 0.5 MG PO TABS: 0.5000 mg | ORAL_TABLET | Freq: Three times a day (TID) | ORAL | 3 refills | Status: DC | PRN

## 2020-01-06 MED ORDER — ALBUTEROL SULFATE HFA 108 (90 BASE) MCG/ACT IN AERS: 2.0000 | INHALATION_SPRAY | Freq: Four times a day (QID) | RESPIRATORY_TRACT | 2 refills | Status: DC | PRN

## 2020-01-06 MED ORDER — FLUTICASONE PROPIONATE HFA 110 MCG/ACT IN AERO: 2.0000 | INHALATION_SPRAY | Freq: Two times a day (BID) | RESPIRATORY_TRACT | 5 refills | Status: DC

## 2020-01-06 NOTE — Patient Instructions (Addendum)
Proximal Biceps Tendinitis and Tenosynovitis Rehab Ask your health care provider which exercises are safe for you. Do exercises exactly as told by your health care provider and adjust them as directed. It is normal to feel mild stretching, pulling, tightness, or discomfort as you do these exercises. Stop right away if you feel sudden pain or your pain gets worse. Do not begin these exercises until told by your health care provider. Stretching and range-of-motion exercises These exercises warm up your muscles and joints and improve the movement and flexibility of your arm and shoulder. The exercises also help to relieve pain and stiffness. Forearm rotation 1. Stand or sit with your left / right elbow bent in a 90-degree (right angle). Position your forearm so that the thumb is facing the ceiling (neutral position). 2. Rotate your palm up until it cannot go any farther. 3. Hold this position for __________ seconds. 4. Rotate your palm down until it cannot go any farther. 5. Hold this position for __________ seconds. Repeat __________ times. Complete this exercise __________ times a day. Elbow range of motion 1. Stand or sit with your left / right elbow bent in a 90-degree angle (right angle). Position your forearm so that the thumb is facing the ceiling (neutral position). 2. Slowly bend your elbow as far as you can until you feel a stretch or cannot go any farther. 3. Hold this position for __________ seconds. 4. Slowly straighten your elbow as far as you can until you feel a stretch or cannot go any farther. 5. Hold this position for __________ seconds. Repeat __________ times. Complete this exercise __________ times a day. Biceps stretch 1. Stand facing a wall, or stand by a door frame. 2. Raise your left / right arm out to your side, to your shoulder height. Place the thumb side of your hand against the wall. Your palm should be facing the floor (palm down). 3. Keeping your arm straight,  rotate your body in the opposite direction of the raised arm until you feel a gentle stretch in your biceps. 4. Hold this position for __________ seconds. 5. Slowly return to the starting position. Repeat __________ times. Complete this exercise __________ times a day. Shoulder pendulum  1. Stand near a table or counter that you can hold onto for balance. 2. Bend forward at the waist and let your left / right arm hang straight down. Use your other arm to support you and help you stay balanced. 3. Relax your left / right arm and shoulder muscles, and move your hips and your trunk so your left / right arm swings freely. Your arm should swing because of the motion of your body, not because you are using your arm or shoulder muscles. 4. Keep moving your hips and trunk so your arm swings in the following directions, as told by your health care provider: ? Side to side. ? Forward and backward. ? In clockwise and counterclockwise circles. Repeat __________ times. Complete this exercise __________ times a day. Shoulder flexion, assisted  1. Stand facing a wall. Put your left / right palm on the wall. 2. Slowly move your left / right hand up the wall (flexion). Stop when you feel a stretch in your shoulder, or when you reach the angle that is recommended by your health care provider. ? Use your other hand to help raise your arm, if needed (assisted). ? As your hand gets higher, you may need to step closer to the wall. ? Avoid shrugging or  lifting your shoulder up as you raise your arm. To do this, keep your shoulder blade tucked down toward your spine. 3. Hold this position for __________ seconds. 4. Slowly return to the starting position. Use your other arm to help, if needed. Repeat __________ times. Complete this exercise __________ times a day. Shoulder flexion 1. Stand with your left / right arm hanging down at your side. 2. Keep your arm straight as you lift your arm forward and toward the  ceiling (flexion). 3. Hold this position for __________ seconds. 4. Slowly return to the starting position. Repeat __________ times. Complete this exercise __________ times a day. Sleeper stretch, assisted 1. Lie on your left / right side (injured side) with your hips and knees bent and your left / right arm straight in front of you. 2. Bend your elbow to a 90-degree angle (right angle), so your fingers are pointing to the ceiling. 3. Use your other hand to gently push your arm toward the floor (assisted), stopping when you feel a gentle stretch. ? Keep your shoulder blades lightly squeezed together during the exercise. 4. Hold this position for __________ seconds. 5. Slowly return to the starting position. Repeat __________ times. Complete this exercise __________ times a day. Strengthening exercises These exercises build strength and endurance in your arm and shoulder. Endurance is the ability to use your muscles for a long time, even after they get tired. Biceps curls You can use a weight or an exercise band for this exercise. 1. Sit on a stable chair without armrests, or stand up. 2. Hold a __________ weight in your left / right hand, or hold an exercise band with both hands. Your palms should face up toward the ceiling at the starting position. 3. Bend your left / right elbow and move your hand up toward your shoulder. Keep your other arm straight down, in the starting position. 4. Hold this position for __________ seconds. 5. Slowly return to the starting position. Repeat __________ times. Complete this exercise __________ times a day. Internal shoulder rotation You will use an exercise band secured to a stable object at waist height for this exercise. A door and doorframe work well. 1. Stand sideways next to a door with your left / right arm closest to the door, holding the exercise band in your hand. 2. With your elbow bent in a 90-degree angle (right angle) and keeping your elbow at  your side, bring your hand toward your belly (internal rotation). ? Make sure your wrist is staying straight as you do this exercise. 3. Hold this position for __________ seconds. 4. Slowly return to the starting position. Repeat __________ times. Complete this exercise __________ times a day. External shoulder rotation You will use an exercise band secured to a stable object at waist height for this exercise. A door and doorframe work well. 1. Stand sideways next to a door with your left / right arm away from the door, holding the exercise band in your hand. 2. With your elbow bent in a 90-degree angle (right angle) and keeping your elbow at your side, swing your arm away from your body (external rotation). ? Make sure your wrist is staying straight as you do this exercise. 3. Hold this position for __________ seconds. 4. Slowly return to the starting position. Repeat __________ times. Complete this exercise __________ times a day. External shoulder rotation, side-lying You will use a weight to do this exercise. 1. Lie on your uninjured side with your left / right  arm at your side. Bend your elbow to a 90-degree angle (right angle). Hold a __________ weight in your left / right hand. 2. Keeping your elbow at your side, raise your arm toward the ceiling (external rotation). ? Make sure your wrist is staying straight as you do this exercise. 3. Hold this position for __________ seconds. 4. Slowly return to the starting position. Repeat __________ times. Complete this exercise __________ times a day. Scapular retraction Scapular retraction is the process of pulling the shoulder blades (scapulae) toward each other, and toward the spine. You will need an exercise band to do this exercise. 1. Sit in a stable chair without armrests, or stand up. 2. Secure an exercise band to a stable object in front of you so the band is at shoulder height. 3. Hold one end of the exercise band in each  hand. 4. Squeeze your shoulder blades together and move your elbows slightly behind you (retraction). Do not shrug your shoulders upward while you do this. 5. Hold this position for __________ seconds. 6. Slowly return to the starting position. Repeat __________ times. Complete this exercise __________ times a day. Scapular protraction, supine Scapular protraction is the process of moving your shoulder blades away from each other, and away from the spine, while you lie on your back (supine position). 1. Lie on your back on a firm surface. Hold a __________ weight in your left / right hand. 2. Raise your left / right arm straight into the air so your hand is directly above your shoulder joint. 3. Push the weight into the air so your shoulder (scapula) lifts off the surface that you are lying on. Think of trying to punch the ceiling by only moving your scapula forward (protraction). Do not move your head, neck, or back. 4. Hold this position for __________ seconds. 5. Slowly return to the starting position. Repeat __________ times. Complete this exercise __________ times a day. This information is not intended to replace advice given to you by your health care provider. Make sure you discuss any questions you have with your health care provider. Document Revised: 10/28/2018 Document Reviewed: 07/14/2018 Elsevier Patient Education  2020 [place] [employer].   Shoulder Impingement Syndrome Rehab Ask your health care provider which exercises are safe for you. Do exercises exactly as told by your health care provider and adjust them as directed. It is normal to feel mild stretching, pulling, tightness, or discomfort as you do these exercises. Stop right away if you feel sudden pain or your pain gets worse. Do not begin these exercises until told by your health care provider. Stretching and range-of-motion exercise This exercise warms up your muscles and joints and improves the movement and flexibility of  your shoulder. This exercise also helps to relieve pain and stiffness. Passive horizontal adduction In passive adduction, you use your other hand to move the injured arm toward your body. The injured arm does not move on its own. In this movement, your arm is moved across your body in the horizontal plane (horizontal adduction). 1. Sit or stand and pull your left / right elbow across your chest, toward your other shoulder. Stop when you feel a gentle stretch in the back of your shoulder and upper arm. ? Keep your arm at shoulder height. ? Keep your arm as close to your body as you comfortably can. 2. Hold for __________ seconds. 3. Slowly return to the starting position. Repeat __________ times. Complete this exercise __________ times a day. Strengthening exercises These  exercises build strength and endurance in your shoulder. Endurance is the ability to use your muscles for a long time, even after they get tired. External rotation, isometric This is an exercise in which you press the back of your wrist against a door frame without moving your shoulder joint (isometric). 1. Stand or sit in a doorway, facing the door frame. 2. Bend your left / right elbow and place the back of your wrist against the door frame. Only the back of your wrist should be touching the frame. Keep your upper arm at your side. 3. Gently press your wrist against the door frame, as if you are trying to push your arm away from your abdomen (external rotation). Press as hard as you are able without pain. ? Avoid shrugging your shoulder while you press your wrist against the door frame. Keep your shoulder blade tucked down toward the middle of your back. 4. Hold for __________ seconds. 5. Slowly release the tension, and relax your muscles completely before you repeat the exercise. Repeat __________ times. Complete this exercise __________ times a day. Internal rotation, isometric This is an exercise in which you press your  palm against a door frame without moving your shoulder joint (isometric). 1. Stand or sit in a doorway, facing the door frame. 2. Bend your left / right elbow and place the palm of your hand against the door frame. Only your palm should be touching the frame. Keep your upper arm at your side. 3. Gently press your hand against the door frame, as if you are trying to push your arm toward your abdomen (internal rotation). Press as hard as you are able without pain. ? Avoid shrugging your shoulder while you press your hand against the door frame. Keep your shoulder blade tucked down toward the middle of your back. 4. Hold for __________ seconds. 5. Slowly release the tension, and relax your muscles completely before you repeat the exercise. Repeat __________ times. Complete this exercise __________ times a day. Scapular protraction, supine  1. Lie on your back on a firm surface (supine position). Hold a __________ weight in your left / right hand. 2. Raise your left / right arm straight into the air so your hand is directly above your shoulder joint. 3. Push the weight into the air so your shoulder (scapula) lifts off the surface that you are lying on. The scapula will push up or forward (protraction). Do not move your head, neck, or back. 4. Hold for __________ seconds. 5. Slowly return to the starting position. Let your muscles relax completely before you repeat this exercise. Repeat __________ times. Complete this exercise __________ times a day. Scapular retraction  1. Sit in a stable chair without armrests, or stand up. 2. Secure an exercise band to a stable object in front of you so the band is at shoulder height. 3. Hold one end of the exercise band in each hand. Your palms should face down. 4. Squeeze your shoulder blades together (retraction) and move your elbows slightly behind you. Do not shrug your shoulders upward while you do this. 5. Hold for __________ seconds. 6. Slowly return to  the starting position. Repeat __________ times. Complete this exercise __________ times a day. Shoulder extension  1. Sit in a stable chair without armrests, or stand up. 2. Secure an exercise band to a stable object in front of you so the band is above shoulder height. 3. Hold one end of the exercise band in each hand. 4. Straighten  your elbows and lift your hands up to shoulder height. 5. Squeeze your shoulder blades together and pull your hands down to the sides of your thighs (extension). Stop when your hands are straight down by your sides. Do not let your hands go behind your body. 6. Hold for __________ seconds. 7. Slowly return to the starting position. Repeat __________ times. Complete this exercise __________ times a day. This information is not intended to replace advice given to you by your health care provider. Make sure you discuss any questions you have with your health care provider. Document Revised: 10/24/2018 Document Reviewed: 07/28/2018 Elsevier Patient Education  [place].

## 2020-01-06 NOTE — Progress Notes (Signed)
Subjective:    Patient ID: Joanna Baker, female    DOB: [birth year], 49 y.o.   MRN: [id number]  HPI  Patient is a 49 year old female with anxiety, seasonal allergies who presents to the clinic for refills and to discuss chronic right shoulder pain.  She continues to have problems with her allergies.  She is using her albuterol inhaler almost daily.  She does have a dry cough especially in the mornings.  The albuterol inhaler in the morning usually clears that up.  She is taking Chlortab daily.  Her anxiety is doing pretty well.  She is just using Ativan as needed.  That is once or twice a week.  She is also seeing a therapist.  She is also trying to stay active.  She denies any significant problems with anxiety at this time.  Patient is having chronic right shoulder pain for the last 6 months.  She denies any known injury.  Her pain tends to be more in the anterior right shoulder.  It is made worse with lifting.  She does not do a whole lot of lifting right now.  She did recently just have a job change.  It does feel better at rest and with ibuprofen.  She denies any radicular pain or symptoms.  She denies any neck pain.  She denies any known strength pain.  Some days are better than others.  .. Active Ambulatory Problems    Diagnosis Date Noted   Hemorrhoid 08/03/2011   Right lumbar radiculitis 01/13/2013   Fatty liver disease, nonalcoholic 82/42/3536   Depression 12/11/2013   Anxiety state 12/11/2013   Premenstrual symptom 11/07/2014   ACL tear 07/19/2015   Mass of mandible 09/02/2015   Right-sided chest pain 05/18/2016   Radicular syndrome of right leg 05/18/2016   Pulsatile tinnitus of left ear 05/18/2016   DUB (dysfunctional uterine bleeding) 05/21/2016   Pernicious anemia 05/22/2016   Vitamin D insufficiency 05/22/2016   SOB (shortness of breath) 12/24/2016   Palpitations 12/24/2016   Vitamin B12 deficiency 12/24/2016   Right ankle swelling 12/24/2016    Family history of CHF (congestive heart failure) 12/24/2016   Breast calcification, left 07/26/2017   Atypical ductal hyperplasia of left breast 08/05/2017   PVC's (premature ventricular contractions) 10/27/2017   Right peroneal tendinosis 02/26/2018   Hand eczema 05/16/2018   Pain of right thumb 05/16/2018   Pain of right heel 05/16/2018   Multiple joint pain 05/16/2018   Lymphadenopathy 05/16/2018   BPPV (benign paroxysmal positional vertigo), left 12/12/2018   Bone pain 12/12/2018   Chronic right shoulder pain 01/06/2020   Cough 01/06/2020   Environmental allergies 01/06/2020   Seasonal allergies 01/06/2020   Resolved Ambulatory Problems    Diagnosis Date Noted   Right leg pain 02/09/2015   Left knee injury 07/08/2015   RUQ pain 09/02/2015   Past Medical History:  Diagnosis Date   ACL injury tear    Anxiety    Fatty liver    IBS (irritable bowel syndrome)    TB (tuberculosis), treated      Review of Systems See HPI.     Objective:   Physical Exam Vitals reviewed.  Constitutional:      Appearance: Normal appearance.  HENT:     Head: Normocephalic.  Cardiovascular:     Rate and Rhythm: Normal rate and regular rhythm.     Pulses: Normal pulses.  Pulmonary:     Effort: Pulmonary effort is normal.     Breath sounds: Normal breath  sounds.  Musculoskeletal:     Comments: Right shoulder:  Normal ROM Strength 5/5.  Pain with palpation over anterior shoulder of bicep tendon insertion.  Negative drop arm.  External ROM pain.   NROM of neck.   Neurological:     General: No focal deficit present.     Mental Status: She is alert and oriented to person, place, and time.  Psychiatric:        Mood and Affect: Mood normal.       .. Depression screen [facility] 2/9 01/06/2020 12/10/2018 05/13/2018 10/25/2017 03/25/2017  Decreased Interest 0 0 0 0 1  Down, Depressed, Hopeless 0 0 0 0 0  PHQ - 2 Score 0 0 0 0 1  Altered sleeping 0 0 0 0 -  Tired,  decreased energy 0 0 0 0 -  Change in appetite 0 0 1 0 -  Feeling bad or failure about yourself  0 0 0 0 -  Trouble concentrating 0 0 0 1 -  Moving slowly or fidgety/restless 0 0 0 0 -  Suicidal thoughts 0 0 0 0 -  PHQ-9 Score 0 0 1 1 -  Difficult doing work/chores Not difficult at all Not difficult at all Not difficult at all Not difficult at all -   .. GAD 7 : Generalized Anxiety Score 01/06/2020 12/10/2018 05/13/2018 10/25/2017  Nervous, Anxious, on Edge 1 0 1 1  Control/stop worrying 0 0 0 0  Worry too much - different things 0 0 0 0  Trouble relaxing 0 0 0 0  Restless 0 0 0 0  Easily annoyed or irritable 1 2 0 0  Afraid - awful might happen 0 0 0 0  Total GAD 7 Score 2 2 1 1   Anxiety Difficulty Not difficult at all Somewhat difficult Not difficult at all Not difficult at all        Assessment & Plan:  Marland KitchenMarland KitchenMaryjane was seen today for follow-up.  Diagnoses and all orders for this visit:  Chronic right shoulder pain  GAD (generalized anxiety disorder) -     LORazepam (ATIVAN) 0.5 MG tablet; Take 1 tablet (0.5 mg total) by mouth every 8 (eight) hours as needed. for anxiety  Seasonal allergies -     albuterol (PROAIR HFA) 108 (90 Base) MCG/ACT inhaler; Inhale 2 puffs into the lungs every 6 (six) hours as needed for wheezing or shortness of breath. -     fluticasone (FLOVENT HFA) 110 MCG/ACT inhaler; Inhale 2 puffs into the lungs in the morning and at bedtime.  Environmental allergies -     albuterol (PROAIR HFA) 108 (90 Base) MCG/ACT inhaler; Inhale 2 puffs into the lungs every 6 (six) hours as needed for wheezing or shortness of breath. -     fluticasone (FLOVENT HFA) 110 MCG/ACT inhaler; Inhale 2 puffs into the lungs in the morning and at bedtime.  Cough -     albuterol (PROAIR HFA) 108 (90 Base) MCG/ACT inhaler; Inhale 2 puffs into the lungs every 6 (six) hours as needed for wheezing or shortness of breath. -     fluticasone (FLOVENT HFA) 110 MCG/ACT inhaler; Inhale 2 puffs  into the lungs in the morning and at bedtime.  Medication management -     COMPLETE METABOLIC PANEL WITH GFR   I feel like her chronic right shoulder pain could be more her bicipital tendinitis although she is having some posterior pain to that could represent some rotator cuff tendinitis.  I like for her to start  some home exercises.  I like for her to ice and use IcyHot patches.  I like for her to use ibuprofen regularly for the next 2 weeks.  Encouraged her to not do any heavy lifting.  If this continues follow-up with our sports medicine Dr. Darene Lamer.  Patient declines x-ray today.  She is using her albuterol inhaler daily.  I think we should try an ICS inhaler.  Patient agrees.  Refilled her albuterol.  Will start Flovent.

## 2020-01-19 ENCOUNTER — Encounter: Payer: Self-pay | Admitting: Physician Assistant

## 2020-01-19 NOTE — Progress Notes (Signed)
Negative for intraepithelial lesion or malignancy.  

## 2020-03-16 ENCOUNTER — Other Ambulatory Visit: Payer: Self-pay

## 2020-03-16 ENCOUNTER — Encounter: Payer: Self-pay | Admitting: Physician Assistant

## 2020-03-16 ENCOUNTER — Ambulatory Visit (INDEPENDENT_AMBULATORY_CARE_PROVIDER_SITE_OTHER): Payer: BC Managed Care – PPO | Admitting: Physician Assistant

## 2020-03-16 VITALS — BP 118/66 | HR 90 | Ht 67.0 in | Wt 182.0 lb

## 2020-03-16 DIAGNOSIS — E559 Vitamin D deficiency, unspecified: Secondary | ICD-10-CM | POA: Diagnosis not present

## 2020-03-16 DIAGNOSIS — Z1211 Encounter for screening for malignant neoplasm of colon: Secondary | ICD-10-CM

## 2020-03-16 DIAGNOSIS — E538 Deficiency of other specified B group vitamins: Secondary | ICD-10-CM

## 2020-03-16 DIAGNOSIS — Z131 Encounter for screening for diabetes mellitus: Secondary | ICD-10-CM

## 2020-03-16 DIAGNOSIS — Z0000 Encounter for general adult medical examination without abnormal findings: Secondary | ICD-10-CM | POA: Diagnosis not present

## 2020-03-16 DIAGNOSIS — R7309 Other abnormal glucose: Secondary | ICD-10-CM | POA: Diagnosis not present

## 2020-03-16 DIAGNOSIS — Z1329 Encounter for screening for other suspected endocrine disorder: Secondary | ICD-10-CM

## 2020-03-16 DIAGNOSIS — Z13220 Encounter for screening for lipoid disorders: Secondary | ICD-10-CM

## 2020-03-16 NOTE — Patient Instructions (Signed)
Health Maintenance, Female Adopting a healthy lifestyle and getting preventive care are important in promoting health and wellness. Ask your health care provider about:  The right schedule for you to have regular tests and exams.  Things you can do on your own to prevent diseases and keep yourself healthy. What should I know about diet, weight, and exercise? Eat a healthy diet   Eat a diet that includes plenty of vegetables, fruits, low-fat dairy products, and lean protein.  Do not eat a lot of foods that are high in solid fats, added sugars, or sodium. Maintain a healthy weight Body mass index (BMI) is used to identify weight problems. It estimates body fat based on height and weight. Your health care provider can help determine your BMI and help you achieve or maintain a healthy weight. Get regular exercise Get regular exercise. This is one of the most important things you can do for your health. Most adults should:  Exercise for at least 150 minutes each week. The exercise should increase your heart rate and make you sweat (moderate-intensity exercise).  Do strengthening exercises at least twice a week. This is in addition to the moderate-intensity exercise.  Spend less time sitting. Even light physical activity can be beneficial. Watch cholesterol and blood lipids Have your blood tested for lipids and cholesterol at 47 years of age, then have this test every 5 years. Have your cholesterol levels checked more often if:  Your lipid or cholesterol levels are high.  You are older than 47 years of age.  You are at high risk for heart disease. What should I know about cancer screening? Depending on your health history and family history, you may need to have cancer screening at various ages. This may include screening for:  Breast cancer.  Cervical cancer.  Colorectal cancer.  Skin cancer.  Lung cancer. What should I know about heart disease, diabetes, and high blood  pressure? Blood pressure and heart disease  High blood pressure causes heart disease and increases the risk of stroke. This is more likely to develop in people who have high blood pressure readings, are of African descent, or are overweight.  Have your blood pressure checked: ? Every 3-5 years if you are 18-39 years of age. ? Every year if you are 40 years old or older. Diabetes Have regular diabetes screenings. This checks your fasting blood sugar level. Have the screening done:  Once every three years after age 40 if you are at a normal weight and have a low risk for diabetes.  More often and at a younger age if you are overweight or have a high risk for diabetes. What should I know about preventing infection? Hepatitis B If you have a higher risk for hepatitis B, you should be screened for this virus. Talk with your health care provider to find out if you are at risk for hepatitis B infection. Hepatitis C Testing is recommended for:  Everyone born from 1945 through 1965.  Anyone with known risk factors for hepatitis C. Sexually transmitted infections (STIs)  Get screened for STIs, including gonorrhea and chlamydia, if: ? You are sexually active and are younger than 47 years of age. ? You are older than 47 years of age and your health care provider tells you that you are at risk for this type of infection. ? Your sexual activity has changed since you were last screened, and you are at increased risk for chlamydia or gonorrhea. Ask your health care provider if   you are at risk.  Ask your health care provider about whether you are at high risk for HIV. Your health care provider may recommend a prescription medicine to help prevent HIV infection. If you choose to take medicine to prevent HIV, you should first get tested for HIV. You should then be tested every 3 months for as long as you are taking the medicine. Pregnancy  If you are about to stop having your period (premenopausal) and  you may become pregnant, seek counseling before you get pregnant.  Take 400 to 800 micrograms (mcg) of folic acid every day if you become pregnant.  Ask for birth control (contraception) if you want to prevent pregnancy. Osteoporosis and menopause Osteoporosis is a disease in which the bones lose minerals and strength with aging. This can result in bone fractures. If you are 65 years old or older, or if you are at risk for osteoporosis and fractures, ask your health care provider if you should:  Be screened for bone loss.  Take a calcium or vitamin D supplement to lower your risk of fractures.  Be given hormone replacement therapy (HRT) to treat symptoms of menopause. Follow these instructions at home: Lifestyle  Do not use any products that contain nicotine or tobacco, such as cigarettes, e-cigarettes, and chewing tobacco. If you need help quitting, ask your health care provider.  Do not use street drugs.  Do not share needles.  Ask your health care provider for help if you need support or information about quitting drugs. Alcohol use  Do not drink alcohol if: ? Your health care provider tells you not to drink. ? You are pregnant, may be pregnant, or are planning to become pregnant.  If you drink alcohol: ? Limit how much you use to 0-1 drink a day. ? Limit intake if you are breastfeeding.  Be aware of how much alcohol is in your drink. In the [place] one drink equals one 12 oz bottle of beer (355 mL), one 5 oz glass of wine (148 mL), or one 1 oz glass of hard liquor (44 mL). General instructions  Schedule regular health, dental, and eye exams.  Stay current with your vaccines.  Tell your health care provider if: ? You often feel depressed. ? You have ever been abused or do not feel safe at home. Summary  Adopting a healthy lifestyle and getting preventive care are important in promoting health and wellness.  Follow your health care provider's instructions about healthy  diet, exercising, and getting tested or screened for diseases.  Follow your health care provider's instructions on monitoring your cholesterol and blood pressure. This information is not intended to replace advice given to you by your health care provider. Make sure you discuss any questions you have with your health care provider. Document Revised: 06/25/2018 Document Reviewed: 06/25/2018 Elsevier Patient Education  2020 [employer]  

## 2020-03-16 NOTE — Progress Notes (Signed)
Subjective:     Joanna Baker is a 49 y.o. female and is here for a comprehensive physical exam. The patient reports problems - see below.   Her father died from suicide and was able to get his PMH. CHF/DM/Alcohol Abuse. She wants to make sure she is being checked out for everything.   Pt did have 2 back to back bee stings. She had to go to [facility]. New epi pen were prescribed. She is taking claritin. She was going into anaphylactic shock.  Social History   Socioeconomic History  . Marital status: Single    Spouse name: Not on file  . Number of children: Not on file  . Years of education: Not on file  . Highest education level: Not on file  Occupational History  . Not on file  Tobacco Use  . Smoking status: Former Smoker    Packs/day: 0.25    Years: 5.00    Pack years: 1.25    Types: Cigarettes  . Smokeless tobacco: Never Used  Substance and Sexual Activity  . Alcohol use: Yes    Alcohol/week: 2.0 standard drinks    Types: 2 Cans of beer per week  . Drug use: Yes    Types: Marijuana    Comment: history of use, not current use  . Sexual activity: Yes    Partners: Female  Other Topics Concern  . Not on file  Social History Narrative  . Not on file   Social Determinants of Health   Financial Resource Strain:   . Difficulty of Paying Living Expenses: Not on file  Food Insecurity:   . Worried About Charity fundraiser in the Last Year: Not on file  . Ran Out of Food in the Last Year: Not on file  Transportation Needs:   . Lack of Transportation (Medical): Not on file  . Lack of Transportation (Non-Medical): Not on file  Physical Activity:   . Days of Exercise per Week: Not on file  . Minutes of Exercise per Session: Not on file  Stress:   . Feeling of Stress : Not on file  Social Connections:   . Frequency of Communication with Friends and Family: Not on file  . Frequency of Social Gatherings with Friends and Family: Not on file  . Attends Religious Services: Not on file   . Active Member of Clubs or Organizations: Not on file  . Attends Archivist Meetings: Not on file  . Marital Status: Not on file  Intimate Partner Violence:   . Fear of Current or Ex-Partner: Not on file  . Emotionally Abused: Not on file  . Physically Abused: Not on file  . Sexually Abused: Not on file   Health Maintenance  Topic Date Due  . INFLUENZA VACCINE  10/13/2020 (Originally 02/14/2020)  . MAMMOGRAM  05/13/2020  . PAP SMEAR-Modifier  09/13/2023  . TETANUS/TDAP  12/09/2028  . COVID-19 Vaccine  Completed  . Hepatitis C Screening  Completed  . HIV Screening  Completed    The following portions of the patient's history were reviewed and updated as appropriate: allergies, current medications, past family history, past medical history, past social history, past surgical history and problem list.  Review of Systems Pertinent items noted in HPI and remainder of comprehensive ROS otherwise negative.   Objective:    BP 118/66   Pulse 90   Ht 5\' 7"  (1.702 m)   Wt 182 lb (82.6 kg)   SpO2 97%   BMI 28.51 kg/m  General appearance: alert, cooperative and appears stated age Head: Normocephalic, without obvious abnormality, atraumatic Eyes: conjunctivae/corneas clear. PERRL, EOM's intact. Fundi benign. Ears: normal TM's and external ear canals both ears Nose: Nares normal. Septum midline. Mucosa normal. No drainage or sinus tenderness. Throat: lips, mucosa, and tongue normal; teeth and gums normal Neck: no adenopathy, no carotid bruit, no JVD, supple, symmetrical, trachea midline and thyroid not enlarged, symmetric, no tenderness/mass/nodules Back: symmetric, no curvature. ROM normal. No CVA tenderness. Lungs: clear to auscultation bilaterally Heart: regular rate and rhythm, S1, S2 normal, no murmur, click, rub or gallop Abdomen: soft, non-tender; bowel sounds normal; no masses,  no organomegaly Extremities: extremities normal, atraumatic, no cyanosis or edema Pulses:  2+ and symmetric Skin: Skin color, texture, turgor normal. No rashes or lesions or right hand still swollen and tender with scant redness.  Lymph nodes: Cervical, supraclavicular, and axillary nodes normal. Neurologic: Alert and oriented X 3, normal strength and tone. Normal symmetric reflexes. Normal coordination and gait   .Marland Kitchen Depression screen [facility] 2/9 03/16/2020 01/06/2020 12/10/2018 05/13/2018 10/25/2017  Decreased Interest 0 0 0 0 0  Down, Depressed, Hopeless 0 0 0 0 0  PHQ - 2 Score 0 0 0 0 0  Altered sleeping - 0 0 0 0  Tired, decreased energy - 0 0 0 0  Change in appetite - 0 0 1 0  Feeling bad or failure about yourself  - 0 0 0 0  Trouble concentrating - 0 0 0 1  Moving slowly or fidgety/restless - 0 0 0 0  Suicidal thoughts - 0 0 0 0  PHQ-9 Score - 0 0 1 1  Difficult doing work/chores - Not difficult at all Not difficult at all Not difficult at all Not difficult at all   .Marland Kitchen GAD 7 : Generalized Anxiety Score 01/06/2020 12/10/2018 05/13/2018 10/25/2017  Nervous, Anxious, on Edge 1 0 1 1  Control/stop worrying 0 0 0 0  Worry too much - different things 0 0 0 0  Trouble relaxing 0 0 0 0  Restless 0 0 0 0  Easily annoyed or irritable 1 2 0 0  Afraid - awful might happen 0 0 0 0  Total GAD 7 Score 2 2 1 1   Anxiety Difficulty Not difficult at all Somewhat difficult Not difficult at all Not difficult at all     Assessment:    Healthy female exam.      Plan:    Marland KitchenMarland KitchenMargorie was seen today for annual exam.  Diagnoses and all orders for this visit:  Routine physical examination -     Lipid Panel w/reflex Direct LDL -     COMPLETE METABOLIC PANEL WITH GFR -     Ambulatory referral to Gastroenterology -     B12 and Folate Panel -     VITAMIN D 25 Hydroxy (Vit-D Deficiency, Fractures) -     TSH -     CBC  Vitamin B12 deficiency -     B12 and Folate Panel  Vitamin D insufficiency -     VITAMIN D 25 Hydroxy (Vit-D Deficiency, Fractures)  Screening for diabetes mellitus -      COMPLETE METABOLIC PANEL WITH GFR  Screening for lipid disorders -     Lipid Panel w/reflex Direct LDL  Colon cancer screening -     Ambulatory referral to Gastroenterology  Thyroid disorder screen -     TSH -     CBC   .Marland Kitchen Discussed 150 minutes of exercise a  week.  Encouraged vitamin D 1000 units and Calcium 1300mg  or 4 servings of dairy a day.  Fasting labs ordered.  Mammogram UTD.  Pap UTD.  Vaccines up to date.  Declined flu vaccine.  Ordered colonoscopy for 64 and older.   Ok to hold off for another couple of days unless area around sting gets red or more tender.    See After Visit Summary for Counseling Recommendations

## 2020-03-17 LAB — COMPLETE METABOLIC PANEL WITH GFR
AG Ratio: 2 (calc) (ref 1.0–2.5)
ALT: 13 U/L (ref 6–29)
AST: 15 U/L (ref 10–35)
Albumin: 4.5 g/dL (ref 3.6–5.1)
Alkaline phosphatase (APISO): 37 U/L (ref 31–125)
BUN: 13 mg/dL (ref 7–25)
CO2: 29 mmol/L (ref 20–32)
Calcium: 9.5 mg/dL (ref 8.6–10.2)
Chloride: 106 mmol/L (ref 98–110)
Creat: 0.66 mg/dL (ref 0.50–1.10)
GFR, Est African American: 122 mL/min/{1.73_m2} (ref >=60)
GFR, Est Non African American: 105 mL/min/{1.73_m2} (ref >=60)
Globulin: 2.3 g/dL (calc) (ref 1.9–3.7)
Glucose, Bld: 100 mg/dL — ABNORMAL HIGH (ref 65–99)
Potassium: 4.2 mmol/L (ref 3.5–5.3)
Sodium: 143 mmol/L (ref 135–146)
Total Bilirubin: 0.4 mg/dL (ref 0.2–1.2)
Total Protein: 6.8 g/dL (ref 6.1–8.1)

## 2020-03-17 LAB — B12 AND FOLATE PANEL
Folate: 22.5 ng/mL
Vitamin B-12: 531 pg/mL (ref 200–1100)

## 2020-03-17 LAB — LIPID PANEL W/REFLEX DIRECT LDL
Cholesterol: 172 mg/dL (ref <200)
HDL: 69 mg/dL (ref >=50)
LDL Cholesterol (Calc): 92 mg/dL (calc)
Non-HDL Cholesterol (Calc): 103 mg/dL (calc) (ref <130)
Total CHOL/HDL Ratio: 2.5 (calc) (ref <5.0)
Triglycerides: 37 mg/dL (ref <150)

## 2020-03-17 LAB — VITAMIN D 25 HYDROXY (VIT D DEFICIENCY, FRACTURES): Vit D, 25-Hydroxy: 25 ng/mL — ABNORMAL LOW (ref 30–100)

## 2020-03-17 NOTE — Progress Notes (Signed)
Drea,   Thyroid normal range. Cholesterol looks fantastic. No anemia. B12 great. Kidney and liver great. Vitamin D just a little low make sure takng at least 1000units daily. Your fasting sugar is up just a hair. Lets add an a1c to labs and see what average is.

## 2020-03-18 LAB — CBC
HCT: 39.7 % (ref 35.0–45.0)
Hemoglobin: 13.1 g/dL (ref 11.7–15.5)
MCH: 29.9 pg (ref 27.0–33.0)
MCHC: 33 g/dL (ref 32.0–36.0)
MCV: 90.6 fL (ref 80.0–100.0)
MPV: 11.2 fL (ref 7.5–12.5)
Platelets: 223 10*3/uL (ref 140–400)
RBC: 4.38 10*6/uL (ref 3.80–5.10)
RDW: 11.9 % (ref 11.0–15.0)
WBC: 10.3 10*3/uL (ref 3.8–10.8)

## 2020-03-18 LAB — HEMOGLOBIN A1C W/OUT EAG: Hgb A1c MFr Bld: 5.3 % of total Hgb (ref <5.7)

## 2020-03-18 LAB — TSH: TSH: 0.67 mIU/L

## 2020-03-22 NOTE — Progress Notes (Signed)
Joanna Baker,   A1C is 5.3 normal range. No diabetes.

## 2020-03-25 ENCOUNTER — Encounter: Payer: Self-pay | Admitting: Physician Assistant

## 2020-05-17 DIAGNOSIS — Z1231 Encounter for screening mammogram for malignant neoplasm of breast: Secondary | ICD-10-CM | POA: Diagnosis not present

## 2020-05-17 LAB — HM MAMMOGRAPHY

## 2020-06-02 ENCOUNTER — Encounter: Payer: Self-pay | Admitting: Neurology

## 2020-06-29 DIAGNOSIS — Z8371 Family history of colonic polyps: Secondary | ICD-10-CM | POA: Diagnosis not present

## 2020-06-29 DIAGNOSIS — K648 Other hemorrhoids: Secondary | ICD-10-CM | POA: Diagnosis not present

## 2020-06-29 DIAGNOSIS — Z1211 Encounter for screening for malignant neoplasm of colon: Secondary | ICD-10-CM | POA: Diagnosis not present

## 2020-06-29 LAB — HM COLONOSCOPY

## 2020-07-05 ENCOUNTER — Encounter: Payer: Self-pay | Admitting: Neurology

## 2020-07-28 ENCOUNTER — Encounter: Payer: Self-pay | Admitting: Physician Assistant

## 2020-07-29 DIAGNOSIS — Z20822 Contact with and (suspected) exposure to covid-19: Secondary | ICD-10-CM | POA: Diagnosis not present

## 2020-08-01 ENCOUNTER — Other Ambulatory Visit: Payer: Self-pay | Admitting: Physician Assistant

## 2020-08-07 ENCOUNTER — Encounter: Payer: Self-pay | Admitting: Physician Assistant

## 2020-08-07 DIAGNOSIS — F411 Generalized anxiety disorder: Secondary | ICD-10-CM

## 2020-08-08 MED ORDER — LORAZEPAM 0.5 MG PO TABS: 0.5000 mg | ORAL_TABLET | Freq: Three times a day (TID) | ORAL | 1 refills | Status: DC | PRN

## 2020-08-24 ENCOUNTER — Encounter: Payer: Self-pay | Admitting: Physician Assistant

## 2020-08-24 DIAGNOSIS — F4321 Adjustment disorder with depressed mood: Secondary | ICD-10-CM | POA: Diagnosis not present

## 2020-10-26 DIAGNOSIS — F4321 Adjustment disorder with depressed mood: Secondary | ICD-10-CM | POA: Diagnosis not present

## 2020-10-26 DIAGNOSIS — F4323 Adjustment disorder with mixed anxiety and depressed mood: Secondary | ICD-10-CM | POA: Diagnosis not present

## 2020-11-02 DIAGNOSIS — Z6831 Body mass index (BMI) 31.0-31.9, adult: Secondary | ICD-10-CM | POA: Diagnosis not present

## 2020-11-02 DIAGNOSIS — Z01419 Encounter for gynecological examination (general) (routine) without abnormal findings: Secondary | ICD-10-CM | POA: Diagnosis not present

## 2020-11-09 DIAGNOSIS — L821 Other seborrheic keratosis: Secondary | ICD-10-CM | POA: Diagnosis not present

## 2020-11-09 DIAGNOSIS — D225 Melanocytic nevi of trunk: Secondary | ICD-10-CM | POA: Diagnosis not present

## 2020-11-09 DIAGNOSIS — L814 Other melanin hyperpigmentation: Secondary | ICD-10-CM | POA: Diagnosis not present

## 2020-11-09 DIAGNOSIS — L579 Skin changes due to chronic exposure to nonionizing radiation, unspecified: Secondary | ICD-10-CM | POA: Diagnosis not present

## 2020-11-16 ENCOUNTER — Ambulatory Visit (INDEPENDENT_AMBULATORY_CARE_PROVIDER_SITE_OTHER): Payer: BC Managed Care – PPO | Admitting: Physician Assistant

## 2020-11-16 ENCOUNTER — Other Ambulatory Visit: Payer: Self-pay

## 2020-11-16 VITALS — BP 112/60 | HR 96 | Ht 67.0 in | Wt 202.0 lb

## 2020-11-16 DIAGNOSIS — J302 Other seasonal allergic rhinitis: Secondary | ICD-10-CM

## 2020-11-16 DIAGNOSIS — Z9109 Other allergy status, other than to drugs and biological substances: Secondary | ICD-10-CM

## 2020-11-16 DIAGNOSIS — R062 Wheezing: Secondary | ICD-10-CM

## 2020-11-16 DIAGNOSIS — F411 Generalized anxiety disorder: Secondary | ICD-10-CM

## 2020-11-16 DIAGNOSIS — R059 Cough, unspecified: Secondary | ICD-10-CM

## 2020-11-16 DIAGNOSIS — E538 Deficiency of other specified B group vitamins: Secondary | ICD-10-CM

## 2020-11-16 MED ORDER — CYANOCOBALAMIN 1000 MCG/ML IJ SOLN: INTRAMUSCULAR | 3 refills | Status: DC

## 2020-11-16 MED ORDER — MONTELUKAST SODIUM 10 MG PO TABS: 10.0000 mg | ORAL_TABLET | Freq: Every day | ORAL | 3 refills | Status: DC

## 2020-11-16 MED ORDER — LORAZEPAM 0.5 MG PO TABS: 0.5000 mg | ORAL_TABLET | Freq: Three times a day (TID) | ORAL | 1 refills | Status: DC | PRN

## 2020-11-16 MED ORDER — ALBUTEROL SULFATE HFA 108 (90 BASE) MCG/ACT IN AERS: 2.0000 | INHALATION_SPRAY | Freq: Four times a day (QID) | RESPIRATORY_TRACT | 1 refills | Status: DC | PRN

## 2020-11-16 NOTE — Progress Notes (Signed)
Subjective:    Patient ID: Joanna Baker, female    DOB: 12/02/1970, 50 y.o.   MRN: [id number]  HPI  Pt is a 50 yo female with seasonal and environmental allergies with ongoing cough and wheezing who was taking flovent and wants to discuss more affordable option. No recent lung testing. She takes chlortabs daily. flovent is about 55 dollars a month. She has not used flovent for about one month and start wheezing and coughing. She does not have rescue inhaler.    Ativan as needed. In counseling. She is using ativan very sparinly.   She gives herself b12 shots monthly. She started giving them to her in the abdomen and started bruising badly. Needs refills.   .. Active Ambulatory Problems    Diagnosis Date Noted  . Hemorrhoid 08/03/2011  . Right lumbar radiculitis 01/13/2013  . Fatty liver disease, nonalcoholic 40/98/1191  . Depression 12/11/2013  . GAD (generalized anxiety disorder) 12/11/2013  . Premenstrual symptom 11/07/2014  . ACL tear 07/19/2015  . Mass of mandible 09/02/2015  . Right-sided chest pain 05/18/2016  . Radicular syndrome of right leg 05/18/2016  . Pulsatile tinnitus of left ear 05/18/2016  . DUB (dysfunctional uterine bleeding) 05/21/2016  . Pernicious anemia 05/22/2016  . Vitamin D insufficiency 05/22/2016  . SOB (shortness of breath) 12/24/2016  . Palpitations 12/24/2016  . Vitamin B12 deficiency 12/24/2016  . Right ankle swelling 12/24/2016  . Family history of CHF (congestive heart failure) 12/24/2016  . Breast calcification, left 07/26/2017  . Atypical ductal hyperplasia of left breast 08/05/2017  . PVC's (premature ventricular contractions) 10/27/2017  . Right peroneal tendinosis 02/26/2018  . Hand eczema 05/16/2018  . Pain of right thumb 05/16/2018  . Pain of right heel 05/16/2018  . Multiple joint pain 05/16/2018  . Lymphadenopathy 05/16/2018  . BPPV (benign paroxysmal positional vertigo), left 12/12/2018  . Bone pain 12/12/2018  . Chronic right  shoulder pain 01/06/2020  . Cough 01/06/2020  . Environmental allergies 01/06/2020  . Seasonal allergies 01/06/2020  . Wheezing 11/21/2020   Resolved Ambulatory Problems    Diagnosis Date Noted  . Right leg pain 02/09/2015  . Left knee injury 07/08/2015  . RUQ pain 09/02/2015   Past Medical History:  Diagnosis Date  . ACL injury tear   . Anxiety   . Fatty liver   . IBS (irritable bowel syndrome)   . TB (tuberculosis), treated         Review of Systems  All other systems reviewed and are negative.      Objective:   Physical Exam Vitals reviewed.  Constitutional:      Appearance: Normal appearance.  HENT:     Right Ear: Tympanic membrane normal.     Left Ear: Tympanic membrane normal.     Nose: Nose normal.     Mouth/Throat:     Mouth: Mucous membranes are moist.  Eyes:     Conjunctiva/sclera: Conjunctivae normal.     Pupils: Pupils are equal, round, and reactive to light.  Cardiovascular:     Rate and Rhythm: Normal rate and regular rhythm.     Pulses: Normal pulses.     Heart sounds: Normal heart sounds.  Pulmonary:     Effort: Pulmonary effort is normal.     Breath sounds: Normal breath sounds.  Musculoskeletal:     Right lower leg: No edema.     Left lower leg: No edema.  Neurological:     General: No focal deficit present.  Mental Status: She is alert and oriented to person, place, and time.  Psychiatric:        Mood and Affect: Mood normal.        Behavior: Behavior normal.           Assessment & Plan:  Marland KitchenMarland KitchenAliese was seen today for annual exam.  Diagnoses and all orders for this visit:  GAD (generalized anxiety disorder) -     LORazepam (ATIVAN) 0.5 MG tablet; Take 1 tablet (0.5 mg total) by mouth every 8 (eight) hours as needed. for anxiety  Wheezing -     montelukast (SINGULAIR) 10 MG tablet; Take 1 tablet (10 mg total) by mouth at bedtime.  Cough -     montelukast (SINGULAIR) 10 MG tablet; Take 1 tablet (10 mg total) by mouth at  bedtime. -     albuterol (PROAIR HFA) 108 (90 Base) MCG/ACT inhaler; Inhale 2 puffs into the lungs every 6 (six) hours as needed for wheezing or shortness of breath.  Environmental allergies -     montelukast (SINGULAIR) 10 MG tablet; Take 1 tablet (10 mg total) by mouth at bedtime. -     albuterol (PROAIR HFA) 108 (90 Base) MCG/ACT inhaler; Inhale 2 puffs into the lungs every 6 (six) hours as needed for wheezing or shortness of breath.  Seasonal allergies -     montelukast (SINGULAIR) 10 MG tablet; Take 1 tablet (10 mg total) by mouth at bedtime. -     albuterol (PROAIR HFA) 108 (90 Base) MCG/ACT inhaler; Inhale 2 puffs into the lungs every 6 (six) hours as needed for wheezing or shortness of breath.  Vitamin B12 deficiency -     cyanocobalamin (,VITAMIN B-12,) 1000 MCG/ML injection; ADMINISTER 1 ML(1000 MCG) IN THE MUSCLE EVERY 30 DAYS   Refilled medications.   Added singulair to anti-histamine and rescue inhaler to see if could replace the flovent. If using albuterol daily or regularly then need to get formal lung testing and we can try other daily ICS inhalers.   b12 checked within last year. Refilled b12. Discussed this is an IM medication and not intended for [place] use. Give in lateral thigh.   Ativan refilled. No concerns. Taking a few times a month. Continue counseling.

## 2020-11-21 ENCOUNTER — Encounter: Payer: Self-pay | Admitting: Physician Assistant

## 2020-11-21 DIAGNOSIS — R062 Wheezing: Secondary | ICD-10-CM | POA: Insufficient documentation

## 2020-11-23 DIAGNOSIS — R922 Inconclusive mammogram: Secondary | ICD-10-CM | POA: Diagnosis not present

## 2020-11-23 DIAGNOSIS — N6089 Other benign mammary dysplasias of unspecified breast: Secondary | ICD-10-CM | POA: Diagnosis not present

## 2020-11-23 LAB — HM MAMMOGRAPHY

## 2020-11-28 ENCOUNTER — Encounter: Payer: Self-pay | Admitting: Physician Assistant

## 2020-11-28 NOTE — Telephone Encounter (Signed)
Appt with Dr T has been scheduled. AM

## 2020-11-30 ENCOUNTER — Ambulatory Visit (INDEPENDENT_AMBULATORY_CARE_PROVIDER_SITE_OTHER): Payer: BC Managed Care – PPO

## 2020-11-30 ENCOUNTER — Ambulatory Visit (INDEPENDENT_AMBULATORY_CARE_PROVIDER_SITE_OTHER): Payer: BC Managed Care – PPO | Admitting: Sports Medicine

## 2020-11-30 ENCOUNTER — Encounter: Payer: Self-pay | Admitting: Sports Medicine

## 2020-11-30 ENCOUNTER — Other Ambulatory Visit: Payer: Self-pay

## 2020-11-30 DIAGNOSIS — M542 Cervicalgia: Secondary | ICD-10-CM | POA: Diagnosis not present

## 2020-11-30 DIAGNOSIS — M5412 Radiculopathy, cervical region: Secondary | ICD-10-CM

## 2020-11-30 MED ORDER — DEXAMETHASONE 4 MG PO TABS: 4.0000 mg | ORAL_TABLET | Freq: Three times a day (TID) | ORAL | 0 refills | Status: DC

## 2020-11-30 MED ORDER — PREDNISONE 50 MG PO TABS: ORAL_TABLET | ORAL | 0 refills | Status: DC

## 2020-11-30 NOTE — Assessment & Plan Note (Addendum)
This is a pleasant 50 year old female, she has been having neck pain with radiation down the right arm in a C7 distribution. No red flag symptoms. This typically occurs after doing heavy work in the yard or with prolonged downgaze, this is an exacerbation of a chronic process. We will start conservatively with prednisone, formal PT, x-rays. Return to see me in 4 to 6 weeks, MRI for interventional planning if no better.  Update: We will be switching to Decadron per patient request.

## 2020-11-30 NOTE — Progress Notes (Addendum)
    Procedures performed today:    None.  Independent interpretation of notes and tests performed by another provider:   None.  Brief History, Exam, Impression, and Recommendations:    Radiculitis of right cervical region This is a pleasant 50 year old female, she has been having neck pain with radiation down the right arm in a C7 distribution. No red flag symptoms. This typically occurs after doing heavy work in the yard or with prolonged downgaze, this is an exacerbation of a chronic process. We will start conservatively with prednisone, formal PT, x-rays. Return to see me in 4 to 6 weeks, MRI for interventional planning if no better.  Update: We will be switching to Decadron per patient request.    ___________________________________________ Gwen Her. Dianah Field, M.D., ABFM., CAQSM. Primary Care and [place] [employer] of [employer] [facility]

## 2020-11-30 NOTE — Addendum Note (Signed)
Addended by: Silverio Decamp on: 11/30/2020 10:18 AM   Modules accepted: Orders

## 2020-12-07 ENCOUNTER — Other Ambulatory Visit: Payer: Self-pay

## 2020-12-07 ENCOUNTER — Encounter: Payer: Self-pay | Admitting: Physical Therapy

## 2020-12-07 ENCOUNTER — Ambulatory Visit (INDEPENDENT_AMBULATORY_CARE_PROVIDER_SITE_OTHER): Payer: BC Managed Care – PPO | Admitting: Physical Therapy

## 2020-12-07 DIAGNOSIS — M542 Cervicalgia: Secondary | ICD-10-CM

## 2020-12-07 DIAGNOSIS — M6281 Muscle weakness (generalized): Secondary | ICD-10-CM | POA: Diagnosis not present

## 2020-12-07 DIAGNOSIS — R293 Abnormal posture: Secondary | ICD-10-CM

## 2020-12-07 NOTE — Patient Instructions (Signed)
Access Code: [email or web address] URL: [email or web address] Date: 12/07/2020 Prepared by: Isabelle Course  Exercises Seated Cervical Retraction - 1 x daily - 7 x weekly - 1 sets - 10 reps - 5 seconds hold Standing Shoulder Horizontal Abduction with Resistance - 1 x daily - 7 x weekly - 3 sets - 10 reps Shoulder External Rotation and Scapular Retraction with Resistance - 1 x daily - 7 x weekly - 3 sets - 10 reps Standing Bilateral Low Shoulder Row with Anchored Resistance - 1 x daily - 7 x weekly - 3 sets - 10 reps  Patient Education Trigger Point Dry Needling
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
## 2020-12-09 DIAGNOSIS — R9389 Abnormal findings on diagnostic imaging of other specified body structures: Secondary | ICD-10-CM | POA: Diagnosis not present

## 2020-12-09 LAB — HM MAMMOGRAPHY

## 2020-12-13 ENCOUNTER — Ambulatory Visit (INDEPENDENT_AMBULATORY_CARE_PROVIDER_SITE_OTHER): Payer: BC Managed Care – PPO | Admitting: Physical Therapy

## 2020-12-13 ENCOUNTER — Other Ambulatory Visit: Payer: Self-pay

## 2020-12-13 DIAGNOSIS — R293 Abnormal posture: Secondary | ICD-10-CM | POA: Diagnosis not present

## 2020-12-13 DIAGNOSIS — M6281 Muscle weakness (generalized): Secondary | ICD-10-CM | POA: Diagnosis not present

## 2020-12-13 DIAGNOSIS — M542 Cervicalgia: Secondary | ICD-10-CM | POA: Diagnosis not present

## 2020-12-13 NOTE — Therapy (Signed)
[place], [place], [address] Phone: [phone]   Fax:  [phone]  Physical Therapy Treatment  Patient Details  Name: Joanna Baker MRN: [id number] Date of Birth: [birth year] Referring Provider (PT): thekkekandam   Encounter Date: 12/13/2020   PT End of Session - 12/13/20 1053    Visit Number 2    Number of Visits 12    Date for PT Re-Evaluation 01/18/21    PT Start Time 1016    PT Stop Time 1048   pt had to leave early for appt   PT Time Calculation (min) 32 min    Activity Tolerance Patient tolerated treatment well    Behavior During Therapy [facility] for tasks assessed/performed           Past Medical History:  Diagnosis Date  . ACL injury tear    Right knee, not repaired.    . Anxiety   . Depression   . Fatty liver   . IBS (irritable bowel syndrome)   . TB (tuberculosis), treated     6 month of INH at age 30    Past Surgical History:  Procedure Laterality Date  . TONSILLECTOMY  1993    There were no vitals filed for this visit.   Subjective Assessment - 12/13/20 1020    Subjective Pt states she had some pain in her Rt shoulder while sitting at work yesterday, feels better today. Has a massage scheduled after this visit    Patient Stated Goals reduce pain to return to work and recreational activities with decreased pain    Currently in Pain? No/denies                             [facility] Adult PT Treatment/Exercise - 12/13/20 0001      Neck Exercises: Machines for Strengthening   UBE (Upper Arm Bike) level 3 4 min alt fwd/bkwd      Neck Exercises: Theraband   Rows 20 reps;Red    Horizontal ABduction 20 reps;Red    Other Theraband Exercises Ws red TB x 10      Neck Exercises: Supine   Neck Retraction 5 reps;5 secs      Manual Therapy   Manual therapy comments skilled palpation to assess effects of dry needline    Soft tissue mobilization upper trap and  levator Rt            Trigger Point Dry Needling - 12/13/20 0001    Consent Given? Yes    Education Handout Provided Yes    Muscles Treated Head and Neck Upper trapezius;Levator scapulae    Upper Trapezius Response Twitch reponse elicited    Levator Scapulae Response Twitch response elicited                     PT Long Term Goals - 12/07/20 1200      PT LONG TERM GOAL #1   Title Pt will be independent in HEP    Time 6    Period Weeks    Status New    Target Date 01/18/21      PT LONG TERM GOAL #2   Title Pt will improve Rt UE strength to 4+/5 to return to gardening with decreased pain    Time 6    Period Weeks    Status New    Target Date 01/18/21      PT LONG TERM GOAL #  3   Title Pt will improve FOTO to >= 70 to demo improved functional mobility    Time 6    Period Weeks    Status New    Target Date 01/18/21      PT LONG TERM GOAL #4   Title Pt will improve cervical ROM of Rt side to = Lt side to perform work duties with decreased pain    Time 6    Period Weeks    Status New    Target Date 01/18/21      PT LONG TERM GOAL #5   Title Pt will return to yard work with pain <= 2/10    Time 6    Period Weeks    Status New    Target Date 01/18/21                 Plan - 12/13/20 1053    Clinical Impression Statement Pt with multiple trigger points throughout Rt upper traps and levator, responds well to dry needling with multiple twitch responses. Pt with good form for theraband exercises but fatigues after 15-20 reps.  Pt had to leave session early for massage appointment.    PT Next Visit Plan progress postural strengthening, stretching    PT Home Exercise Plan [email or web address]    Consulted and Agree with Plan of Care Patient           Patient will benefit from skilled therapeutic intervention in order to improve the following deficits and impairments:     Visit Diagnosis: Muscle weakness (generalized)  Neck pain  Abnormal  posture     Problem List Patient Active Problem List   Diagnosis Date Noted  . Radiculitis of right cervical region 11/30/2020  . Wheezing 11/21/2020  . Chronic right shoulder pain 01/06/2020  . Cough 01/06/2020  . Environmental allergies 01/06/2020  . Seasonal allergies 01/06/2020  . BPPV (benign paroxysmal positional vertigo), left 12/12/2018  . Bone pain 12/12/2018  . Hand eczema 05/16/2018  . Pain of right thumb 05/16/2018  . Pain of right heel 05/16/2018  . Multiple joint pain 05/16/2018  . Lymphadenopathy 05/16/2018  . Right peroneal tendinosis 02/26/2018  . PVC's (premature ventricular contractions) 10/27/2017  . Atypical ductal hyperplasia of left breast 08/05/2017  . Breast calcification, left 07/26/2017  . SOB (shortness of breath) 12/24/2016  . Palpitations 12/24/2016  . Vitamin B12 deficiency 12/24/2016  . Right ankle swelling 12/24/2016  . Family history of CHF (congestive heart failure) 12/24/2016  . Pernicious anemia 05/22/2016  . Vitamin D insufficiency 05/22/2016  . DUB (dysfunctional uterine bleeding) 05/21/2016  . Right-sided chest pain 05/18/2016  . Radicular syndrome of right leg 05/18/2016  . Pulsatile tinnitus of left ear 05/18/2016  . Mass of mandible 09/02/2015  . ACL tear 07/19/2015  . Premenstrual symptom 11/07/2014  . Depression 12/11/2013  . GAD (generalized anxiety disorder) 12/11/2013  . Fatty liver disease, nonalcoholic 92/05/9416  . Right lumbar radiculitis 01/13/2013  . Hemorrhoid 08/03/2011   Deontrey Massi, PT  [place] Nies 12/13/2020, 10:55 AM  [facility] [place], [place], [address] Phone: [phone]   Fax:  [phone]  Name: Joanna Baker MRN: [id number] Date of Birth: 1971/02/22

## 2020-12-14 ENCOUNTER — Encounter: Payer: BC Managed Care – PPO | Admitting: Physical Therapy

## 2020-12-15 ENCOUNTER — Encounter: Payer: Self-pay | Admitting: Physician Assistant

## 2020-12-20 ENCOUNTER — Other Ambulatory Visit: Payer: Self-pay

## 2020-12-20 ENCOUNTER — Ambulatory Visit (INDEPENDENT_AMBULATORY_CARE_PROVIDER_SITE_OTHER): Payer: BC Managed Care – PPO | Admitting: Physical Therapy

## 2020-12-20 DIAGNOSIS — M6281 Muscle weakness (generalized): Secondary | ICD-10-CM | POA: Diagnosis not present

## 2020-12-20 DIAGNOSIS — R293 Abnormal posture: Secondary | ICD-10-CM

## 2020-12-20 DIAGNOSIS — M542 Cervicalgia: Secondary | ICD-10-CM | POA: Diagnosis not present

## 2020-12-20 NOTE — Therapy (Signed)
[place], [place], [address] Phone: [phone]   Fax:  [phone]  Physical Therapy Treatment  Patient Details  Name: Joanna Baker MRN: [id number] Date of Birth: 1970-07-25 Referring Provider (PT): thekkekandam   Encounter Date: 12/20/2020   PT End of Session - 12/20/20 1056    Visit Number 3    Number of Visits 12    Date for PT Re-Evaluation 01/18/21    PT Start Time 1016    PT Stop Time 1056    PT Time Calculation (min) 40 min    Activity Tolerance Patient tolerated treatment well    Behavior During Therapy [facility] for tasks assessed/performed           Past Medical History:  Diagnosis Date  . ACL injury tear    Right knee, not repaired.    . Anxiety   . Depression   . Fatty liver   . IBS (irritable bowel syndrome)   . TB (tuberculosis), treated     6 month of INH at age 18    Past Surgical History:  Procedure Laterality Date  . TONSILLECTOMY  1993    There were no vitals filed for this visit.   Subjective Assessment - 12/20/20 1018    Subjective Pt states the dry needling felt good. States she still is unable to sleep on her left side due to pain and cannot wear bras that have a tight strap due to neck/shoulder pain and discomfort    Currently in Pain? No/denies              [facility] PT Assessment - 12/20/20 0001      Assessment   Medical Diagnosis Rt cervical radiculopathy    Referring Provider (PT) thekkekandam    Hand Dominance Right      Strength   Right Shoulder Flexion 4/5                         [facility] Adult PT Treatment/Exercise - 12/20/20 0001      Neck Exercises: Machines for Strengthening   UBE (Upper Arm Bike) level 3 x 4 min alt fwd/bkwd      Neck Exercises: Theraband   Rows Red   30 reps     Neck Exercises: Standing   Upper Extremity D2 15 reps;Flexion    Theraband Level (UE D2) Level 2 (Red)    Thumb Tacks serratus wall slide with  foam roll x 10, serratus clock red TB x 5 bilat      Neck Exercises: Supine   Neck Retraction 5 reps;5 secs      Manual Therapy   Manual therapy comments skilled palpation to assess effects of dry needline    Soft tissue mobilization upper trap and levator bilat            Trigger Point Dry Needling - 12/20/20 0001    Consent Given? Yes    Education Handout Provided Previously provided    Upper Trapezius Response Twitch reponse elicited    Levator Scapulae Response Twitch response elicited                     PT Long Term Goals - 12/07/20 1200      PT LONG TERM GOAL #1   Title Pt will be independent in HEP    Time 6    Period Weeks    Status New    Target Date 01/18/21  PT LONG TERM GOAL #2   Title Pt will improve Rt UE strength to 4+/5 to return to gardening with decreased pain    Time 6    Period Weeks    Status New    Target Date 01/18/21      PT LONG TERM GOAL #3   Title Pt will improve FOTO to >= 70 to demo improved functional mobility    Time 6    Period Weeks    Status New    Target Date 01/18/21      PT LONG TERM GOAL #4   Title Pt will improve cervical ROM of Rt side to = Lt side to perform work duties with decreased pain    Time 6    Period Weeks    Status New    Target Date 01/18/21      PT LONG TERM GOAL #5   Title Pt will return to yard work with pain <= 2/10    Time 6    Period Weeks    Status New    Target Date 01/18/21                 Plan - 12/20/20 1056    Clinical Impression Statement Pt continues with multiple trigger points throughout bilat upper traps which respond well to dry needling and manual therapy. Pt with good tolerance to progression of postural exercises, requires rest during serratus activation activities    PT Next Visit Plan doorway stretch, postural strength, manual and modalities as appropriate    PT Home Exercise Plan [email or web address]    Consulted and Agree with Plan of Care Patient            Patient will benefit from skilled therapeutic intervention in order to improve the following deficits and impairments:     Visit Diagnosis: Muscle weakness (generalized)  Neck pain  Abnormal posture     Problem List Patient Active Problem List   Diagnosis Date Noted  . Radiculitis of right cervical region 11/30/2020  . Wheezing 11/21/2020  . Chronic right shoulder pain 01/06/2020  . Cough 01/06/2020  . Environmental allergies 01/06/2020  . Seasonal allergies 01/06/2020  . BPPV (benign paroxysmal positional vertigo), left 12/12/2018  . Bone pain 12/12/2018  . Hand eczema 05/16/2018  . Pain of right thumb 05/16/2018  . Pain of right heel 05/16/2018  . Multiple joint pain 05/16/2018  . Lymphadenopathy 05/16/2018  . Right peroneal tendinosis 02/26/2018  . PVC's (premature ventricular contractions) 10/27/2017  . Atypical ductal hyperplasia of left breast 08/05/2017  . Breast calcification, left 07/26/2017  . SOB (shortness of breath) 12/24/2016  . Palpitations 12/24/2016  . Vitamin B12 deficiency 12/24/2016  . Right ankle swelling 12/24/2016  . Family history of CHF (congestive heart failure) 12/24/2016  . Pernicious anemia 05/22/2016  . Vitamin D insufficiency 05/22/2016  . DUB (dysfunctional uterine bleeding) 05/21/2016  . Right-sided chest pain 05/18/2016  . Radicular syndrome of right leg 05/18/2016  . Pulsatile tinnitus of left ear 05/18/2016  . Mass of mandible 09/02/2015  . ACL tear 07/19/2015  . Premenstrual symptom 11/07/2014  . Depression 12/11/2013  . GAD (generalized anxiety disorder) 12/11/2013  . Fatty liver disease, nonalcoholic 54/03/8118  . Right lumbar radiculitis 01/13/2013  . Hemorrhoid 08/03/2011   Joanna Baker, PT  Joanna Baker 12/20/2020, 10:58 AM  [facility] [place], [place], [address] Phone: [phone]   Fax:  [phone]  Name: Joanna Baker MRN:  [id number] Date of  Birth: November 11, 1970

## 2020-12-21 DIAGNOSIS — F4321 Adjustment disorder with depressed mood: Secondary | ICD-10-CM | POA: Diagnosis not present

## 2020-12-21 DIAGNOSIS — F4323 Adjustment disorder with mixed anxiety and depressed mood: Secondary | ICD-10-CM | POA: Diagnosis not present

## 2020-12-23 ENCOUNTER — Other Ambulatory Visit: Payer: Self-pay

## 2020-12-23 ENCOUNTER — Ambulatory Visit (INDEPENDENT_AMBULATORY_CARE_PROVIDER_SITE_OTHER): Payer: BC Managed Care – PPO | Admitting: Physical Therapy

## 2020-12-23 DIAGNOSIS — M6281 Muscle weakness (generalized): Secondary | ICD-10-CM | POA: Diagnosis not present

## 2020-12-23 DIAGNOSIS — M542 Cervicalgia: Secondary | ICD-10-CM | POA: Diagnosis not present

## 2020-12-23 DIAGNOSIS — R293 Abnormal posture: Secondary | ICD-10-CM | POA: Diagnosis not present
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
## 2020-12-27 ENCOUNTER — Ambulatory Visit (INDEPENDENT_AMBULATORY_CARE_PROVIDER_SITE_OTHER): Payer: BC Managed Care – PPO | Admitting: Physical Therapy

## 2020-12-27 ENCOUNTER — Other Ambulatory Visit: Payer: Self-pay

## 2020-12-27 DIAGNOSIS — M542 Cervicalgia: Secondary | ICD-10-CM | POA: Diagnosis not present

## 2020-12-27 DIAGNOSIS — R293 Abnormal posture: Secondary | ICD-10-CM | POA: Diagnosis not present

## 2020-12-27 DIAGNOSIS — M6281 Muscle weakness (generalized): Secondary | ICD-10-CM

## 2020-12-27 NOTE — Therapy (Signed)
[place], [place], [address] Phone: [phone]   Fax:  [phone]  Physical Therapy Treatment  Patient Details  Name: Joanna Baker MRN: [id number] Date of Birth: April 15, 1971 Referring Provider (PT): Thekkekandam   Encounter Date: 12/27/2020   PT End of Session - 12/27/20 1150     Visit Number 5    Number of Visits 12    Date for PT Re-Evaluation 01/18/21    PT Start Time 1151    PT Stop Time 1230    PT Time Calculation (min) 39 min    Activity Tolerance Patient tolerated treatment well    Behavior During Therapy [facility] for tasks assessed/performed             Past Medical History:  Diagnosis Date   ACL injury tear    Right knee, not repaired.     Anxiety    Depression    Fatty liver    IBS (irritable bowel syndrome)    TB (tuberculosis), treated     6 month of INH at age 31    Past Surgical History:  Procedure Laterality Date   TONSILLECTOMY  1993    There were no vitals filed for this visit.   Subjective Assessment - 12/27/20 1151     Subjective had deep tissue massage this morning and felt a pop and now just sore.    Patient Stated Goals reduce pain to return to work and recreational activities with decreased pain    Currently in Pain? No/denies                               [facility] Adult PT Treatment/Exercise - 12/27/20 0001       Neck Exercises: Machines for Strengthening   UBE (Upper Arm Bike) level 3 x 4 min alt fwd/bkwd      Neck Exercises: Standing   Thumb Tacks serratus push x 10    Other Standing Exercises serratus wall slide with foam roll x 10, serratus clock red TB x 5 bilat    Other Standing Exercises bent over row 5# x 10 right   difficult     Neck Exercises: Prone   Neck Retraction 5 reps;5 secs   then with ball which increased sx into arm     Neck Exercises: Stabilization   Stabilization prone off table 5 sec hold x 2; with  retraction x 3 causes rad sx; prone on elbows retraction x 5 with 5 sec hold; attempted shoulder reach but difficult      Neck Exercises: Stretches   Other Neck Stretches ER stretch bil in doorway 2x30 sec ea    Other Neck Stretches also did pec stretch on large green noodle                         PT Long Term Goals - 12/07/20 1200       PT LONG TERM GOAL #1   Title Pt will be independent in HEP    Time 6    Period Weeks    Status New    Target Date 01/18/21      PT LONG TERM GOAL #2   Title Pt will improve Rt UE strength to 4+/5 to return to gardening with decreased pain    Time 6    Period Weeks    Status New    Target Date 01/18/21  PT LONG TERM GOAL #3   Title Pt will improve FOTO to >= 70 to demo improved functional mobility    Time 6    Period Weeks    Status New    Target Date 01/18/21      PT LONG TERM GOAL #4   Title Pt will improve cervical ROM of Rt side to = Lt side to perform work duties with decreased pain    Time 6    Period Weeks    Status New    Target Date 01/18/21      PT LONG TERM GOAL #5   Title Pt will return to yard work with pain <= 2/10    Time 6    Period Weeks    Status New    Target Date 01/18/21                   Plan - 12/27/20 1240     Clinical Impression Statement Patient reporting decreased sx following massage this morning. She was sore from the massage mainly in the low back. Cervical retraction with resistance increases radicular sx, although she was able to do with no resistance. Pectorals still tight and she would benefit from manual and/or DN here.    PT Frequency 2x / week    PT Duration 6 weeks    PT Treatment/Interventions Cryotherapy;Traction;Moist Heat;Iontophoresis 4mg /ml Dexamethasone;Electrical Stimulation;Neuromuscular re-education;Therapeutic exercise;Therapeutic activities;Patient/family education;Manual techniques;Passive range of motion;Dry needling;Taping    PT Next Visit Plan  postural strength, manual/DN and modalities as appropriate. assess response to tape.             Patient will benefit from skilled therapeutic intervention in order to improve the following deficits and impairments:  Pain, Postural dysfunction, Decreased strength, Decreased range of motion, Hypomobility  Visit Diagnosis: Muscle weakness (generalized)  Neck pain  Abnormal posture     Problem List Patient Active Problem List   Diagnosis Date Noted   Radiculitis of right cervical region 11/30/2020   Wheezing 11/21/2020   Chronic right shoulder pain 01/06/2020   Cough 01/06/2020   Environmental allergies 01/06/2020   Seasonal allergies 01/06/2020   BPPV (benign paroxysmal positional vertigo), left 12/12/2018   Bone pain 12/12/2018   Hand eczema 05/16/2018   Pain of right thumb 05/16/2018   Pain of right heel 05/16/2018   Multiple joint pain 05/16/2018   Lymphadenopathy 05/16/2018   Right peroneal tendinosis 02/26/2018   PVC's (premature ventricular contractions) 10/27/2017   Atypical ductal hyperplasia of left breast 08/05/2017   Breast calcification, left 07/26/2017   SOB (shortness of breath) 12/24/2016   Palpitations 12/24/2016   Vitamin B12 deficiency 12/24/2016   Right ankle swelling 12/24/2016   Family history of CHF (congestive heart failure) 12/24/2016   Pernicious anemia 05/22/2016   Vitamin D insufficiency 05/22/2016   DUB (dysfunctional uterine bleeding) 05/21/2016   Right-sided chest pain 05/18/2016   Radicular syndrome of right leg 05/18/2016   Pulsatile tinnitus of left ear 05/18/2016   Mass of mandible 09/02/2015   ACL tear 07/19/2015   Premenstrual symptom 11/07/2014   Depression 12/11/2013   GAD (generalized anxiety disorder) 12/11/2013   Fatty liver disease, nonalcoholic 35/67/0141   Right lumbar radiculitis 01/13/2013   Hemorrhoid 08/03/2011   Madelyn Flavors PT 12/27/2020, 12:43 PM  [place] [address] [place], [place], [address] Phone: [phone]   Fax:  [phone]  Name: Joanna Baker MRN: [id number] Date of Birth: 04/09/1971

## 2020-12-30 ENCOUNTER — Other Ambulatory Visit: Payer: Self-pay

## 2020-12-30 ENCOUNTER — Ambulatory Visit (INDEPENDENT_AMBULATORY_CARE_PROVIDER_SITE_OTHER): Payer: BC Managed Care – PPO | Admitting: Physical Therapy

## 2020-12-30 DIAGNOSIS — R293 Abnormal posture: Secondary | ICD-10-CM

## 2020-12-30 DIAGNOSIS — M6281 Muscle weakness (generalized): Secondary | ICD-10-CM

## 2020-12-30 DIAGNOSIS — M542 Cervicalgia: Secondary | ICD-10-CM | POA: Diagnosis not present

## 2020-12-30 NOTE — Therapy (Signed)
[place], [place], [address] Phone: [phone]   Fax:  [phone]  Physical Therapy Treatment  Patient Details  Name: Joanna Baker MRN: [id number] Date of Birth: 1971/01/01 Referring Provider (PT): Thekkekandam   Encounter Date: 12/30/2020   PT End of Session - 12/30/20 3903     Visit Number 6    Number of Visits 12    Date for PT Re-Evaluation 01/18/21    PT Start Time 1320    PT Stop Time 1400    PT Time Calculation (min) 40 min    Activity Tolerance Patient tolerated treatment well    Behavior During Therapy [facility] for tasks assessed/performed             Past Medical History:  Diagnosis Date   ACL injury tear    Right knee, not repaired.     Anxiety    Depression    Fatty liver    IBS (irritable bowel syndrome)    TB (tuberculosis), treated     6 month of INH at age 72    Past Surgical History:  Procedure Laterality Date   TONSILLECTOMY  1993    There were no vitals filed for this visit.   Subjective Assessment - 12/30/20 1320     Subjective Pt state she was more tight this morning so she is wearing a pain patch    Patient Stated Goals reduce pain to return to work and recreational activities with decreased pain    Currently in Pain? Yes    Pain Score 4     Pain Location Neck    Pain Orientation Right    Pain Descriptors / Indicators Dull    Pain Type Chronic pain                               [facility] Adult PT Treatment/Exercise - 12/30/20 0001       Neck Exercises: Machines for Strengthening   UBE (Upper Arm Bike) level 3 x 4 min alt fwd/bkwd      Neck Exercises: Standing   Other Standing Exercises serratus wall slide with foam roll x 10, serratus clock red TB x 5 bilat      Neck Exercises: Prone   Neck Retraction 1 rep;10 secs    Shoulder Extension 5 reps    Rows 5 reps      Neck Exercises: Stabilization   Stabilization prone shoulder  reach "y" x 5 bilat      Manual Therapy   Manual therapy comments Rock tape I strip for upper trap and levator and middle bicep    Soft tissue mobilization Rt wrist extensors and flexors, triceps, pecs      Neck Exercises: Stretches   Other Neck Stretches ER stretch in doorway                         PT Long Term Goals - 12/07/20 1200       PT LONG TERM GOAL #1   Title Pt will be independent in HEP    Time 6    Period Weeks    Status New    Target Date 01/18/21      PT LONG TERM GOAL #2   Title Pt will improve Rt UE strength to 4+/5 to return to gardening with decreased pain    Time 6    Period Weeks  Status New    Target Date 01/18/21      PT LONG TERM GOAL #3   Title Pt will improve FOTO to >= 70 to demo improved functional mobility    Time 6    Period Weeks    Status New    Target Date 01/18/21      PT LONG TERM GOAL #4   Title Pt will improve cervical ROM of Rt side to = Lt side to perform work duties with decreased pain    Time 6    Period Weeks    Status New    Target Date 01/18/21      PT LONG TERM GOAL #5   Title Pt will return to yard work with pain <= 2/10    Time 6    Period Weeks    Status New    Target Date 01/18/21                   Plan - 12/30/20 1442     Clinical Impression Statement Pt continues with increased mm spasticity in Rt upper traps and pecs. Continues with symtpoms of tingling when in a dependent position with Rt UE. Pt responds well to manual work and taping    PT Next Visit Plan postural strength, taping    PT Home Exercise Plan [id number]    OINOMVEHM and Agree with Plan of Care Patient             Patient will benefit from skilled therapeutic intervention in order to improve the following deficits and impairments:     Visit Diagnosis: Neck pain  Muscle weakness (generalized)  Abnormal posture     Problem List Patient Active Problem List   Diagnosis Date Noted   Radiculitis of right  cervical region 11/30/2020   Wheezing 11/21/2020   Chronic right shoulder pain 01/06/2020   Cough 01/06/2020   Environmental allergies 01/06/2020   Seasonal allergies 01/06/2020   BPPV (benign paroxysmal positional vertigo), left 12/12/2018   Bone pain 12/12/2018   Hand eczema 05/16/2018   Pain of right thumb 05/16/2018   Pain of right heel 05/16/2018   Multiple joint pain 05/16/2018   Lymphadenopathy 05/16/2018   Right peroneal tendinosis 02/26/2018   PVC's (premature ventricular contractions) 10/27/2017   Atypical ductal hyperplasia of left breast 08/05/2017   Breast calcification, left 07/26/2017   SOB (shortness of breath) 12/24/2016   Palpitations 12/24/2016   Vitamin B12 deficiency 12/24/2016   Right ankle swelling 12/24/2016   Family history of CHF (congestive heart failure) 12/24/2016   Pernicious anemia 05/22/2016   Vitamin D insufficiency 05/22/2016   DUB (dysfunctional uterine bleeding) 05/21/2016   Right-sided chest pain 05/18/2016   Radicular syndrome of right leg 05/18/2016   Pulsatile tinnitus of left ear 05/18/2016   Mass of mandible 09/02/2015   ACL tear 07/19/2015   Premenstrual symptom 11/07/2014   Depression 12/11/2013   GAD (generalized anxiety disorder) 12/11/2013   Fatty liver disease, nonalcoholic 09/47/0962   Right lumbar radiculitis 01/13/2013   Hemorrhoid 08/03/2011   Joanna Baker, PT  Joanna Baker 12/30/2020, 2:44 PM  [place] [address] [place], [place], [address] Phone: [phone]   Fax:  [phone]  Name: Joanna Baker MRN: [id number] Date of Birth: 09-05-1970

## 2021-01-03 ENCOUNTER — Encounter: Payer: BC Managed Care – PPO | Admitting: Physical Therapy

## 2021-01-06 ENCOUNTER — Ambulatory Visit (INDEPENDENT_AMBULATORY_CARE_PROVIDER_SITE_OTHER): Payer: BC Managed Care – PPO | Admitting: Physical Therapy

## 2021-01-06 ENCOUNTER — Other Ambulatory Visit: Payer: Self-pay

## 2021-01-06 DIAGNOSIS — M542 Cervicalgia: Secondary | ICD-10-CM

## 2021-01-06 DIAGNOSIS — R293 Abnormal posture: Secondary | ICD-10-CM | POA: Diagnosis not present

## 2021-01-06 DIAGNOSIS — M6281 Muscle weakness (generalized): Secondary | ICD-10-CM | POA: Diagnosis not present

## 2021-01-06 NOTE — Therapy (Signed)
[place] [address] [place], [place], [address] Phone: [phone]   Fax:  [phone]  Physical Therapy Treatment and Discharge  Patient Details  Name: Joanna Baker MRN: [id number] Date of Birth: Sep 26, 1970 Referring Provider (PT): Dianah Field   Encounter Date: 01/06/2021   PT End of Session - 01/06/21 1555     Visit Number 7    Number of Visits 12    Date for PT Re-Evaluation 01/18/21    PT Start Time 7858    PT Stop Time 1400    PT Time Calculation (min) 45 min    Activity Tolerance Patient tolerated treatment well    Behavior During Therapy [facility] for tasks assessed/performed             Past Medical History:  Diagnosis Date   ACL injury tear    Right knee, not repaired.     Anxiety    Depression    Fatty liver    IBS (irritable bowel syndrome)    TB (tuberculosis), treated     6 month of INH at age 29    Past Surgical History:  Procedure Laterality Date   TONSILLECTOMY  1993    There were no vitals filed for this visit.   Subjective Assessment - 01/06/21 1316     Subjective Pt states she has no pain, still has constant tingling    Patient Stated Goals reduce pain to return to work and recreational activities with decreased pain    Currently in Pain? No/denies                [facility] PT Assessment - 01/06/21 0001       Assessment   Medical Diagnosis Rt cervical radiculopathy    Referring Provider (PT) Thekkekandam    Hand Dominance Right      Observation/Other Assessments   Focus on Therapeutic Outcomes (FOTO)  69      AROM   Cervical Flexion 50    Cervical Extension 70    Cervical - Right Side Bend 45    Cervical - Left Side Bend 45    Cervical - Right Rotation 31    Cervical - Left Rotation 45      Strength   Right Shoulder Flexion 4/5    Right Shoulder ABduction 4+/5                           [facility] Adult PT Treatment/Exercise - 01/06/21 0001        Neck Exercises: Machines for Strengthening   UBE (Upper Arm Bike) level 3 x 4 min alt fwd/bkwd      Neck Exercises: Prone   Neck Retraction 1 rep;10 secs      Modalities   Modalities Traction      Traction   Type of Traction Cervical    Min (lbs) 8    Max (lbs) 15    Hold Time 60 sec    Rest Time 20 sec    Time 10 min      Manual Therapy   Soft tissue mobilization STM Rt upper trap, levator, cervical paraspinals      Neck Exercises: Stretches   Upper Trapezius Stretch 30 seconds;2 reps    Levator Stretch 2 reps;30 seconds              Trigger Point Dry Needling - 01/06/21 0001     Consent Given? Yes    Education Handout Provided  Previously provided    Upper Trapezius Response Twitch reponse elicited    Levator Scapulae Response Twitch response elicited                  PT Education - 01/06/21 1553     Education Details plan for d/c, info on home traction    Person(s) Educated Patient    Methods Explanation;Demonstration    Comprehension Verbalized understanding;Returned demonstration                 PT Long Term Goals - 01/06/21 1318       PT LONG TERM GOAL #1   Title Pt will be independent in HEP    Status Achieved      PT LONG TERM GOAL #2   Title Pt will improve Rt UE strength to 4+/5 to return to gardening with decreased pain    Status Partially Met      PT LONG TERM GOAL #4   Title Pt will improve cervical ROM of Rt side to = Lt side to perform work duties with decreased pain    Baseline met except for rotation    Status Partially Met      PT LONG TERM GOAL #5   Title Pt will return to yard work with pain <= 2/10    Status Achieved                   Plan - 01/06/21 1555     Clinical Impression Statement Session focused on manual therapy to decrease mm spasticity in Rt shoulder and cervical spine. Pt wtih good response to manual and mechanical traction. pt has reduced pain and improved mobility, continues with  numbness/tingling into Rt UE.    PT Next Visit Plan d/c    PT Home Exercise Plan [id number]    Consulted and Agree with Plan of Care Patient             Patient will benefit from skilled therapeutic intervention in order to improve the following deficits and impairments:     Visit Diagnosis: Neck pain  Muscle weakness (generalized)  Abnormal posture     Problem List Patient Active Problem List   Diagnosis Date Noted   Radiculitis of right cervical region 11/30/2020   Wheezing 11/21/2020   Chronic right shoulder pain 01/06/2020   Cough 01/06/2020   Environmental allergies 01/06/2020   Seasonal allergies 01/06/2020   BPPV (benign paroxysmal positional vertigo), left 12/12/2018   Bone pain 12/12/2018   Hand eczema 05/16/2018   Pain of right thumb 05/16/2018   Pain of right heel 05/16/2018   Multiple joint pain 05/16/2018   Lymphadenopathy 05/16/2018   Right peroneal tendinosis 02/26/2018   PVC's (premature ventricular contractions) 10/27/2017   Atypical ductal hyperplasia of left breast 08/05/2017   Breast calcification, left 07/26/2017   SOB (shortness of breath) 12/24/2016   Palpitations 12/24/2016   Vitamin B12 deficiency 12/24/2016   Right ankle swelling 12/24/2016   Family history of CHF (congestive heart failure) 12/24/2016   Pernicious anemia 05/22/2016   Vitamin D insufficiency 05/22/2016   DUB (dysfunctional uterine bleeding) 05/21/2016   Right-sided chest pain 05/18/2016   Radicular syndrome of right leg 05/18/2016   Pulsatile tinnitus of left ear 05/18/2016   Mass of mandible 09/02/2015   ACL tear 07/19/2015   Premenstrual symptom 11/07/2014   Depression 12/11/2013   GAD (generalized anxiety disorder) 12/11/2013   Fatty liver disease, nonalcoholic 11/02/2013   Right lumbar radiculitis 01/13/2013  Hemorrhoid 08/03/2011   PHYSICAL THERAPY DISCHARGE SUMMARY  Visits from Start of Care: 7  Current functional level related to goals / functional  outcomes: Pt has decreased pain and improved mobility   Remaining deficits: Numbness/tingling   Education / Equipment: HEP   Patient agrees to discharge. Patient goals were partially met. Patient is being discharged due to being pleased with the current functional level. Isabelle Course, PT  Jalaya Sarver 01/06/2021, 4:02 PM  [facility] [address] [place], [place], [address] Phone: [phone]   Fax:  [phone]  Name: Santa Abdelrahman MRN: [id number] Date of Birth: 1970-08-21

## 2021-01-11 ENCOUNTER — Ambulatory Visit: Payer: BC Managed Care – PPO | Admitting: Sports Medicine

## 2021-01-11 ENCOUNTER — Other Ambulatory Visit: Payer: Self-pay

## 2021-01-11 DIAGNOSIS — M5412 Radiculopathy, cervical region: Secondary | ICD-10-CM

## 2021-01-11 NOTE — Assessment & Plan Note (Signed)
Joanna Baker is a pleasant 50 year old female, we have been treating her for right C7 distribution radiculitis without red flag symptoms, we started with Decadron, formal physical therapy, unfortunately she did not get sufficient relief, her pain is much better but she still has significant paresthesias. She is averse to gabapentin and Lyrica. At this juncture due to failure of 6 weeks of conservative treatment and DDD on x-rays we are going to proceed with MRI for epidural planning, likely right C6-C7 interlaminar. She needs a work note as well. Return to see me to go over MRI results.

## 2021-01-11 NOTE — Progress Notes (Signed)
    Procedures performed today:    None.  Independent interpretation of notes and tests performed by another provider:   None.  Brief History, Exam, Impression, and Recommendations:    Radiculitis of right cervical region Joanna Baker is a pleasant 50 year old female, we have been treating her for right C7 distribution radiculitis without red flag symptoms, we started with Decadron, formal physical therapy, unfortunately she did not get sufficient relief, her pain is much better but she still has significant paresthesias. She is averse to gabapentin and Lyrica. At this juncture due to failure of 6 weeks of conservative treatment and DDD on x-rays we are going to proceed with MRI for epidural planning, likely right C6-C7 interlaminar. She needs a work note as well. Return to see me to go over MRI results.    ___________________________________________ Gwen Her. Dianah Field, M.D., ABFM., CAQSM. Primary Care and [place] Instructor of [place] [facility] of Medicine

## 2021-01-13 DIAGNOSIS — F4321 Adjustment disorder with depressed mood: Secondary | ICD-10-CM | POA: Diagnosis not present

## 2021-01-13 DIAGNOSIS — F4323 Adjustment disorder with mixed anxiety and depressed mood: Secondary | ICD-10-CM | POA: Diagnosis not present

## 2021-01-15 ENCOUNTER — Ambulatory Visit (INDEPENDENT_AMBULATORY_CARE_PROVIDER_SITE_OTHER): Payer: BC Managed Care – PPO

## 2021-01-15 ENCOUNTER — Other Ambulatory Visit: Payer: Self-pay

## 2021-01-15 DIAGNOSIS — M4802 Spinal stenosis, cervical region: Secondary | ICD-10-CM | POA: Diagnosis not present

## 2021-01-15 DIAGNOSIS — M50123 Cervical disc disorder at C6-C7 level with radiculopathy: Secondary | ICD-10-CM | POA: Diagnosis not present

## 2021-01-15 DIAGNOSIS — M4722 Other spondylosis with radiculopathy, cervical region: Secondary | ICD-10-CM | POA: Diagnosis not present

## 2021-01-15 DIAGNOSIS — M5011 Cervical disc disorder with radiculopathy,  high cervical region: Secondary | ICD-10-CM | POA: Diagnosis not present

## 2021-01-15 DIAGNOSIS — M5412 Radiculopathy, cervical region: Secondary | ICD-10-CM

## 2021-01-23 ENCOUNTER — Encounter: Payer: Self-pay | Admitting: Physician Assistant

## 2021-01-23 ENCOUNTER — Ambulatory Visit (INDEPENDENT_AMBULATORY_CARE_PROVIDER_SITE_OTHER): Payer: BC Managed Care – PPO | Admitting: Physician Assistant

## 2021-01-23 ENCOUNTER — Other Ambulatory Visit: Payer: Self-pay

## 2021-01-23 VITALS — BP 105/62 | HR 86 | Ht 67.0 in | Wt 207.0 lb

## 2021-01-23 DIAGNOSIS — R0789 Other chest pain: Secondary | ICD-10-CM | POA: Diagnosis not present

## 2021-01-23 DIAGNOSIS — Z0000 Encounter for general adult medical examination without abnormal findings: Secondary | ICD-10-CM | POA: Diagnosis not present

## 2021-01-23 DIAGNOSIS — Z13220 Encounter for screening for lipoid disorders: Secondary | ICD-10-CM

## 2021-01-23 DIAGNOSIS — E538 Deficiency of other specified B group vitamins: Secondary | ICD-10-CM

## 2021-01-23 DIAGNOSIS — Z1329 Encounter for screening for other suspected endocrine disorder: Secondary | ICD-10-CM

## 2021-01-23 DIAGNOSIS — E559 Vitamin D deficiency, unspecified: Secondary | ICD-10-CM

## 2021-01-23 DIAGNOSIS — G479 Sleep disorder, unspecified: Secondary | ICD-10-CM

## 2021-01-23 DIAGNOSIS — Z131 Encounter for screening for diabetes mellitus: Secondary | ICD-10-CM | POA: Diagnosis not present

## 2021-01-23 MED ORDER — TRAZODONE HCL 50 MG PO TABS
25.0000 mg | ORAL_TABLET | Freq: Every evening | ORAL | 3 refills | Status: DC | PRN
Start: 2021-01-23 — End: 2021-06-12

## 2021-01-23 NOTE — Patient Instructions (Addendum)
Try pepcid when your chest feels achy.  Will get stress test.   Health Maintenance, Female Adopting a healthy lifestyle and getting preventive care are important in promoting health and wellness. Ask your health care provider about: The right schedule for you to have regular tests and exams. Things you can do on your own to prevent diseases and keep yourself healthy. What should I know about diet, weight, and exercise? Eat a healthy diet  Eat a diet that includes plenty of vegetables, fruits, low-fat dairy products, and lean protein. Do not eat a lot of foods that are high in solid fats, added sugars, or sodium.  Maintain a healthy weight Body mass index (BMI) is used to identify weight problems. It estimates body fat based on height and weight. Your health care provider can help determineyour BMI and help you achieve or maintain a healthy weight. Get regular exercise Get regular exercise. This is one of the most important things you can do for your health. Most adults should: Exercise for at least 150 minutes each week. The exercise should increase your heart rate and make you sweat (moderate-intensity exercise). Do strengthening exercises at least twice a week. This is in addition to the moderate-intensity exercise. Spend less time sitting. Even light physical activity can be beneficial. Watch cholesterol and blood lipids Have your blood tested for lipids and cholesterol at 48 years of age, then havethis test every 5 years. Have your cholesterol levels checked more often if: Your lipid or cholesterol levels are high. You are older than 48 years of age. You are at high risk for heart disease. What should I know about cancer screening? Depending on your health history and family history, you may need to have cancer screening at various ages. This may include screening for: Breast cancer. Cervical cancer. Colorectal cancer. Skin cancer. Lung cancer. What should I know about heart  disease, diabetes, and high blood pressure? Blood pressure and heart disease High blood pressure causes heart disease and increases the risk of stroke. This is more likely to develop in people who have high blood pressure readings, are of African descent, or are overweight. Have your blood pressure checked: Every 3-5 years if you are 59-1 years of age. Every year if you are 25 years old or older. Diabetes Have regular diabetes screenings. This checks your fasting blood sugar level. Have the screening done: Once every three years after age 77 if you are at a normal weight and have a low risk for diabetes. More often and at a younger age if you are overweight or have a high risk for diabetes. What should I know about preventing infection? Hepatitis B If you have a higher risk for hepatitis B, you should be screened for this virus. Talk with your health care provider to find out if you are at risk forhepatitis B infection. Hepatitis C Testing is recommended for: Everyone born from [birth year] through 1963. Anyone with known risk factors for hepatitis C. Sexually transmitted infections (STIs) Get screened for STIs, including gonorrhea and chlamydia, if: You are sexually active and are younger than 48 years of age. You are older than 48 years of age and your health care provider tells you that you are at risk for this type of infection. Your sexual activity has changed since you were last screened, and you are at increased risk for chlamydia or gonorrhea. Ask your health care provider if you are at risk. Ask your health care provider about whether you are at high  risk for HIV. Your health care provider may recommend a prescription medicine to help prevent HIV infection. If you choose to take medicine to prevent HIV, you should first get tested for HIV. You should then be tested every 3 months for as long as you are taking the medicine. Pregnancy If you are about to stop having your period  (premenopausal) and you may become pregnant, seek counseling before you get pregnant. Take 400 to 800 micrograms (mcg) of folic acid every day if you become pregnant. Ask for birth control (contraception) if you want to prevent pregnancy. Osteoporosis and menopause Osteoporosis is a disease in which the bones lose minerals and strength with aging. This can result in bone fractures. If you are 87 years old or older, or if you are at risk for osteoporosis and fractures, ask your health care provider if you should: Be screened for bone loss. Take a calcium or vitamin D supplement to lower your risk of fractures. Be given hormone replacement therapy (HRT) to treat symptoms of menopause. Follow these instructions at home: Lifestyle Do not use any products that contain nicotine or tobacco, such as cigarettes, e-cigarettes, and chewing tobacco. If you need help quitting, ask your health care provider. Do not use street drugs. Do not share needles. Ask your health care provider for help if you need support or information about quitting drugs. Alcohol use Do not drink alcohol if: Your health care provider tells you not to drink. You are pregnant, may be pregnant, or are planning to become pregnant. If you drink alcohol: Limit how much you use to 0-1 drink a day. Limit intake if you are breastfeeding. Be aware of how much alcohol is in your drink. In the [place] one drink equals one 12 oz bottle of beer (355 mL), one 5 oz glass of wine (148 mL), or one 1 oz glass of hard liquor (44 mL). General instructions Schedule regular health, dental, and eye exams. Stay current with your vaccines. Tell your health care provider if: You often feel depressed. You have ever been abused or do not feel safe at home. Summary Adopting a healthy lifestyle and getting preventive care are important in promoting health and wellness. Follow your health care provider's instructions about healthy diet, exercising, and  getting tested or screened for diseases. Follow your health care provider's instructions on monitoring your cholesterol and blood pressure. This information is not intended to replace advice given to you by your health care provider. Make sure you discuss any questions you have with your healthcare provider. Document Revised: 06/25/2018 Document Reviewed: 06/25/2018 Elsevier Patient Education  2022 [employer].

## 2021-01-23 NOTE — Progress Notes (Addendum)
Subjective:     Joanna Baker is a 50 y.o. female and is here for a comprehensive physical exam. The patient reports problems - pt has trouble getting to sleep. Tried melatonin and OtC sleep aids. Would like rx. Mentions CP intermittently once or twice a month. She will get a central chest "achy" feeling for half the day. No jaw or arm pain  not worse with exertion. Not sure if related to eating or drinking.   Social History   Socioeconomic History   Marital status: Single    Spouse name: Not on file   Number of children: Not on file   Years of education: Not on file   Highest education level: Not on file  Occupational History   Not on file  Tobacco Use   Smoking status: Former    Packs/day: 0.25    Years: 5.00    Pack years: 1.25    Types: Cigarettes   Smokeless tobacco: Never  Vaping Use   Vaping Use: Never used  Substance and Sexual Activity   Alcohol use: Yes    Alcohol/week: 2.0 standard drinks    Types: 2 Cans of beer per week   Drug use: Yes    Types: Marijuana    Comment: history of use, not current use   Sexual activity: Yes    Partners: Female  Other Topics Concern   Not on file  Social History Narrative   Not on file   Social Determinants of Health   Financial Resource Strain: Not on file  Food Insecurity: Not on file  Transportation Needs: Not on file  Physical Activity: Not on file  Stress: Not on file  Social Connections: Not on file  Intimate Partner Violence: Not on file   Health Maintenance  Topic Date Due   COVID-19 Vaccine (4 - Booster) 02/08/2021 (Originally 09/06/2020)   INFLUENZA VACCINE  02/13/2021   MAMMOGRAM  05/17/2021   PAP SMEAR-Modifier  09/13/2023   COLONOSCOPY (Pts 45-54yrs Insurance coverage will need to be confirmed)  06/29/2025   TETANUS/TDAP  12/09/2028   Hepatitis C Screening  Completed   HIV Screening  Completed   HPV VACCINES  Aged Out   Pneumococcal Vaccine 54-53 Years old  Discontinued   .Marland Kitchen Family History  Problem  Relation Age of Onset   Pulmonary embolism Mother    Stroke Mother 1   Alcohol abuse Father    Suicidality Father    Heart failure Father    Alcoholism Father    Diabetes Father    Depression Paternal Grandfather    Heart attack Maternal Grandmother    Breast cancer Neg Hx     The following portions of the patient's history were reviewed and updated as appropriate: allergies, current medications, past family history, past medical history, past social history, past surgical history, and problem list.  Review of Systems Pertinent items noted in HPI and remainder of comprehensive ROS otherwise negative.   Objective:    BP 105/62   Pulse 86   Ht 5\' 7"  (1.702 m)   Wt 207 lb (93.9 kg)   SpO2 97%   BMI 32.42 kg/m  General appearance: alert, cooperative, appears stated age, and mildly obese Head: Normocephalic, without obvious abnormality, atraumatic Eyes: conjunctivae/corneas clear. PERRL, EOM's intact. Fundi benign. Ears: normal TM's and external ear canals both ears Nose: Nares normal. Septum midline. Mucosa normal. No drainage or sinus tenderness. Throat: lips, mucosa, and tongue normal; teeth and gums normal Neck: no adenopathy, no carotid bruit, no  JVD, supple, symmetrical, trachea midline, and thyroid not enlarged, symmetric, no tenderness/mass/nodules Back: symmetric, no curvature. ROM normal. No CVA tenderness. Lungs: clear to auscultation bilaterally Heart: regular rate and rhythm, S1, S2 normal, no murmur, click, rub or gallop Abdomen: soft, non-tender; bowel sounds normal; no masses,  no organomegaly Extremities: extremities normal, atraumatic, no cyanosis or edema Pulses: 2+ and symmetric Skin: Skin color, texture, turgor normal. No rashes or lesions Lymph nodes: Cervical, supraclavicular, and axillary nodes normal. Neurologic: Alert and oriented X 3, normal strength and tone. Normal symmetric reflexes. Normal coordination and gait    .Marland Kitchen Depression screen [facility] 2/9  01/23/2021 11/16/2020 03/16/2020 01/06/2020 12/10/2018  Decreased Interest 0 0 0 0 0  Down, Depressed, Hopeless 0 0 0 0 0  PHQ - 2 Score 0 0 0 0 0  Altered sleeping - - - 0 0  Tired, decreased energy - - - 0 0  Change in appetite - - - 0 0  Feeling bad or failure about yourself  - - - 0 0  Trouble concentrating - - - 0 0  Moving slowly or fidgety/restless - - - 0 0  Suicidal thoughts - - - 0 0  PHQ-9 Score - - - 0 0  Difficult doing work/chores - - - Not difficult at all Not difficult at all   .Marland Kitchen GAD 7 : Generalized Anxiety Score 01/06/2020 12/10/2018 05/13/2018 10/25/2017  Nervous, Anxious, on Edge 1 0 1 1  Control/stop worrying 0 0 0 0  Worry too much - different things 0 0 0 0  Trouble relaxing 0 0 0 0  Restless 0 0 0 0  Easily annoyed or irritable 1 2 0 0  Afraid - awful might happen 0 0 0 0  Total GAD 7 Score 2 2 1 1   Anxiety Difficulty Not difficult at all Somewhat difficult Not difficult at all Not difficult at all     Assessment:    Healthy female exam.     Plan:    Marland KitchenMarland KitchenJaime was seen today for annual exam.  Diagnoses and all orders for this visit:  Routine physical examination -     CBC with Differential/Platelet -     COMPLETE METABOLIC PANEL WITH GFR -     Lipid Panel w/reflex Direct LDL -     TSH -     Vitamin D (25 hydroxy)  Vitamin D insufficiency -     Vitamin D (25 hydroxy)  Screening for diabetes mellitus -     COMPLETE METABOLIC PANEL WITH GFR  Screening for lipid disorders -     Lipid Panel w/reflex Direct LDL  Thyroid disorder screen -     TSH  Atypical chest pain -     EKG 12-Lead  B12 deficiency -     B12 and Folate Panel  Trouble in sleeping -     traZODone (DESYREL) 50 MG tablet; Take 0.5-1 tablets (25-50 mg total) by mouth at bedtime as needed for sleep.   .. Discussed 150 minutes of exercise a week.  Encouraged vitamin D 1000 units and Calcium 1300mg  or 4 servings of dairy a day.  PHQ/GAD no concerns.  Fasting labs ordered.   Colonoscopy UTD.  Mammogram UTD.  Pap UTD.  Covid vaccine UTD.   EKG no concerns.  Suggest stress test to fully screen.  Lipid ordered.  Family hx of heart disease/MI/stroke.  ?GERD. Try pepcid at onset of symptoms to see if helps.   Discussed good sleep hygiene.  Failed OTC  options.  Start trazodone. Discussed side effects. If worsening of PVC stop trazodone.    See After Visit Summary for Counseling Recommendations

## 2021-01-24 LAB — CBC WITH DIFFERENTIAL/PLATELET
Absolute Monocytes: 583 cells/uL (ref 200–950)
Basophils Absolute: 37 cells/uL (ref 0–200)
Basophils Relative: 0.6 %
Eosinophils Absolute: 81 cells/uL (ref 15–500)
Eosinophils Relative: 1.3 %
HCT: 41.8 % (ref 35.0–45.0)
Hemoglobin: 13.6 g/dL (ref 11.7–15.5)
Lymphs Abs: 1550 cells/uL (ref 850–3900)
MCH: 30.2 pg (ref 27.0–33.0)
MCHC: 32.5 g/dL (ref 32.0–36.0)
MCV: 92.7 fL (ref 80.0–100.0)
MPV: 10.7 fL (ref 7.5–12.5)
Monocytes Relative: 9.4 %
Neutro Abs: 3949 cells/uL (ref 1500–7800)
Neutrophils Relative %: 63.7 %
Platelets: 250 10*3/uL (ref 140–400)
RBC: 4.51 10*6/uL (ref 3.80–5.10)
RDW: 12.8 % (ref 11.0–15.0)
Total Lymphocyte: 25 %
WBC: 6.2 10*3/uL (ref 3.8–10.8)

## 2021-01-24 LAB — COMPLETE METABOLIC PANEL WITH GFR
AG Ratio: 2 (calc) (ref 1.0–2.5)
ALT: 19 U/L (ref 6–29)
AST: 17 U/L (ref 10–35)
Albumin: 4.3 g/dL (ref 3.6–5.1)
Alkaline phosphatase (APISO): 40 U/L (ref 31–125)
BUN: 11 mg/dL (ref 7–25)
CO2: 29 mmol/L (ref 20–32)
Calcium: 9 mg/dL (ref 8.6–10.2)
Chloride: 106 mmol/L (ref 98–110)
Creat: 0.63 mg/dL (ref 0.50–0.99)
Globulin: 2.1 g/dL (calc) (ref 1.9–3.7)
Glucose, Bld: 89 mg/dL (ref 65–99)
Potassium: 4.5 mmol/L (ref 3.5–5.3)
Sodium: 141 mmol/L (ref 135–146)
Total Bilirubin: 0.3 mg/dL (ref 0.2–1.2)
Total Protein: 6.4 g/dL (ref 6.1–8.1)
eGFR: 109 mL/min/{1.73_m2} (ref >=60)

## 2021-01-24 LAB — LIPID PANEL W/REFLEX DIRECT LDL
Cholesterol: 176 mg/dL (ref <200)
HDL: 61 mg/dL (ref >=50)
LDL Cholesterol (Calc): 100 mg/dL (calc) — ABNORMAL HIGH
Non-HDL Cholesterol (Calc): 115 mg/dL (calc) (ref <130)
Total CHOL/HDL Ratio: 2.9 (calc) (ref <5.0)
Triglycerides: 60 mg/dL (ref <150)

## 2021-01-24 LAB — TSH: TSH: 1.84 mIU/L

## 2021-01-24 LAB — B12 AND FOLATE PANEL
Folate: >24 ng/mL
Vitamin B-12: 489 pg/mL (ref 200–1100)

## 2021-01-24 LAB — VITAMIN D 25 HYDROXY (VIT D DEFICIENCY, FRACTURES): Vit D, 25-Hydroxy: 33 ng/mL (ref 30–100)

## 2021-01-24 NOTE — Progress Notes (Signed)
Drea,   Thyroid looks great.  Vitamin D normal range but could be higher. What unit dose are you taking now?  B12 GREAT.   JJ, I do not see lipid or cmp?

## 2021-01-24 NOTE — Telephone Encounter (Signed)
Placed in scanning basket.

## 2021-01-25 ENCOUNTER — Encounter: Payer: Self-pay | Admitting: Physician Assistant

## 2021-01-25 NOTE — Progress Notes (Signed)
This is labs that cmp and lipid have not resulted.

## 2021-01-27 DIAGNOSIS — F4323 Adjustment disorder with mixed anxiety and depressed mood: Secondary | ICD-10-CM | POA: Diagnosis not present

## 2021-01-27 NOTE — Progress Notes (Signed)
Joanna Baker,   Kidney, liver, glucose look great.  HDL, good cholesterol, is great.  LDL, bad cholesterol, is up a tad from last year but overall looks GREAT.

## 2021-01-29 DIAGNOSIS — G479 Sleep disorder, unspecified: Secondary | ICD-10-CM | POA: Insufficient documentation

## 2021-02-08 ENCOUNTER — Encounter: Payer: Self-pay | Admitting: Physician Assistant

## 2021-02-08 DIAGNOSIS — F4323 Adjustment disorder with mixed anxiety and depressed mood: Secondary | ICD-10-CM | POA: Diagnosis not present

## 2021-02-10 ENCOUNTER — Telehealth (HOSPITAL_COMMUNITY): Payer: Self-pay | Admitting: *Deleted

## 2021-02-10 NOTE — Telephone Encounter (Signed)
Close encounter 

## 2021-02-13 ENCOUNTER — Ambulatory Visit: Payer: BC Managed Care – PPO | Admitting: Sports Medicine

## 2021-02-15 ENCOUNTER — Ambulatory Visit: Payer: BC Managed Care – PPO | Admitting: Sports Medicine

## 2021-02-15 ENCOUNTER — Ambulatory Visit (HOSPITAL_COMMUNITY)
Admission: RE | Admit: 2021-02-15 | Discharge: 2021-02-15 | Disposition: A | Discharge status: Home / Self Care | Payer: BC Managed Care – PPO | Source: Ambulatory Visit | Attending: Cardiovascular Disease | Admitting: Cardiovascular Disease

## 2021-02-15 ENCOUNTER — Other Ambulatory Visit: Payer: Self-pay

## 2021-02-15 DIAGNOSIS — R0789 Other chest pain: Secondary | ICD-10-CM | POA: Diagnosis not present

## 2021-02-15 LAB — EXERCISE TOLERANCE TEST
Estimated workload: 6.9 METS
Exercise duration (min): 4 min
Exercise duration (sec): 57 s
MPHR: 172 {beats}/min
Peak HR: 130 {beats}/min
Percent HR: 75 %
Rest HR: 83 {beats}/min

## 2021-02-16 NOTE — Progress Notes (Signed)
Overall normal stress test but because target heart rate was not met test is not diagnostic. If you continue to have chest pain then I suggest referral to cardiologist for further work up.

## 2021-02-17 ENCOUNTER — Other Ambulatory Visit: Payer: Self-pay | Admitting: Physician Assistant

## 2021-02-17 DIAGNOSIS — R0789 Other chest pain: Secondary | ICD-10-CM

## 2021-02-17 NOTE — Progress Notes (Signed)
.   Family History  Problem Relation Age of Onset   Pulmonary embolism Mother    Stroke Mother 24   Alcohol abuse Father    Suicidality Father    Heart failure Father    Alcoholism Father    Diabetes Father    Depression Paternal Grandfather    Heart attack Maternal Grandmother    Breast cancer Neg Hx    Sent cardiology referral for more work up. Non-diagnostic stress test.

## 2021-02-21 ENCOUNTER — Ambulatory Visit (INDEPENDENT_AMBULATORY_CARE_PROVIDER_SITE_OTHER): Payer: BC Managed Care – PPO | Admitting: Sports Medicine

## 2021-02-21 DIAGNOSIS — M5412 Radiculopathy, cervical region: Secondary | ICD-10-CM | POA: Diagnosis not present

## 2021-02-21 NOTE — Assessment & Plan Note (Signed)
This is a pleasant 50 year old female, we have been treating her for right-sided cervical radiculitis, MRI showed C5-C6 DDD likely causing right C6 radiculitis, today I filled out some paperwork, she will try additional measures including acupuncture and dry needling before considering a cervical epidural. Of note averse to gabapentin and Lyrica. Paperwork filled out today was for accommodations for modifications for her monitor for improved ergonomics at work. Return in 6 weeks.

## 2021-02-21 NOTE — Progress Notes (Signed)
    Procedures performed today:    None.  Independent interpretation of notes and tests performed by another provider:   None.  Brief History, Exam, Impression, and Recommendations:    Radiculitis of right cervical region This is a pleasant 50 year old female, we have been treating her for right-sided cervical radiculitis, MRI showed C5-C6 DDD likely causing right C6 radiculitis, today I filled out some paperwork, she will try additional measures including acupuncture and dry needling before considering a cervical epidural. Of note averse to gabapentin and Lyrica. Paperwork filled out today was for accommodations for modifications for her monitor for improved ergonomics at work. Return in 6 weeks.    ___________________________________________ Gwen Her. Dianah Field, M.D., ABFM., CAQSM. Primary Care and [place] Instructor of [place] of [facility] of Medicine

## 2021-02-22 DIAGNOSIS — F4323 Adjustment disorder with mixed anxiety and depressed mood: Secondary | ICD-10-CM | POA: Diagnosis not present

## 2021-03-02 ENCOUNTER — Other Ambulatory Visit: Payer: Self-pay | Admitting: Physician Assistant

## 2021-03-02 DIAGNOSIS — J302 Other seasonal allergic rhinitis: Secondary | ICD-10-CM

## 2021-03-02 DIAGNOSIS — R059 Cough, unspecified: Secondary | ICD-10-CM

## 2021-03-02 DIAGNOSIS — Z9109 Other allergy status, other than to drugs and biological substances: Secondary | ICD-10-CM

## 2021-03-08 DIAGNOSIS — F4323 Adjustment disorder with mixed anxiety and depressed mood: Secondary | ICD-10-CM | POA: Diagnosis not present

## 2021-04-04 ENCOUNTER — Ambulatory Visit (INDEPENDENT_AMBULATORY_CARE_PROVIDER_SITE_OTHER): Payer: BC Managed Care – PPO | Admitting: Sports Medicine

## 2021-04-04 DIAGNOSIS — M5412 Radiculopathy, cervical region: Secondary | ICD-10-CM | POA: Diagnosis not present

## 2021-04-04 MED ORDER — CYCLOBENZAPRINE HCL 10 MG PO TABS: ORAL_TABLET | ORAL | 2 refills | Status: DC

## 2021-04-04 NOTE — Progress Notes (Signed)
    Procedures performed today:    None.  Independent interpretation of notes and tests performed by another provider:   None.  Brief History, Exam, Impression, and Recommendations:    Radiculitis of right cervical region This pleasant 50 year old female returns doing significantly better, she has right-sided C6 distribution radiculitis, MRI did show C5-C6 DDD likely compressing the right C6 nerve root, she is doing a lot better with improved ergonomics at work, with her monitors, she has not yet started acupuncture but plans on it, she is averse to most medications but she is willing to try a bit of Flexeril at night, I will write her prescription for this. Mostly to add some additional home conditioning exercises, and she can certainly just message me for a right C6-C7 interlaminar epidural if all else fails. I have advised that she choose either trazodone or Flexeril at night rather than both.    ___________________________________________ Gwen Her. Dianah Field, M.D., ABFM., CAQSM. Primary Care and [place] Instructor of [place] [facility]

## 2021-04-04 NOTE — Assessment & Plan Note (Addendum)
This pleasant 50 year old female returns doing significantly better, she has right-sided C6 distribution radiculitis, MRI did show C5-C6 DDD likely compressing the right C6 nerve root, she is doing a lot better with improved ergonomics at work, with her monitors, she has not yet started acupuncture but plans on it, she is averse to most medications but she is willing to try a bit of Flexeril at night, I will write her prescription for this. Mostly to add some additional home conditioning exercises, and she can certainly just message me for a right C6-C7 interlaminar epidural if all else fails. I have advised that she choose either trazodone or Flexeril at night rather than both.

## 2021-04-13 ENCOUNTER — Encounter: Payer: Self-pay | Admitting: Physician Assistant

## 2021-04-13 DIAGNOSIS — F411 Generalized anxiety disorder: Secondary | ICD-10-CM

## 2021-04-13 MED ORDER — LORAZEPAM 0.5 MG PO TABS: 0.5000 mg | ORAL_TABLET | Freq: Three times a day (TID) | ORAL | 1 refills | Status: DC | PRN

## 2021-04-14 ENCOUNTER — Ambulatory Visit: Payer: BC Managed Care – PPO | Admitting: Cardiology

## 2021-05-08 DIAGNOSIS — M4724 Other spondylosis with radiculopathy, thoracic region: Secondary | ICD-10-CM | POA: Diagnosis not present

## 2021-05-08 DIAGNOSIS — M5441 Lumbago with sciatica, right side: Secondary | ICD-10-CM | POA: Diagnosis not present

## 2021-05-08 DIAGNOSIS — M4723 Other spondylosis with radiculopathy, cervicothoracic region: Secondary | ICD-10-CM | POA: Diagnosis not present

## 2021-05-08 DIAGNOSIS — M4728 Other spondylosis with radiculopathy, sacral and sacrococcygeal region: Secondary | ICD-10-CM | POA: Diagnosis not present

## 2021-05-09 DIAGNOSIS — M4728 Other spondylosis with radiculopathy, sacral and sacrococcygeal region: Secondary | ICD-10-CM | POA: Diagnosis not present

## 2021-05-09 DIAGNOSIS — M4724 Other spondylosis with radiculopathy, thoracic region: Secondary | ICD-10-CM | POA: Diagnosis not present

## 2021-05-09 DIAGNOSIS — M5441 Lumbago with sciatica, right side: Secondary | ICD-10-CM | POA: Diagnosis not present

## 2021-05-09 DIAGNOSIS — M4723 Other spondylosis with radiculopathy, cervicothoracic region: Secondary | ICD-10-CM | POA: Diagnosis not present

## 2021-05-10 DIAGNOSIS — M5441 Lumbago with sciatica, right side: Secondary | ICD-10-CM | POA: Diagnosis not present

## 2021-05-10 DIAGNOSIS — M4728 Other spondylosis with radiculopathy, sacral and sacrococcygeal region: Secondary | ICD-10-CM | POA: Diagnosis not present

## 2021-05-10 DIAGNOSIS — M4723 Other spondylosis with radiculopathy, cervicothoracic region: Secondary | ICD-10-CM | POA: Diagnosis not present

## 2021-05-10 DIAGNOSIS — M4724 Other spondylosis with radiculopathy, thoracic region: Secondary | ICD-10-CM | POA: Diagnosis not present

## 2021-05-11 DIAGNOSIS — M5441 Lumbago with sciatica, right side: Secondary | ICD-10-CM | POA: Diagnosis not present

## 2021-05-11 DIAGNOSIS — M4723 Other spondylosis with radiculopathy, cervicothoracic region: Secondary | ICD-10-CM | POA: Diagnosis not present

## 2021-05-11 DIAGNOSIS — M4728 Other spondylosis with radiculopathy, sacral and sacrococcygeal region: Secondary | ICD-10-CM | POA: Diagnosis not present

## 2021-05-11 DIAGNOSIS — M4724 Other spondylosis with radiculopathy, thoracic region: Secondary | ICD-10-CM | POA: Diagnosis not present

## 2021-05-12 ENCOUNTER — Encounter: Payer: Self-pay | Admitting: Physician Assistant

## 2021-05-15 DIAGNOSIS — M4728 Other spondylosis with radiculopathy, sacral and sacrococcygeal region: Secondary | ICD-10-CM | POA: Diagnosis not present

## 2021-05-15 DIAGNOSIS — M4724 Other spondylosis with radiculopathy, thoracic region: Secondary | ICD-10-CM | POA: Diagnosis not present

## 2021-05-15 DIAGNOSIS — M4723 Other spondylosis with radiculopathy, cervicothoracic region: Secondary | ICD-10-CM | POA: Diagnosis not present

## 2021-05-15 DIAGNOSIS — M5441 Lumbago with sciatica, right side: Secondary | ICD-10-CM | POA: Diagnosis not present

## 2021-05-15 NOTE — Progress Notes (Signed)
Referring-Jade Breeback PA-C Reason for referral-chest pain  HPI: 50 year old female for evaluation of chest pain at request of Iran Planas PA-C.  Monitor June 2018 showed sinus rhythm with occasional PAC and PVC.  Echocardiogram June 2018 showed normal LV function.  Exercise treadmill August 2022 showed no ST changes but she failed to reach target heart rate (75%).  Patient states that she has had intermittent chest pain that is substernal.  It is described as a burning and a tightness.  Can occur with activities.  Typically lasts 30 minutes to an hour and a half.  Resolve spontaneously.  Occasional mild diaphoresis but no nausea.  She also notes some dyspnea on exertion but no orthopnea or PND.  Occasional pedal edema.  No syncope.  Because of the above cardiology asked to evaluate.  Current Outpatient Medications  Medication Sig Dispense Refill   acetaminophen (TYLENOL) 500 MG tablet Take 500 mg by mouth every 6 (six) hours as needed.     albuterol (VENTOLIN HFA) 108 (90 Base) MCG/ACT inhaler INHALE 2 PUFFS INTO THE LUNGS EVERY 6 HOURS AS NEEDED FOR WHEEZING OR SHORTNESS OF BREATH 18 g 1   calcium-vitamin D (OSCAL WITH D) 500-200 MG-UNIT TABS tablet Take by mouth.     cyanocobalamin (,VITAMIN B-12,) 1000 MCG/ML injection ADMINISTER 1 ML(1000 MCG) IN THE MUSCLE EVERY 30 DAYS 3 mL 3   cyclobenzaprine (FLEXERIL) 10 MG tablet 1/2-1 tab p.o. nightly 30 tablet 2   EPINEPHrine 0.3 mg/0.3 mL IJ SOAJ injection Inject into the muscle.     LORazepam (ATIVAN) 0.5 MG tablet Take 1 tablet (0.5 mg total) by mouth every 8 (eight) hours as needed. for anxiety 30 tablet 1   montelukast (SINGULAIR) 10 MG tablet Take 1 tablet (10 mg total) by mouth at bedtime. 90 tablet 3   naproxen sodium (ANAPROX) 220 MG tablet Take 220 mg by mouth as needed.      pseudoephedrine (SUDAFED) 30 MG tablet Take 30 mg by mouth every 4 (four) hours as needed for congestion (allergies).     traZODone (DESYREL) 50 MG tablet Take  0.5-1 tablets (25-50 mg total) by mouth at bedtime as needed for sleep. 30 tablet 3   No current facility-administered medications for this visit.    Allergies  Allergen Reactions   Influenza Vaccines Anaphylaxis   Iodine Anaphylaxis   Chantix [Varenicline Tartrate] Other (See Comments)    Mood changes/extreme made her want to kill herself   Lobster [Shellfish Allergy] Itching   Penicillins Hives   Prednisone Other (See Comments)    Mood changes.   Prozac [Fluoxetine Hcl]     Palpitations.   Suboxone [Buprenorphine Hcl-Naloxone Hcl]    Sulfa Antibiotics Hives     Past Medical History:  Diagnosis Date   ACL injury tear    Right knee, not repaired.     Anxiety    Asthma    Depression    Fatty liver    IBS (irritable bowel syndrome)    TB (tuberculosis), treated     6 month of INH at age 56    Past Surgical History:  Procedure Laterality Date   TONSILLECTOMY  1993    Social History   Socioeconomic History   Marital status: Single    Spouse name: Not on file   Number of children: Not on file   Years of education: Not on file   Highest education level: Not on file  Occupational History   Not on file  Tobacco Use  Smoking status: Former    Packs/day: 0.25    Years: 5.00    Pack years: 1.25    Types: Cigarettes   Smokeless tobacco: Never  Vaping Use   Vaping Use: Never used  Substance and Sexual Activity   Alcohol use: Yes    Alcohol/week: 2.0 standard drinks    Types: 2 Cans of beer per week    Comment: Occasional   Drug use: Yes    Types: Marijuana    Comment: history of use, not current use   Sexual activity: Yes    Partners: Female  Other Topics Concern   Not on file  Social History Narrative   Not on file   Social Determinants of Health   Financial Resource Strain: Not on file  Food Insecurity: Not on file  Transportation Needs: Not on file  Physical Activity: Not on file  Stress: Not on file  Social Connections: Not on file  Intimate  Partner Violence: Not on file    Family History  Problem Relation Age of Onset   Pulmonary embolism Mother    Stroke Mother 43   Alcohol abuse Father    Suicidality Father    Heart failure Father    Alcoholism Father    Diabetes Father    Heart attack Maternal Grandmother    Depression Paternal Grandfather    Breast cancer Neg Hx     ROS: no fevers or chills, productive cough, hemoptysis, dysphasia, odynophagia, melena, hematochezia, dysuria, hematuria, rash, seizure activity, orthopnea, PND, pedal edema, claudication. Remaining systems are negative.  Physical Exam:   Blood pressure 112/72, pulse 98, height 5\' 7"  (1.702 m), weight 195 lb 12.8 oz (88.8 kg), SpO2 97 %.  General:  Well developed/well nourished in NAD Skin warm/dry Patient not depressed No peripheral clubbing Back-normal HEENT-normal/normal eyelids Neck supple/normal carotid upstroke bilaterally; no bruits; no JVD; no thyromegaly chest - CTA/ normal expansion CV - RRR/normal S1 and S2; no murmurs, rubs or gallops;  PMI nondisplaced Abdomen -NT/ND, no HSM, no mass, + bowel sounds, no bruit 2+ femoral pulses, no bruits Ext-no edema, chords, 2+ DP Neuro-grossly nonfocal  ECG -01/23/2021-normal sinus rhythm with no ST changes.  Personally reviewed  Today's electrocardiogram shows normal sinus rhythm, nonspecific ST changes.  Personally reviewed.  A/P  1 chest pain-symptoms are somewhat atypical.  Electrocardiogram with no diagnostic ST changes.  Previous treadmill nondiagnostic as patient did not achieve target heart rate.  I will arrange a cardiac CTA to more fully assess.  She has an iodine allergy so we will premedicate.  2 history of anxiety-Per primary care.  Kirk Ruths, MD

## 2021-05-16 DIAGNOSIS — M5441 Lumbago with sciatica, right side: Secondary | ICD-10-CM | POA: Diagnosis not present

## 2021-05-16 DIAGNOSIS — M4728 Other spondylosis with radiculopathy, sacral and sacrococcygeal region: Secondary | ICD-10-CM | POA: Diagnosis not present

## 2021-05-16 DIAGNOSIS — M4723 Other spondylosis with radiculopathy, cervicothoracic region: Secondary | ICD-10-CM | POA: Diagnosis not present

## 2021-05-16 DIAGNOSIS — M4724 Other spondylosis with radiculopathy, thoracic region: Secondary | ICD-10-CM | POA: Diagnosis not present

## 2021-05-17 DIAGNOSIS — M4724 Other spondylosis with radiculopathy, thoracic region: Secondary | ICD-10-CM | POA: Diagnosis not present

## 2021-05-17 DIAGNOSIS — M4723 Other spondylosis with radiculopathy, cervicothoracic region: Secondary | ICD-10-CM | POA: Diagnosis not present

## 2021-05-17 DIAGNOSIS — M5441 Lumbago with sciatica, right side: Secondary | ICD-10-CM | POA: Diagnosis not present

## 2021-05-17 DIAGNOSIS — M4728 Other spondylosis with radiculopathy, sacral and sacrococcygeal region: Secondary | ICD-10-CM | POA: Diagnosis not present

## 2021-05-19 ENCOUNTER — Ambulatory Visit: Payer: BC Managed Care – PPO | Admitting: Physician Assistant

## 2021-05-22 ENCOUNTER — Encounter: Payer: Self-pay | Admitting: Cardiology

## 2021-05-22 ENCOUNTER — Ambulatory Visit (INDEPENDENT_AMBULATORY_CARE_PROVIDER_SITE_OTHER): Payer: BC Managed Care – PPO | Admitting: Cardiology

## 2021-05-22 ENCOUNTER — Other Ambulatory Visit: Payer: Self-pay

## 2021-05-22 VITALS — BP 112/72 | HR 98 | Ht 67.0 in | Wt 195.8 lb

## 2021-05-22 DIAGNOSIS — R072 Precordial pain: Secondary | ICD-10-CM

## 2021-05-22 DIAGNOSIS — M4724 Other spondylosis with radiculopathy, thoracic region: Secondary | ICD-10-CM | POA: Diagnosis not present

## 2021-05-22 DIAGNOSIS — M5441 Lumbago with sciatica, right side: Secondary | ICD-10-CM | POA: Diagnosis not present

## 2021-05-22 DIAGNOSIS — M4728 Other spondylosis with radiculopathy, sacral and sacrococcygeal region: Secondary | ICD-10-CM | POA: Diagnosis not present

## 2021-05-22 DIAGNOSIS — M4723 Other spondylosis with radiculopathy, cervicothoracic region: Secondary | ICD-10-CM | POA: Diagnosis not present

## 2021-05-22 MED ORDER — METOPROLOL TARTRATE 100 MG PO TABS: ORAL_TABLET | ORAL | 0 refills | Status: DC

## 2021-05-22 MED ORDER — PREDNISONE 50 MG PO TABS
ORAL_TABLET | ORAL | 0 refills | Status: DC
Start: 2021-05-22 — End: 2021-06-16

## 2021-05-22 NOTE — Patient Instructions (Signed)
  Testing/Procedures:  Your cardiac CT will be scheduled at   [facility] [place], [place] [address] [phone]  If scheduled at [place], please arrive at the [facility] main entrance (entrance A) of [facility] 30 minutes prior to test start time. You can use the FREE valet parking offered at the main entrance (encouraged to control the heart rate for the test) Proceed to the [facility] Radiology Department (first floor) to check-in and test prep.  Please follow these instructions carefully (unless otherwise directed):  On the Night Before the Test: Be sure to Drink plenty of water. Do not consume any caffeinated/decaffeinated beverages or chocolate 12 hours prior to your test. Do not take any antihistamines 12 hours prior to your test. TAKE PREDNISONE 50 MG 13 HOURS PRIOR, 7 HOURS PRIOR AND 1 HOUR PRIOR TO CT SCAN  TAKE BENADRYL 25 MG ONCE HOUR PRIOR TO CT SCAN  On the Day of the Test: Drink plenty of water until 1 hour prior to the test. Do not eat any food 4 hours prior to the test. You may take your regular medications prior to the test.  Take metoprolol (Lopressor) 100 MG two hours prior to test. HOLD Furosemide/Hydrochlorothiazide morning of the test. FEMALES- please wear underwire-free bra if available, avoid dresses & tight clothing    After the Test: Drink plenty of water. After receiving IV contrast, you may experience a mild flushed feeling. This is normal. On occasion, you may experience a mild rash up to 24 hours after the test. This is not dangerous. If this occurs, you can take Benadryl 25 mg and increase your fluid intake. If you experience trouble breathing, this can be serious. If it is severe call 911 IMMEDIATELY. If it is mild, please call our office. If you take any of these medications: Glipizide/Metformin, Avandament, Glucavance, please do not take 48 hours after completing test unless otherwise  instructed.  Please allow 2-4 weeks for scheduling of routine cardiac CTs. Some insurance companies require a pre-authorization which may delay scheduling of this test.   For non-scheduling related questions, please contact the cardiac imaging nurse navigator should you have any questions/concerns: Marchia Bond, Cardiac Imaging Nurse Navigator Gordy Clement, Cardiac Imaging Nurse Navigator [facility] Direct Office Dial: [phone]   For scheduling needs, including cancellations and rescheduling, please call [place], [phone].    Follow-Up: At [facility], you and your health needs are our priority.  As part of our continuing mission to provide you with exceptional heart care, we have created designated Provider Care Teams.  These Care Teams include your primary Cardiologist (physician) and Advanced Practice Providers (APPs -  Physician Assistants and Nurse Practitioners) who all work together to provide you with the care you need, when you need it.  We recommend signing up for the patient portal called "MyChart".  Sign up information is provided on this After Visit Summary.  MyChart is used to connect with patients for Virtual Visits (Telemedicine).  Patients are able to view lab/test results, encounter notes, upcoming appointments, etc.  Non-urgent messages can be sent to your provider as well.   To learn more about what you can do with MyChart, go to [email or web address].    Your next appointment:    AS NEEDED

## 2021-05-23 DIAGNOSIS — M4724 Other spondylosis with radiculopathy, thoracic region: Secondary | ICD-10-CM | POA: Diagnosis not present

## 2021-05-23 DIAGNOSIS — M4723 Other spondylosis with radiculopathy, cervicothoracic region: Secondary | ICD-10-CM | POA: Diagnosis not present

## 2021-05-23 DIAGNOSIS — M4728 Other spondylosis with radiculopathy, sacral and sacrococcygeal region: Secondary | ICD-10-CM | POA: Diagnosis not present

## 2021-05-23 DIAGNOSIS — M5441 Lumbago with sciatica, right side: Secondary | ICD-10-CM | POA: Diagnosis not present

## 2021-05-24 DIAGNOSIS — M5441 Lumbago with sciatica, right side: Secondary | ICD-10-CM | POA: Diagnosis not present

## 2021-05-24 DIAGNOSIS — M4724 Other spondylosis with radiculopathy, thoracic region: Secondary | ICD-10-CM | POA: Diagnosis not present

## 2021-05-24 DIAGNOSIS — M4723 Other spondylosis with radiculopathy, cervicothoracic region: Secondary | ICD-10-CM | POA: Diagnosis not present

## 2021-05-24 DIAGNOSIS — M4728 Other spondylosis with radiculopathy, sacral and sacrococcygeal region: Secondary | ICD-10-CM | POA: Diagnosis not present

## 2021-05-25 DIAGNOSIS — R922 Inconclusive mammogram: Secondary | ICD-10-CM | POA: Diagnosis not present

## 2021-05-25 DIAGNOSIS — M4723 Other spondylosis with radiculopathy, cervicothoracic region: Secondary | ICD-10-CM | POA: Diagnosis not present

## 2021-05-25 DIAGNOSIS — M5441 Lumbago with sciatica, right side: Secondary | ICD-10-CM | POA: Diagnosis not present

## 2021-05-25 DIAGNOSIS — M4728 Other spondylosis with radiculopathy, sacral and sacrococcygeal region: Secondary | ICD-10-CM | POA: Diagnosis not present

## 2021-05-25 DIAGNOSIS — M4724 Other spondylosis with radiculopathy, thoracic region: Secondary | ICD-10-CM | POA: Diagnosis not present

## 2021-05-25 DIAGNOSIS — R928 Other abnormal and inconclusive findings on diagnostic imaging of breast: Secondary | ICD-10-CM | POA: Diagnosis not present

## 2021-05-27 DIAGNOSIS — Z20822 Contact with and (suspected) exposure to covid-19: Secondary | ICD-10-CM | POA: Diagnosis not present

## 2021-05-27 LAB — HM MAMMOGRAPHY

## 2021-05-28 DIAGNOSIS — U071 COVID-19: Secondary | ICD-10-CM | POA: Diagnosis not present

## 2021-05-28 DIAGNOSIS — R52 Pain, unspecified: Secondary | ICD-10-CM | POA: Diagnosis not present

## 2021-05-30 ENCOUNTER — Ambulatory Visit: Payer: BC Managed Care – PPO | Admitting: Physician Assistant

## 2021-06-05 ENCOUNTER — Ambulatory Visit (HOSPITAL_COMMUNITY): Payer: BC Managed Care – PPO

## 2021-06-05 DIAGNOSIS — M4728 Other spondylosis with radiculopathy, sacral and sacrococcygeal region: Secondary | ICD-10-CM | POA: Diagnosis not present

## 2021-06-05 DIAGNOSIS — M4724 Other spondylosis with radiculopathy, thoracic region: Secondary | ICD-10-CM | POA: Diagnosis not present

## 2021-06-05 DIAGNOSIS — M4723 Other spondylosis with radiculopathy, cervicothoracic region: Secondary | ICD-10-CM | POA: Diagnosis not present

## 2021-06-05 DIAGNOSIS — M5441 Lumbago with sciatica, right side: Secondary | ICD-10-CM | POA: Diagnosis not present

## 2021-06-06 DIAGNOSIS — M4728 Other spondylosis with radiculopathy, sacral and sacrococcygeal region: Secondary | ICD-10-CM | POA: Diagnosis not present

## 2021-06-06 DIAGNOSIS — M4724 Other spondylosis with radiculopathy, thoracic region: Secondary | ICD-10-CM | POA: Diagnosis not present

## 2021-06-06 DIAGNOSIS — M4723 Other spondylosis with radiculopathy, cervicothoracic region: Secondary | ICD-10-CM | POA: Diagnosis not present

## 2021-06-06 DIAGNOSIS — M5441 Lumbago with sciatica, right side: Secondary | ICD-10-CM | POA: Diagnosis not present

## 2021-06-07 ENCOUNTER — Telehealth (HOSPITAL_COMMUNITY): Payer: Self-pay | Admitting: Emergency Medicine

## 2021-06-07 DIAGNOSIS — R072 Precordial pain: Secondary | ICD-10-CM | POA: Diagnosis not present

## 2021-06-07 MED ORDER — IVABRADINE HCL 5 MG PO TABS
10.0000 mg | ORAL_TABLET | Freq: Once | ORAL | 0 refills | Status: AC
Start: 2021-06-07 — End: 2021-06-07

## 2021-06-07 NOTE — Telephone Encounter (Signed)
Attempted to call patient regarding upcoming cardiac CT appointment. Left message on voicemail with name and callback number Marchia Bond RN Navigator Cardiac Imaging [facility] [phone] Office [phone] Cell  Prescribed ivabradine for additional HR control - sent to her pharm on file. (Explained reason for it in the vm) encouraged her to call back with questions , if any

## 2021-06-08 LAB — BASIC METABOLIC PANEL
BUN: 11 mg/dL (ref 7–25)
CO2: 27 mmol/L (ref 20–32)
Calcium: 8.7 mg/dL (ref 8.6–10.2)
Chloride: 107 mmol/L (ref 98–110)
Creat: 0.6 mg/dL (ref 0.50–0.99)
Glucose, Bld: 93 mg/dL (ref 65–99)
Potassium: 3.8 mmol/L (ref 3.5–5.3)
Sodium: 141 mmol/L (ref 135–146)

## 2021-06-12 ENCOUNTER — Ambulatory Visit (HOSPITAL_COMMUNITY)
Admission: RE | Admit: 2021-06-12 | Discharge: 2021-06-12 | Disposition: A | Discharge status: Home / Self Care | Payer: BC Managed Care – PPO | Source: Ambulatory Visit | Attending: Cardiology | Admitting: Cardiology

## 2021-06-12 ENCOUNTER — Ambulatory Visit: Payer: BC Managed Care – PPO | Admitting: Physician Assistant

## 2021-06-12 ENCOUNTER — Other Ambulatory Visit: Payer: Self-pay

## 2021-06-12 DIAGNOSIS — M4728 Other spondylosis with radiculopathy, sacral and sacrococcygeal region: Secondary | ICD-10-CM | POA: Diagnosis not present

## 2021-06-12 DIAGNOSIS — M5441 Lumbago with sciatica, right side: Secondary | ICD-10-CM | POA: Diagnosis not present

## 2021-06-12 DIAGNOSIS — M4723 Other spondylosis with radiculopathy, cervicothoracic region: Secondary | ICD-10-CM | POA: Diagnosis not present

## 2021-06-12 DIAGNOSIS — M4724 Other spondylosis with radiculopathy, thoracic region: Secondary | ICD-10-CM | POA: Diagnosis not present

## 2021-06-12 DIAGNOSIS — R072 Precordial pain: Secondary | ICD-10-CM | POA: Insufficient documentation

## 2021-06-12 MED ORDER — DILTIAZEM HCL 25 MG/5ML IV SOLN: 10.0000 mg | INTRAVENOUS | Status: DC | PRN

## 2021-06-12 MED ORDER — IOHEXOL 350 MG/ML SOLN
95.0000 mL | Freq: Once | INTRAVENOUS | Status: AC | PRN
  Administered 2021-06-12: 11:00:00 95 mL via INTRAVENOUS

## 2021-06-12 MED ORDER — METOPROLOL TARTRATE 5 MG/5ML IV SOLN: 10.0000 mg | INTRAVENOUS | Status: DC | PRN

## 2021-06-12 MED ORDER — NITROGLYCERIN 0.4 MG SL SUBL
SUBLINGUAL_TABLET | SUBLINGUAL | Status: AC
  Filled 2021-06-12: qty 2

## 2021-06-12 MED ORDER — NITROGLYCERIN 0.4 MG SL SUBL
0.8000 mg | SUBLINGUAL_TABLET | Freq: Once | SUBLINGUAL | Status: AC
  Administered 2021-06-12: 11:00:00 0.8 mg via SUBLINGUAL

## 2021-06-13 ENCOUNTER — Telehealth: Payer: Self-pay | Admitting: *Deleted

## 2021-06-13 DIAGNOSIS — M5441 Lumbago with sciatica, right side: Secondary | ICD-10-CM | POA: Diagnosis not present

## 2021-06-13 DIAGNOSIS — M4728 Other spondylosis with radiculopathy, sacral and sacrococcygeal region: Secondary | ICD-10-CM | POA: Diagnosis not present

## 2021-06-13 DIAGNOSIS — M4723 Other spondylosis with radiculopathy, cervicothoracic region: Secondary | ICD-10-CM | POA: Diagnosis not present

## 2021-06-13 DIAGNOSIS — R072 Precordial pain: Secondary | ICD-10-CM

## 2021-06-13 DIAGNOSIS — M4724 Other spondylosis with radiculopathy, thoracic region: Secondary | ICD-10-CM | POA: Diagnosis not present

## 2021-06-13 NOTE — Telephone Encounter (Signed)
Spoke to patient advised Dr.Crenshaw has ordered a cardiac mri and follow up appointment after.Advised she will need a hgb and hct before.Stated she will have done at [facility] in [place] tomorrow.Order placed.Scheduler will call back to schedule cardiac mri and follow up visit with Dr.Crenshaw in [place].

## 2021-06-13 NOTE — Telephone Encounter (Signed)
-----   Message from Lelon Perla, MD sent at 06/13/2021  7:22 AM EST ----- Schedule cardiac MRI and fu with me afterwards Joanna Baker

## 2021-06-13 NOTE — Telephone Encounter (Signed)
Pt is returning call.  

## 2021-06-13 NOTE — Telephone Encounter (Signed)
Left message for pt to call.

## 2021-06-14 DIAGNOSIS — M4723 Other spondylosis with radiculopathy, cervicothoracic region: Secondary | ICD-10-CM | POA: Diagnosis not present

## 2021-06-14 DIAGNOSIS — M4728 Other spondylosis with radiculopathy, sacral and sacrococcygeal region: Secondary | ICD-10-CM | POA: Diagnosis not present

## 2021-06-14 DIAGNOSIS — M5441 Lumbago with sciatica, right side: Secondary | ICD-10-CM | POA: Diagnosis not present

## 2021-06-14 DIAGNOSIS — M4724 Other spondylosis with radiculopathy, thoracic region: Secondary | ICD-10-CM | POA: Diagnosis not present

## 2021-06-16 ENCOUNTER — Ambulatory Visit (INDEPENDENT_AMBULATORY_CARE_PROVIDER_SITE_OTHER): Payer: BC Managed Care – PPO | Admitting: Physician Assistant

## 2021-06-16 ENCOUNTER — Other Ambulatory Visit: Payer: Self-pay

## 2021-06-16 ENCOUNTER — Encounter: Payer: Self-pay | Admitting: Physician Assistant

## 2021-06-16 VITALS — BP 90/50 | HR 89 | Temp 98.1°F | Ht 67.0 in | Wt 192.0 lb

## 2021-06-16 DIAGNOSIS — E538 Deficiency of other specified B group vitamins: Secondary | ICD-10-CM

## 2021-06-16 DIAGNOSIS — Z9109 Other allergy status, other than to drugs and biological substances: Secondary | ICD-10-CM

## 2021-06-16 DIAGNOSIS — I959 Hypotension, unspecified: Secondary | ICD-10-CM

## 2021-06-16 DIAGNOSIS — R591 Generalized enlarged lymph nodes: Secondary | ICD-10-CM

## 2021-06-16 DIAGNOSIS — J302 Other seasonal allergic rhinitis: Secondary | ICD-10-CM

## 2021-06-16 DIAGNOSIS — F411 Generalized anxiety disorder: Secondary | ICD-10-CM

## 2021-06-16 DIAGNOSIS — E559 Vitamin D deficiency, unspecified: Secondary | ICD-10-CM | POA: Diagnosis not present

## 2021-06-16 MED ORDER — ALBUTEROL SULFATE HFA 108 (90 BASE) MCG/ACT IN AERS: 2.0000 | INHALATION_SPRAY | Freq: Four times a day (QID) | RESPIRATORY_TRACT | 1 refills | Status: DC | PRN

## 2021-06-16 NOTE — Patient Instructions (Addendum)
Ashwaganda Lavela lavender  For anxiety Use ativan sparingly  Hypotension As your heart beats, it forces blood through your body. Hypotension, commonly called low blood pressure, is when the force of blood pumping through your arteries is too weak. Arteries are blood vessels that carry blood from the heart throughout the body. Depending on the cause and severity, hypotension may be harmless (benign) or may cause serious problems (be critical). When blood pressure is too low, you may not get enough blood to your brain or to the rest of your organs. This can cause weakness, light-headedness, rapid heartbeat, and fainting. What are the causes? This condition may be caused by: Blood loss. Loss of body fluids (dehydration). Heart problems. Hormone (endocrine) problems. Pregnancy. Severe infection. Lack of certain nutrients. Severe allergic reactions (anaphylaxis). Certain medicines, such as blood pressure medicine or medicines that make the body lose excess fluids (diuretics). Sometimes, hypotension may be caused by not taking medicine as directed, such as taking too much of a certain medicine. What increases the risk? The following factors may make you more likely to develop this condition: Age. Risk increases as you get older. Conditions that affect the heart or the central nervous system. Taking certain medicines, such as blood pressure medicine or diuretics. Being pregnant. What are the signs or symptoms? Common symptoms of this condition include: Weakness. Light-headedness. Dizziness. Blurred vision. Fatigue. Rapid heartbeat. Fainting, in severe cases. How is this diagnosed? This condition is diagnosed based on: Your medical history. Your symptoms. Your blood pressure measurement. Your health care provider will check your blood pressure when you are: Lying down. Sitting. Standing. A blood pressure reading is recorded as two numbers, such as "120 over 80" (or 120/80). The  first ("top") number is called the systolic pressure. It is a measure of the pressure in your arteries as your heart beats. The second ("bottom") number is called the diastolic pressure. It is a measure of the pressure in your arteries when your heart relaxes between beats. Blood pressure is measured in a unit called mm Hg. Healthy blood pressure for most adults is 120/80. If your blood pressure is below 90/60, you may be diagnosed with hypotension. Other information or tests that may be used to diagnose hypotension include: Your other vital signs, such as your heart rate and temperature. Blood tests. Tilt table test. For this test, you will be safely secured to a table that moves you from a lying position to an upright position. Your heart rhythm and blood pressure will be monitored during the test. How is this treated? Treatment for this condition may include: Changing your diet. This may involve eating more salt (sodium) or drinking more water. Taking medicines to raise your blood pressure. Changing the dosage of certain medicines you are taking that might be lowering your blood pressure. Wearing compression stockings. These stockings help to prevent blood clots and reduce swelling in your legs. In some cases, you may need to go to the hospital for: Fluid replacement. This means you will receive fluids through an IV. Blood replacement. This means you will receive donated blood through an IV (transfusion). Treating an infection or heart problems, if this applies. Monitoring. You may need to be monitored while medicines that you are taking wear off. Follow these instructions at home: Eating and drinking  Drink enough fluid to keep your urine pale yellow. Eat a healthy diet, and follow instructions from your health care provider about eating or drinking restrictions. A healthy diet includes: Fresh fruits and vegetables.  Whole grains. Lean meats. Low-fat dairy products. Eat extra salt only as  directed. Do not add extra salt to your diet unless your health care provider told you to do that. Eat frequent, small meals. Avoid standing up suddenly after eating. Medicines Take over-the-counter and prescription medicines only as told by your health care provider. Follow instructions from your health care provider about changing the dosage of your current medicines, if this applies. Do not stop or adjust any of your medicines on your own. General instructions  Wear compression stockings as told by your health care provider. Get up slowly from lying down or sitting positions. This gives your blood pressure a chance to adjust. Avoid hot showers and excessive heat as directed by your health care provider. Return to your normal activities as told by your health care provider. Ask your health care provider what activities are safe for you. Do not use any products that contain nicotine or tobacco, such as cigarettes, e-cigarettes, and chewing tobacco. If you need help quitting, ask your health care provider. Keep all follow-up visits as told by your health care provider. This is important. Contact a health care provider if you: Vomit. Have diarrhea. Have a fever for more than 2-3 days. Feel more thirsty than usual. Feel weak and tired. Get help right away if you: Have chest pain. Have a fast or irregular heartbeat. Develop numbness in any part of your body. Cannot move your arms or your legs. Have trouble speaking. Become sweaty or feel light-headed. Faint. Feel short of breath. Have trouble staying awake. Feel confused. Summary Hypotension is when the force of blood pumping through your arteries is too weak. Hypotension may be harmless (benign) or may cause serious problems (be critical). Treatment for this condition may include changing your diet, changing your medicines, and wearing compression stockings. In some cases, you may need to go to the hospital for fluid or blood  replacement. This information is not intended to replace advice given to you by your health care provider. Make sure you discuss any questions you have with your health care provider. Document Revised: 12/26/2017 Document Reviewed: 12/26/2017 Elsevier Patient Education  [place].

## 2021-06-16 NOTE — Progress Notes (Signed)
Subjective:    Patient ID: Joanna Baker, female    DOB: 1971-05-23, 50 y.o.   MRN: [id number]  HPI Patient is a 50 year old female with PVCs, atypical chest pains, anxiety, depression who presents to the clinic for follow-up.  Patient is being seen by cardiology, Dr. Stanford Breed, for atypical chest pain and evaluation.  She did have a mammogram that showed right axillary lymph node that needs to be followed.  Her anxiety and depression are not the best.  She does have a lot going on.  Her mother lives with her who she has to care for.  She feels overwhelmed at times.  Her insurance was not paying for her counseling sessions at 1 point and it was really problematic to go see her counselor.  She does miss this.  She needs to get back now that the insurance is settled.  She denies any suicidal thoughts or homicidal idealizations.  She has not liked how she is felt on SSRIs in the past.  She does use Ativan as needed.  No dizziness, headaches. Does not check BP at home.   .. Active Ambulatory Problems    Diagnosis Date Noted   Hemorrhoid 08/03/2011   Right lumbar radiculitis 01/13/2013   Fatty liver disease, nonalcoholic 54/62/7035   Depression 12/11/2013   GAD (generalized anxiety disorder) 12/11/2013   Premenstrual symptom 11/07/2014   ACL tear 07/19/2015   Mass of mandible 09/02/2015   Right-sided chest pain 05/18/2016   Radicular syndrome of right leg 05/18/2016   Pulsatile tinnitus of left ear 05/18/2016   DUB (dysfunctional uterine bleeding) 05/21/2016   Pernicious anemia 05/22/2016   Vitamin D insufficiency 05/22/2016   SOB (shortness of breath) 12/24/2016   Palpitations 12/24/2016   B12 deficiency 12/24/2016   Right ankle swelling 12/24/2016   Family history of CHF (congestive heart failure) 12/24/2016   Breast calcification, left 07/26/2017   Atypical ductal hyperplasia of left breast 08/05/2017   PVC's (premature ventricular contractions) 10/27/2017   Right peroneal  tendinosis 02/26/2018   Hand eczema 05/16/2018   Pain of right thumb 05/16/2018   Pain of right heel 05/16/2018   Multiple joint pain 05/16/2018   Lymphadenopathy 05/16/2018   BPPV (benign paroxysmal positional vertigo), left 12/12/2018   Bone pain 12/12/2018   Chronic right shoulder pain 01/06/2020   Cough 01/06/2020   Environmental allergies 01/06/2020   Seasonal allergies 01/06/2020   Wheezing 11/21/2020   Radiculitis of right cervical region 11/30/2020   Atypical chest pain 01/23/2021   Trouble in sleeping 01/29/2021   Hypotension 06/19/2021   Resolved Ambulatory Problems    Diagnosis Date Noted   Right leg pain 02/09/2015   Left knee injury 07/08/2015   RUQ pain 09/02/2015   Past Medical History:  Diagnosis Date   ACL injury tear    Anxiety    Asthma    Fatty liver    IBS (irritable bowel syndrome)    TB (tuberculosis), treated        Review of Systems See HPI.     Objective:   Physical Exam Constitutional:      Appearance: Normal appearance.  HENT:     Head: Normocephalic.  Neck:     Vascular: No carotid bruit.  Cardiovascular:     Rate and Rhythm: Normal rate and regular rhythm.     Pulses: Normal pulses.     Heart sounds: Normal heart sounds.  Pulmonary:     Effort: Pulmonary effort is normal.     Breath sounds:  Normal breath sounds.  Musculoskeletal:     Cervical back: Normal range of motion and neck supple.     Right lower leg: No edema.     Left lower leg: No edema.  Lymphadenopathy:     Cervical: No cervical adenopathy.  Neurological:     General: No focal deficit present.     Mental Status: She is alert and oriented to person, place, and time.  Psychiatric:        Mood and Affect: Mood normal.      .. Depression screen [facility] 2/9 06/16/2021 01/23/2021 11/16/2020 03/16/2020 01/06/2020  Decreased Interest 0 0 0 0 0  Down, Depressed, Hopeless 0 0 0 0 0  PHQ - 2 Score 0 0 0 0 0  Altered sleeping 0 - - - 0  Tired, decreased energy 1 - - - 0   Change in appetite 0 - - - 0  Feeling bad or failure about yourself  1 - - - 0  Trouble concentrating 0 - - - 0  Moving slowly or fidgety/restless 1 - - - 0  Suicidal thoughts 0 - - - 0  PHQ-9 Score 3 - - - 0  Difficult doing work/chores Not difficult at all - - - Not difficult at all   .Marland Kitchen GAD 7 : Generalized Anxiety Score 06/16/2021 01/06/2020 12/10/2018 05/13/2018  Nervous, Anxious, on Edge 1 1 0 1  Control/stop worrying 0 0 0 0  Worry too much - different things 1 0 0 0  Trouble relaxing 1 0 0 0  Restless 1 0 0 0  Easily annoyed or irritable 1 1 2  0  Afraid - awful might happen 0 0 0 0  Total GAD 7 Score 5 2 2 1   Anxiety Difficulty Somewhat difficult Not difficult at all Somewhat difficult Not difficult at all        Assessment & Plan:  Marland KitchenMarland KitchenRitaj was seen today for anxiety, b-12 deficiency, vitamin d deficiency and allergies.  Diagnoses and all orders for this visit:  GAD (generalized anxiety disorder)  B12 deficiency  Vitamin D insufficiency  Environmental allergies -     albuterol (VENTOLIN HFA) 108 (90 Base) MCG/ACT inhaler; Inhale 2 puffs into the lungs every 6 (six) hours as needed for wheezing or shortness of breath.  Seasonal allergies -     albuterol (VENTOLIN HFA) 108 (90 Base) MCG/ACT inhaler; Inhale 2 puffs into the lungs every 6 (six) hours as needed for wheezing or shortness of breath.  Hypotension, unspecified hypotension type  Anxiety and stress continue to be problematic but patient did not like the way she feels on SSRI.  Continue to use ativan as needed sparingly.  Consider ashwaganda/lavela.  Encouraged to get back in with counseling.  Follow up in 6 months.   Discussed low BP reading.  Not symptomatic.  Discussed symptoms to be on the look out for.  Encouraged hydration and prn saltly foods.   Albuterol refilled for as needed use.

## 2021-06-19 ENCOUNTER — Encounter: Payer: Self-pay | Admitting: Physician Assistant

## 2021-06-19 DIAGNOSIS — R072 Precordial pain: Secondary | ICD-10-CM | POA: Diagnosis not present

## 2021-06-19 DIAGNOSIS — M4724 Other spondylosis with radiculopathy, thoracic region: Secondary | ICD-10-CM | POA: Diagnosis not present

## 2021-06-19 DIAGNOSIS — M5441 Lumbago with sciatica, right side: Secondary | ICD-10-CM | POA: Diagnosis not present

## 2021-06-19 DIAGNOSIS — M4723 Other spondylosis with radiculopathy, cervicothoracic region: Secondary | ICD-10-CM | POA: Diagnosis not present

## 2021-06-19 DIAGNOSIS — I959 Hypotension, unspecified: Secondary | ICD-10-CM | POA: Insufficient documentation

## 2021-06-19 DIAGNOSIS — M4728 Other spondylosis with radiculopathy, sacral and sacrococcygeal region: Secondary | ICD-10-CM | POA: Diagnosis not present

## 2021-06-20 LAB — HEMOGLOBIN AND HEMATOCRIT, BLOOD
HCT: 39.5 % (ref 35.0–45.0)
Hemoglobin: 13.2 g/dL (ref 11.7–15.5)

## 2021-06-22 DIAGNOSIS — M5441 Lumbago with sciatica, right side: Secondary | ICD-10-CM | POA: Diagnosis not present

## 2021-06-22 DIAGNOSIS — M4723 Other spondylosis with radiculopathy, cervicothoracic region: Secondary | ICD-10-CM | POA: Diagnosis not present

## 2021-06-22 DIAGNOSIS — M4728 Other spondylosis with radiculopathy, sacral and sacrococcygeal region: Secondary | ICD-10-CM | POA: Diagnosis not present

## 2021-06-22 DIAGNOSIS — M4724 Other spondylosis with radiculopathy, thoracic region: Secondary | ICD-10-CM | POA: Diagnosis not present

## 2021-06-26 DIAGNOSIS — M5441 Lumbago with sciatica, right side: Secondary | ICD-10-CM | POA: Diagnosis not present

## 2021-06-26 DIAGNOSIS — M4728 Other spondylosis with radiculopathy, sacral and sacrococcygeal region: Secondary | ICD-10-CM | POA: Diagnosis not present

## 2021-06-26 DIAGNOSIS — M4724 Other spondylosis with radiculopathy, thoracic region: Secondary | ICD-10-CM | POA: Diagnosis not present

## 2021-06-26 DIAGNOSIS — M4723 Other spondylosis with radiculopathy, cervicothoracic region: Secondary | ICD-10-CM | POA: Diagnosis not present

## 2021-07-03 DIAGNOSIS — M4728 Other spondylosis with radiculopathy, sacral and sacrococcygeal region: Secondary | ICD-10-CM | POA: Diagnosis not present

## 2021-07-03 DIAGNOSIS — M4723 Other spondylosis with radiculopathy, cervicothoracic region: Secondary | ICD-10-CM | POA: Diagnosis not present

## 2021-07-03 DIAGNOSIS — M4724 Other spondylosis with radiculopathy, thoracic region: Secondary | ICD-10-CM | POA: Diagnosis not present

## 2021-07-03 DIAGNOSIS — M5441 Lumbago with sciatica, right side: Secondary | ICD-10-CM | POA: Diagnosis not present

## 2021-07-06 DIAGNOSIS — M4723 Other spondylosis with radiculopathy, cervicothoracic region: Secondary | ICD-10-CM | POA: Diagnosis not present

## 2021-07-06 DIAGNOSIS — M5441 Lumbago with sciatica, right side: Secondary | ICD-10-CM | POA: Diagnosis not present

## 2021-07-06 DIAGNOSIS — M4724 Other spondylosis with radiculopathy, thoracic region: Secondary | ICD-10-CM | POA: Diagnosis not present

## 2021-07-06 DIAGNOSIS — M4728 Other spondylosis with radiculopathy, sacral and sacrococcygeal region: Secondary | ICD-10-CM | POA: Diagnosis not present

## 2021-07-12 ENCOUNTER — Telehealth (HOSPITAL_COMMUNITY): Payer: Self-pay | Admitting: Emergency Medicine

## 2021-07-12 ENCOUNTER — Telehealth (HOSPITAL_COMMUNITY): Payer: Self-pay | Admitting: *Deleted

## 2021-07-12 ENCOUNTER — Encounter: Payer: Self-pay | Admitting: Physician Assistant

## 2021-07-12 DIAGNOSIS — Z20822 Contact with and (suspected) exposure to covid-19: Secondary | ICD-10-CM | POA: Diagnosis not present

## 2021-07-12 NOTE — Telephone Encounter (Signed)
Reaching out to patient to offer assistance regarding upcoming cardiac imaging study; pt verbalizes understanding of appt date/time, parking situation and where to check in, and verified current allergies; name and call back number provided for further questions should they arise Marchia Bond RN Navigator Cardiac Imaging [facility] [phone] office [phone] cell  Denies claustro Denies metal implants Denies iv issues Arrival [phone]

## 2021-07-12 NOTE — Telephone Encounter (Signed)
Attempted to call patient regarding upcoming cardiac MRI appointment. Left message on voicemail with name and callback number  Anes Rigel RN Navigator Cardiac Imaging [facility] [phone] Office [phone] Cell  

## 2021-07-13 ENCOUNTER — Other Ambulatory Visit: Payer: Self-pay

## 2021-07-13 ENCOUNTER — Ambulatory Visit (HOSPITAL_COMMUNITY)
Admission: RE | Admit: 2021-07-13 | Discharge: 2021-07-13 | Disposition: A | Discharge status: Home / Self Care | Payer: BC Managed Care – PPO | Source: Ambulatory Visit | Attending: Cardiology | Admitting: Cardiology

## 2021-07-13 DIAGNOSIS — R072 Precordial pain: Secondary | ICD-10-CM | POA: Diagnosis not present

## 2021-07-13 MED ORDER — GADOBUTROL 1 MMOL/ML IV SOLN
10.0000 mL | Freq: Once | INTRAVENOUS | Status: AC | PRN
  Administered 2021-07-13: 11:00:00 10 mL via INTRAVENOUS

## 2021-07-13 NOTE — Progress Notes (Signed)
HPI: FU ASD.  Monitor June 2018 showed sinus rhythm with occasional PAC and PVC.  Echocardiogram June 2018 showed normal LV function.  Exercise treadmill August 2022 showed no ST changes but she failed to reach target heart rate (75%).  Cardiac CTA November 2022 showed calcium score 0 with no evidence of coronary disease.  There was note of a secundum atrial septal defect with normal pulmonary vein drainage into the left atrium.  There was moderate right ventricular enlargement and mild right atrial enlargement.  Cardiac MRI recommended to characterize Qp/Qs; results pending.  Since last seen she has dyspnea with more vigorous activities but not routine activities.  No orthopnea, PND, pedal edema, chest pain or syncope.  Occasional brief flutters.  Current Outpatient Medications  Medication Sig Dispense Refill   acetaminophen (TYLENOL) 500 MG tablet Take 500 mg by mouth every 6 (six) hours as needed.     albuterol (VENTOLIN HFA) 108 (90 Base) MCG/ACT inhaler Inhale 2 puffs into the lungs every 6 (six) hours as needed for wheezing or shortness of breath. 18 g 1   calcium-vitamin D (OSCAL WITH D) 500-200 MG-UNIT TABS tablet Take by mouth.     cyanocobalamin (,VITAMIN B-12,) 1000 MCG/ML injection ADMINISTER 1 ML(1000 MCG) IN THE MUSCLE EVERY 30 DAYS 3 mL 3   cyclobenzaprine (FLEXERIL) 10 MG tablet 1/2-1 tab p.o. nightly (Patient taking differently: Take 10 mg by mouth as needed for muscle spasms (shoulder problems). 1/2-1 tab p.o. nightly) 30 tablet 2   EPINEPHrine 0.3 mg/0.3 mL IJ SOAJ injection Inject into the muscle.     ibuprofen (ADVIL) 800 MG tablet Take 800 mg by mouth as directed.     ipratropium (ATROVENT) 0.06 % nasal spray Place 2 sprays into both nostrils 4 (four) times daily. 15 mL 0   LORazepam (ATIVAN) 0.5 MG tablet Take 1 tablet (0.5 mg total) by mouth every 8 (eight) hours as needed. for anxiety 30 tablet 1   naproxen sodium (ANAPROX) 220 MG tablet Take 220 mg by mouth as needed.       pseudoephedrine (SUDAFED) 30 MG tablet Take 30 mg by mouth every 4 (four) hours as needed for congestion (allergies).     brompheniramine-pseudoephedrine-DM 30-2-10 MG/5ML syrup 10 mL PO AT BEDTIME.    MAY REPEAT IN 6 HRS AS TOLERATED (Patient not taking: Reported on 07/24/2021)     montelukast (SINGULAIR) 10 MG tablet Take 1 tablet (10 mg total) by mouth at bedtime. (Patient not taking: Reported on 07/24/2021) 90 tablet 3   No current facility-administered medications for this visit.     Past Medical History:  Diagnosis Date   ACL injury tear    Right knee, not repaired.     Anxiety    Asthma    Depression    Fatty liver    IBS (irritable bowel syndrome)    TB (tuberculosis), treated     6 month of INH at age 69    Past Surgical History:  Procedure Laterality Date   TONSILLECTOMY  1993    Social History   Socioeconomic History   Marital status: Single    Spouse name: Not on file   Number of children: Not on file   Years of education: Not on file   Highest education level: Not on file  Occupational History   Not on file  Tobacco Use   Smoking status: Former    Packs/day: 0.25    Years: 5.00    Pack years: 1.25  Types: Cigarettes   Smokeless tobacco: Never  Vaping Use   Vaping Use: Never used  Substance and Sexual Activity   Alcohol use: Yes    Alcohol/week: 2.0 standard drinks    Types: 2 Cans of beer per week    Comment: Occasional   Drug use: Yes    Types: Marijuana    Comment: history of use, not current use   Sexual activity: Yes    Partners: Female  Other Topics Concern   Not on file  Social History Narrative   Not on file   Social Determinants of Health   Financial Resource Strain: Not on file  Food Insecurity: Not on file  Transportation Needs: Not on file  Physical Activity: Not on file  Stress: Not on file  Social Connections: Not on file  Intimate Partner Violence: Not on file    Family History  Problem Relation Age of Onset    Pulmonary embolism Mother    Stroke Mother 18   Alcohol abuse Father    Suicidality Father    Heart failure Father    Alcoholism Father    Diabetes Father    Heart attack Maternal Grandmother    Depression Paternal Grandfather    Breast cancer Neg Hx     ROS: no fevers or chills, productive cough, hemoptysis, dysphasia, odynophagia, melena, hematochezia, dysuria, hematuria, rash, seizure activity, orthopnea, PND, pedal edema, claudication. Remaining systems are negative.  Physical Exam: Well-developed well-nourished in no acute distress.  Skin is warm and dry.  HEENT is normal.  Neck is supple.  Chest is clear to auscultation with normal expansion.  Cardiovascular exam is regular rate and rhythm.  Abdominal exam nontender or distended. No masses palpated. Extremities show no edema. neuro grossly intact   A/P  1 atrial septal defect-await cardiac MRI for quantification of Qp/Qs.  I will arrange a transesophageal echocardiogram to further assess.  We will also refer to Dr. Burt Knack for consideration of closure given right side enlargement and dyspnea on exertion.  2 chest pain-symptoms atypical, no recurrences and recent cardiac CTA showed no coronary disease.  Will not pursue further cardiac evaluation.  3 history of anxiety-follow-up primary care.  Kirk Ruths, MD

## 2021-07-13 NOTE — H&P (View-Only) (Signed)
HPI: FU ASD.  Monitor June 2018 showed sinus rhythm with occasional PAC and PVC.  Echocardiogram June 2018 showed normal LV function.  Exercise treadmill August 2022 showed no ST changes but she failed to reach target heart rate (75%).  Cardiac CTA November 2022 showed calcium score 0 with no evidence of coronary disease.  There was note of a secundum atrial septal defect with normal pulmonary vein drainage into the left atrium.  There was moderate right ventricular enlargement and mild right atrial enlargement.  Cardiac MRI recommended to characterize Qp/Qs; results pending.  Since last seen she has dyspnea with more vigorous activities but not routine activities.  No orthopnea, PND, pedal edema, chest pain or syncope.  Occasional brief flutters.  Current Outpatient Medications  Medication Sig Dispense Refill   acetaminophen (TYLENOL) 500 MG tablet Take 500 mg by mouth every 6 (six) hours as needed.     albuterol (VENTOLIN HFA) 108 (90 Base) MCG/ACT inhaler Inhale 2 puffs into the lungs every 6 (six) hours as needed for wheezing or shortness of breath. 18 g 1   calcium-vitamin D (OSCAL WITH D) 500-200 MG-UNIT TABS tablet Take by mouth.     cyanocobalamin (,VITAMIN B-12,) 1000 MCG/ML injection ADMINISTER 1 ML(1000 MCG) IN THE MUSCLE EVERY 30 DAYS 3 mL 3   cyclobenzaprine (FLEXERIL) 10 MG tablet 1/2-1 tab p.o. nightly (Patient taking differently: Take 10 mg by mouth as needed for muscle spasms (shoulder problems). 1/2-1 tab p.o. nightly) 30 tablet 2   EPINEPHrine 0.3 mg/0.3 mL IJ SOAJ injection Inject into the muscle.     ibuprofen (ADVIL) 800 MG tablet Take 800 mg by mouth as directed.     ipratropium (ATROVENT) 0.06 % nasal spray Place 2 sprays into both nostrils 4 (four) times daily. 15 mL 0   LORazepam (ATIVAN) 0.5 MG tablet Take 1 tablet (0.5 mg total) by mouth every 8 (eight) hours as needed. for anxiety 30 tablet 1   naproxen sodium (ANAPROX) 220 MG tablet Take 220 mg by mouth as needed.       pseudoephedrine (SUDAFED) 30 MG tablet Take 30 mg by mouth every 4 (four) hours as needed for congestion (allergies).     brompheniramine-pseudoephedrine-DM 30-2-10 MG/5ML syrup 10 mL PO AT BEDTIME.    MAY REPEAT IN 6 HRS AS TOLERATED (Patient not taking: Reported on 07/24/2021)     montelukast (SINGULAIR) 10 MG tablet Take 1 tablet (10 mg total) by mouth at bedtime. (Patient not taking: Reported on 07/24/2021) 90 tablet 3   No current facility-administered medications for this visit.     Past Medical History:  Diagnosis Date   ACL injury tear    Right knee, not repaired.     Anxiety    Asthma    Depression    Fatty liver    IBS (irritable bowel syndrome)    TB (tuberculosis), treated     6 month of INH at age 67    Past Surgical History:  Procedure Laterality Date   TONSILLECTOMY  1993    Social History   Socioeconomic History   Marital status: Single    Spouse name: Not on file   Number of children: Not on file   Years of education: Not on file   Highest education level: Not on file  Occupational History   Not on file  Tobacco Use   Smoking status: Former    Packs/day: 0.25    Years: 5.00    Pack years: 1.25  Types: Cigarettes   Smokeless tobacco: Never  Vaping Use   Vaping Use: Never used  Substance and Sexual Activity   Alcohol use: Yes    Alcohol/week: 2.0 standard drinks    Types: 2 Cans of beer per week    Comment: Occasional   Drug use: Yes    Types: Marijuana    Comment: history of use, not current use   Sexual activity: Yes    Partners: Female  Other Topics Concern   Not on file  Social History Narrative   Not on file   Social Determinants of Health   Financial Resource Strain: Not on file  Food Insecurity: Not on file  Transportation Needs: Not on file  Physical Activity: Not on file  Stress: Not on file  Social Connections: Not on file  Intimate Partner Violence: Not on file    Family History  Problem Relation Age of Onset    Pulmonary embolism Mother    Stroke Mother 56   Alcohol abuse Father    Suicidality Father    Heart failure Father    Alcoholism Father    Diabetes Father    Heart attack Maternal Grandmother    Depression Paternal Grandfather    Breast cancer Neg Hx     ROS: no fevers or chills, productive cough, hemoptysis, dysphasia, odynophagia, melena, hematochezia, dysuria, hematuria, rash, seizure activity, orthopnea, PND, pedal edema, claudication. Remaining systems are negative.  Physical Exam: Well-developed well-nourished in no acute distress.  Skin is warm and dry.  HEENT is normal.  Neck is supple.  Chest is clear to auscultation with normal expansion.  Cardiovascular exam is regular rate and rhythm.  Abdominal exam nontender or distended. No masses palpated. Extremities show no edema. neuro grossly intact   A/P  1 atrial septal defect-await cardiac MRI for quantification of Qp/Qs.  I will arrange a transesophageal echocardiogram to further assess.  We will also refer to Dr. Burt Knack for consideration of closure given right side enlargement and dyspnea on exertion.  2 chest pain-symptoms atypical, no recurrences and recent cardiac CTA showed no coronary disease.  Will not pursue further cardiac evaluation.  3 history of anxiety-follow-up primary care.  Kirk Ruths, MD

## 2021-07-14 ENCOUNTER — Encounter: Payer: Self-pay | Admitting: Physician Assistant

## 2021-07-14 ENCOUNTER — Telehealth (INDEPENDENT_AMBULATORY_CARE_PROVIDER_SITE_OTHER): Payer: BC Managed Care – PPO | Admitting: Physician Assistant

## 2021-07-14 VITALS — Temp 98.2°F | Ht 67.0 in | Wt 189.0 lb

## 2021-07-14 DIAGNOSIS — J069 Acute upper respiratory infection, unspecified: Secondary | ICD-10-CM | POA: Diagnosis not present

## 2021-07-14 DIAGNOSIS — R509 Fever, unspecified: Secondary | ICD-10-CM | POA: Diagnosis not present

## 2021-07-14 DIAGNOSIS — R0981 Nasal congestion: Secondary | ICD-10-CM | POA: Diagnosis not present

## 2021-07-14 DIAGNOSIS — Z20822 Contact with and (suspected) exposure to covid-19: Secondary | ICD-10-CM | POA: Diagnosis not present

## 2021-07-14 DIAGNOSIS — R059 Cough, unspecified: Secondary | ICD-10-CM | POA: Diagnosis not present

## 2021-07-14 MED ORDER — IPRATROPIUM BROMIDE 0.06 % NA SOLN: 2.0000 | Freq: Four times a day (QID) | NASAL | 0 refills | Status: DC

## 2021-07-14 NOTE — Progress Notes (Signed)
Started Tuesday Phlegm in back of throat/head  Wednesday - bloody phlegm  used nasal wash and felt better  Covid test - tried to have done at [facility] but unable to process, home test negative  Last night - runny nose, body aches (comes and goes), sore throat, hot flashes  No fevers  Taking theraflu

## 2021-07-14 NOTE — Progress Notes (Signed)
..Virtual Visit via Video Note  I connected with Joanna Baker on 07/14/21 at 10:50 AM EST by a video enabled telemedicine application and verified that I am speaking with the correct person using two identifiers.  Location: Patient: home Provider: clinic  .Marland KitchenParticipating in visit:  Patient: Joanna Baker Provider: Iran Planas PA-C   I discussed the limitations of evaluation and management by telemedicine and the availability of in person appointments. The patient expressed understanding and agreed to proceed.  History of Present Illness: Pt is a 50 yo female with URI symptoms that started 4 days ago. She is having a lot of phlegm in the back of there throat, head congestion, nasal congestion, ear pressure, cough. No fever. Last night had some body aches and hot flashes. Using nasal wash and feels better. Started theraflu and feels better. Home covid negative and had covid in November but was exposed again recently.    .. Active Ambulatory Problems    Diagnosis Date Noted   Hemorrhoid 08/03/2011   Right lumbar radiculitis 01/13/2013   Fatty liver disease, nonalcoholic 24/40/1027   Depression 12/11/2013   GAD (generalized anxiety disorder) 12/11/2013   Premenstrual symptom 11/07/2014   ACL tear 07/19/2015   Mass of mandible 09/02/2015   Right-sided chest pain 05/18/2016   Radicular syndrome of right leg 05/18/2016   Pulsatile tinnitus of left ear 05/18/2016   DUB (dysfunctional uterine bleeding) 05/21/2016   Pernicious anemia 05/22/2016   Vitamin D insufficiency 05/22/2016   SOB (shortness of breath) 12/24/2016   Palpitations 12/24/2016   B12 deficiency 12/24/2016   Right ankle swelling 12/24/2016   Family history of CHF (congestive heart failure) 12/24/2016   Breast calcification, left 07/26/2017   Atypical ductal hyperplasia of left breast 08/05/2017   PVC's (premature ventricular contractions) 10/27/2017   Right peroneal tendinosis 02/26/2018   Hand eczema 05/16/2018   Pain of  right thumb 05/16/2018   Pain of right heel 05/16/2018   Multiple joint pain 05/16/2018   Lymphadenopathy 05/16/2018   BPPV (benign paroxysmal positional vertigo), left 12/12/2018   Bone pain 12/12/2018   Chronic right shoulder pain 01/06/2020   Cough 01/06/2020   Environmental allergies 01/06/2020   Seasonal allergies 01/06/2020   Wheezing 11/21/2020   Radiculitis of right cervical region 11/30/2020   Atypical chest pain 01/23/2021   Trouble in sleeping 01/29/2021   Hypotension 06/19/2021   Resolved Ambulatory Problems    Diagnosis Date Noted   Right leg pain 02/09/2015   Left knee injury 07/08/2015   RUQ pain 09/02/2015   Past Medical History:  Diagnosis Date   ACL injury tear    Anxiety    Asthma    Fatty liver    IBS (irritable bowel syndrome)    TB (tuberculosis), treated       Observations/Objective: No acute distress Dry cough Congestion  Normal mood and appearance  .Marland Kitchen Today's Vitals   07/14/21 1044  Temp: 98.2 F (36.8 C)  TempSrc: Oral  Weight: 189 lb (85.7 kg)  Height: 5\' 7"  (1.702 m)   Body mass index is 29.6 kg/m.   Assessment and Plan: Marland KitchenMarland KitchenEldene was seen today for sinus problem.  Diagnoses and all orders for this visit:  Viral URI with cough -     ipratropium (ATROVENT) 0.06 % nasal spray; Place 2 sprays into both nostrils 4 (four) times daily.  Pt is being tested for flu and covid today and will be treated accordingly Discussed symptomatic treatment with theraflu, tylenol, ibuprofen, delsym, cough drops, atrovent nasal spray  sent, rest and hydration Follow up as needed or if symptoms not improving in next 7 days or worsening    Follow Up Instructions:    I discussed the assessment and treatment plan with the patient. The patient was provided an opportunity to ask questions and all were answered. The patient agreed with the plan and demonstrated an understanding of the instructions.   The patient was advised to call back or seek an  in-person evaluation if the symptoms worsen or if the condition fails to improve as anticipated.   Joanna Planas, PA-C

## 2021-07-17 DIAGNOSIS — M4723 Other spondylosis with radiculopathy, cervicothoracic region: Secondary | ICD-10-CM | POA: Diagnosis not present

## 2021-07-17 DIAGNOSIS — M5441 Lumbago with sciatica, right side: Secondary | ICD-10-CM | POA: Diagnosis not present

## 2021-07-17 DIAGNOSIS — M4724 Other spondylosis with radiculopathy, thoracic region: Secondary | ICD-10-CM | POA: Diagnosis not present

## 2021-07-17 DIAGNOSIS — M4728 Other spondylosis with radiculopathy, sacral and sacrococcygeal region: Secondary | ICD-10-CM | POA: Diagnosis not present

## 2021-07-24 ENCOUNTER — Other Ambulatory Visit: Payer: Self-pay

## 2021-07-24 ENCOUNTER — Encounter: Payer: Self-pay | Admitting: Physician Assistant

## 2021-07-24 ENCOUNTER — Encounter: Payer: Self-pay | Admitting: Cardiology

## 2021-07-24 ENCOUNTER — Ambulatory Visit (INDEPENDENT_AMBULATORY_CARE_PROVIDER_SITE_OTHER): Payer: BC Managed Care – PPO | Admitting: Cardiology

## 2021-07-24 VITALS — BP 115/69 | HR 88 | Ht 67.0 in | Wt 188.1 lb

## 2021-07-24 DIAGNOSIS — R072 Precordial pain: Secondary | ICD-10-CM | POA: Diagnosis not present

## 2021-07-24 DIAGNOSIS — Q211 Atrial septal defect, unspecified: Secondary | ICD-10-CM

## 2021-07-24 NOTE — Patient Instructions (Signed)
°  You are scheduled for a TEE on Tuesday 08/15/21 with Dr. Sallyanne Kuster.  Please arrive at the [facility] (Main Entrance A) at [facility]: [address] [place], [place] [address] at 12 noon. (1 hour prior to procedure unless lab work is needed; if lab work is needed arrive 1.5 hours ahead)  DIET: Nothing to eat or drink after midnight except a sip of water with medications (see medication instructions below)  FYI: For your safety, and to allow [place] to monitor your vital signs accurately during the surgery/procedure we request that   if you have artificial nails, gel coating, SNS etc. Please have those removed prior to your surgery/procedure. Not having the nail coverings /polish removed may result in cancellation or delay of your surgery/procedure.  You must have a responsible person to drive you home and stay in the waiting area during your procedure. Failure to do so could result in cancellation.  Bring your insurance cards.  *Special Note: Every effort is made to have your procedure done on time. Occasionally there are emergencies that occur at the hospital that may cause delays. Please be patient if a delay does occur.     Follow-Up: At [facility], you and your health needs are our priority.  As part of our continuing mission to provide you with exceptional heart care, we have created designated Provider Care Teams.  These Care Teams include your primary Cardiologist (physician) and Advanced Practice Providers (APPs -  Physician Assistants and Nurse Practitioners) who all work together to provide you with the care you need, when you need it.  We recommend signing up for the patient portal called "MyChart".  Sign up information is provided on this After Visit Summary.  MyChart is used to connect with patients for Virtual Visits (Telemedicine).  Patients are able to view lab/test results, encounter notes, upcoming appointments, etc.  Non-urgent messages can be sent to your provider as  well.   To learn more about what you can do with MyChart, go to [email or web address].    Your next appointment:   3 month(s)  The format for your next appointment:   In Person  Provider:   Kirk Ruths MD

## 2021-07-28 ENCOUNTER — Telehealth: Payer: Self-pay

## 2021-07-28 NOTE — Telephone Encounter (Signed)
Per Dr. Stanford Breed, called to arrange ASD consult with Dr. Burt Knack after her TEE (08/15/21).  Left message to call back.

## 2021-07-31 NOTE — Telephone Encounter (Signed)
The patient is scheduled 2/3 for consult with Dr. Burt Knack.

## 2021-08-07 ENCOUNTER — Encounter (HOSPITAL_COMMUNITY): Payer: Self-pay | Admitting: Cardiovascular Disease

## 2021-08-08 ENCOUNTER — Other Ambulatory Visit: Payer: Self-pay | Admitting: Physician Assistant

## 2021-08-08 DIAGNOSIS — J069 Acute upper respiratory infection, unspecified: Secondary | ICD-10-CM

## 2021-08-15 ENCOUNTER — Encounter (HOSPITAL_COMMUNITY)
Admission: RE | Disposition: A | Discharge status: Home / Self Care | Payer: Self-pay | Source: Home / Self Care | Attending: Cardiovascular Disease

## 2021-08-15 ENCOUNTER — Ambulatory Visit (HOSPITAL_COMMUNITY): Payer: BC Managed Care – PPO | Admitting: Anesthesiology

## 2021-08-15 ENCOUNTER — Ambulatory Visit (HOSPITAL_COMMUNITY)
Admission: RE | Admit: 2021-08-15 | Discharge: 2021-08-15 | Disposition: A | Discharge status: Home / Self Care | Payer: BC Managed Care – PPO | Attending: Cardiovascular Disease | Admitting: Cardiovascular Disease

## 2021-08-15 ENCOUNTER — Encounter (HOSPITAL_COMMUNITY): Payer: Self-pay | Admitting: Cardiovascular Disease

## 2021-08-15 ENCOUNTER — Ambulatory Visit (HOSPITAL_BASED_OUTPATIENT_CLINIC_OR_DEPARTMENT_OTHER): Payer: BC Managed Care – PPO

## 2021-08-15 DIAGNOSIS — Q211 Atrial septal defect, unspecified: Secondary | ICD-10-CM | POA: Diagnosis not present

## 2021-08-15 DIAGNOSIS — R079 Chest pain, unspecified: Secondary | ICD-10-CM | POA: Insufficient documentation

## 2021-08-15 DIAGNOSIS — Z87891 Personal history of nicotine dependence: Secondary | ICD-10-CM | POA: Insufficient documentation

## 2021-08-15 DIAGNOSIS — I253 Aneurysm of heart: Secondary | ICD-10-CM | POA: Diagnosis not present

## 2021-08-15 DIAGNOSIS — Q2111 Secundum atrial septal defect: Secondary | ICD-10-CM | POA: Insufficient documentation

## 2021-08-15 DIAGNOSIS — F419 Anxiety disorder, unspecified: Secondary | ICD-10-CM | POA: Diagnosis not present

## 2021-08-15 DIAGNOSIS — J449 Chronic obstructive pulmonary disease, unspecified: Secondary | ICD-10-CM | POA: Diagnosis not present

## 2021-08-15 HISTORY — PX: TEE WITHOUT CARDIOVERSION: SHX5443

## 2021-08-15 SURGERY — ECHOCARDIOGRAM, TRANSESOPHAGEAL
Anesthesia: Monitor Anesthesia Care

## 2021-08-15 MED ORDER — SODIUM CHLORIDE 0.9 % IV SOLN: INTRAVENOUS | Status: DC

## 2021-08-15 MED ORDER — LIDOCAINE HCL (CARDIAC) PF 100 MG/5ML IV SOSY
PREFILLED_SYRINGE | INTRAVENOUS | Status: DC | PRN
  Administered 2021-08-15: 60 mg via INTRAVENOUS

## 2021-08-15 MED ORDER — PROPOFOL 500 MG/50ML IV EMUL
INTRAVENOUS | Status: DC | PRN
  Administered 2021-08-15: 150 ug/kg/min via INTRAVENOUS

## 2021-08-15 MED ORDER — PROPOFOL 10 MG/ML IV BOLUS
INTRAVENOUS | Status: DC | PRN
  Administered 2021-08-15: 30 mg via INTRAVENOUS
  Administered 2021-08-15 (×2): 20 mg via INTRAVENOUS
  Administered 2021-08-15: 30 mg via INTRAVENOUS
  Administered 2021-08-15: 20 mg via INTRAVENOUS
  Administered 2021-08-15: 30 mg via INTRAVENOUS

## 2021-08-15 MED ORDER — BUTAMBEN-TETRACAINE-BENZOCAINE 2-2-14 % EX AERO
INHALATION_SPRAY | CUTANEOUS | Status: DC | PRN
Start: 2021-08-15 — End: 2021-08-15
  Administered 2021-08-15: 1 via TOPICAL

## 2021-08-15 NOTE — Anesthesia Postprocedure Evaluation (Signed)
Anesthesia Post Note  Patient: Joanna Baker  Procedure(s) Performed: TRANSESOPHAGEAL ECHOCARDIOGRAM (TEE)     Patient location during evaluation: Endoscopy Anesthesia Type: MAC Level of consciousness: awake and alert, patient cooperative and oriented Pain management: pain level controlled Vital Signs Assessment: post-procedure vital signs reviewed and stable Respiratory status: nonlabored ventilation, spontaneous breathing and respiratory function stable Cardiovascular status: blood pressure returned to baseline and stable Postop Assessment: no apparent nausea or vomiting and able to ambulate Anesthetic complications: no   No notable events documented.  Last Vitals:  Vitals:   08/15/21 1340 08/15/21 1350  BP: (!) 119/57 (!) 125/59  Pulse: 80 83  Resp: 15 14  Temp:    SpO2: 97% 98%    Last Pain:  Vitals:   08/15/21 1350  TempSrc:   PainSc: 0-No pain                 Macguire Holsinger,E. Carmella Kees

## 2021-08-15 NOTE — Discharge Instructions (Signed)

## 2021-08-15 NOTE — Interval H&P Note (Signed)
History and Physical Interval Note:  08/15/2021 11:54 AM  Joanna Baker  has presented today for surgery, with the diagnosis of ASD.  The various methods of treatment have been discussed with the patient and family. After consideration of risks, benefits and other options for treatment, the patient has consented to  Procedure(s): TRANSESOPHAGEAL ECHOCARDIOGRAM (TEE) (N/A) as a surgical intervention.  The patient's history has been reviewed, patient examined, no change in status, stable for surgery.  I have reviewed the patient's chart and labs.  Questions were answered to the patient's satisfaction.     Wright Gravely

## 2021-08-15 NOTE — Transfer of Care (Signed)
Immediate Anesthesia Transfer of Care Note  Patient: Joanna Baker  Procedure(s) Performed: TRANSESOPHAGEAL ECHOCARDIOGRAM (TEE)  Patient Location: PACU  Anesthesia Type:MAC  Level of Consciousness: awake, alert  and oriented  Airway & Oxygen Therapy: Patient Spontanous Breathing  Post-op Assessment: Report given to RN and Post -op Vital signs reviewed and stable  Post vital signs: Reviewed and stable  Last Vitals:  Vitals Value Taken Time  BP    Temp    Pulse    Resp    SpO2      Last Pain:  Vitals:   08/15/21 1220  TempSrc: Oral  PainSc: 0-No pain         Complications: No notable events documented.

## 2021-08-15 NOTE — Op Note (Signed)
INDICATIONS: ASD  PROCEDURE:   Informed consent was obtained prior to the procedure. The risks, benefits and alternatives for the procedure were discussed and the patient comprehended these risks.  Risks include, but are not limited to, cough, sore throat, vomiting, nausea, somnolence, esophageal and stomach trauma or perforation, bleeding, low blood pressure, aspiration, pneumonia, infection, trauma to the teeth and death.    After a procedural time-out, the oropharynx was anesthetized with 20% benzocaine spray.   During this procedure the patient was administered IV propofol (Anesthesiology, Dr. Glennon Mac).  The transesophageal probe was inserted in the esophagus and stomach without difficulty and multiple views were obtained.  The patient was kept under observation until the patient left the procedure room.  The patient left the procedure room in stable condition.   Agitated microbubble saline contrast was not administered.  COMPLICATIONS:    There were no immediate complications.  FINDINGS:  Atrial septal aneurysm with a small secundum ASD in the anterosuperior part of the fossa ovalis. Otherwise normal study.  RECOMMENDATIONS:    Small overall ASD (0.2 cm sq by 3D guided planimetry and Qp:Qs 1.08:1 by MRI.  Time Spent Directly with the Patient:  30 minutes   Sigmond Patalano 08/15/2021, 1:26 PM

## 2021-08-15 NOTE — Anesthesia Preprocedure Evaluation (Addendum)
Anesthesia Evaluation  Patient identified by MRN, date of birth, ID band Patient awake    Reviewed: Allergy & Precautions, NPO status , Patient's Chart, lab work & pertinent test results  History of Anesthesia Complications Negative for: history of anesthetic complications  Airway Mallampati: II  TM Distance: >3 FB Neck ROM: Full    Dental  (+) Dental Advisory Given   Pulmonary asthma , COPD,  COPD inhaler, former smoker,    breath sounds clear to auscultation       Cardiovascular + Valvular Problems/Murmurs (possible ASD)  Rhythm:Regular Rate:Normal  '18 ECHO: EF 60-65%. Wall motion was normal; no regional wall motion abnormalities. Left ventricular diastolic function parameters were normal, mild TR\  02/2021 Stress: Overall normal stress test but because target heart rate was not met test is not diagnostic   Neuro/Psych negative neurological ROS     GI/Hepatic negative GI ROS, Neg liver ROS,   Endo/Other  obese  Renal/GU negative Renal ROS     Musculoskeletal   Abdominal (+) + obese,   Peds  Hematology   Anesthesia Other Findings   Reproductive/Obstetrics                           Anesthesia Physical Anesthesia Plan  ASA: 2  Anesthesia Plan: MAC   Post-op Pain Management: Minimal or no pain anticipated   Induction:   PONV Risk Score and Plan: 2 and Ondansetron and Treatment may vary due to age or medical condition  Airway Management Planned: Natural Airway and Nasal Cannula  Additional Equipment: None  Intra-op Plan:   Post-operative Plan:   Informed Consent: I have reviewed the patients History and Physical, chart, labs and discussed the procedure including the risks, benefits and alternatives for the proposed anesthesia with the patient or authorized representative who has indicated his/her understanding and acceptance.     Dental advisory given  Plan Discussed with:  CRNA and Surgeon  Anesthesia Plan Comments:         Anesthesia Quick Evaluation

## 2021-08-16 DIAGNOSIS — M4723 Other spondylosis with radiculopathy, cervicothoracic region: Secondary | ICD-10-CM | POA: Diagnosis not present

## 2021-08-16 DIAGNOSIS — M5441 Lumbago with sciatica, right side: Secondary | ICD-10-CM | POA: Diagnosis not present

## 2021-08-16 DIAGNOSIS — M4724 Other spondylosis with radiculopathy, thoracic region: Secondary | ICD-10-CM | POA: Diagnosis not present

## 2021-08-16 DIAGNOSIS — M4728 Other spondylosis with radiculopathy, sacral and sacrococcygeal region: Secondary | ICD-10-CM | POA: Diagnosis not present

## 2021-08-18 ENCOUNTER — Other Ambulatory Visit: Payer: Self-pay

## 2021-08-18 ENCOUNTER — Ambulatory Visit (INDEPENDENT_AMBULATORY_CARE_PROVIDER_SITE_OTHER): Payer: BC Managed Care – PPO | Admitting: Cardiovascular Disease

## 2021-08-18 ENCOUNTER — Encounter: Payer: Self-pay | Admitting: Cardiovascular Disease

## 2021-08-18 VITALS — BP 104/72 | HR 84 | Ht 67.0 in | Wt 188.2 lb

## 2021-08-18 DIAGNOSIS — Q211 Atrial septal defect, unspecified: Secondary | ICD-10-CM

## 2021-08-18 NOTE — Patient Instructions (Signed)
Medication Instructions:  Your physician recommends that you continue on your current medications as directed. Please refer to the Current Medication list given to you today.  *If you need a refill on your cardiac medications before your next appointment, please call your pharmacy*   Lab Work: NONE If you have labs (blood work) drawn today and your tests are completely normal, you will receive your results only by: [place] (if you have MyChart) OR A paper copy in the mail If you have any lab test that is abnormal or we need to change your treatment, we will call you to review the results.   Testing/Procedures: NONE   Follow-Up: At [facility], you and your health needs are our priority.  As part of our continuing mission to provide you with exceptional heart care, we have created designated Provider Care Teams.  These Care Teams include your primary Cardiologist (physician) and Advanced Practice Providers (APPs -  Physician Assistants and Nurse Practitioners) who all work together to provide you with the care you need, when you need it.  Your next appointment:   As needed  Provider:   Sherren Mocha   Thank you for allowing myself and [place] to serve you, God Bless!!

## 2021-08-18 NOTE — Progress Notes (Signed)
Cardiology Office Note:    Date:  08/18/2021   ID:  Joanna Baker, DOB [birth year], MRN [id number]  PCP:  Lavada Mesi   [facility] Providers Cardiologist:  None     Referring MD: Lelon Perla, MD   Chief Complaint  Patient presents with   Atrial Septal Defect    History of Present Illness:    Joanna Baker is a 51 y.o. female referred by Dr. Stanford Breed for evaluation of atrial septal defect.  The patient is 51 years old and has developed exertional dyspnea.  She describes exercise intolerance with any moderate level physical activity.  An exercise treadmill stress test was performed, demonstrating only fair exercise capacity and patient unable to achieve an adequate heart rate.  Therefore, a coronary CTA was performed.  This showed no evidence of CAD, but incidentally found a secundum ASD.  The defect measured 7 x 3.7 mm.  Further imaging was recommended and she underwent a transesophageal echo demonstrating atrial septal aneurysm with a small secundum ASD.  She also underwent a cardiac MRI demonstrating normal RV size and systolic function and a QP: QS of 1.08: 1.  The patient presents today for discussion of treatment options related to her atrial septal defect.  She has had no recent chest pain, chest pressure, leg swelling, orthopnea, or PND.  She has a history of heart palpitations related to a stressful work environment, but this is improved significantly.  Outpatient monitoring has previously been done and no significant arrhythmia was identified.  Past Medical History:  Diagnosis Date   ACL injury tear    Right knee, not repaired.     Anxiety    Asthma    Depression    Fatty liver    IBS (irritable bowel syndrome)    TB (tuberculosis), treated     6 month of INH at age 51    Past Surgical History:  Procedure Laterality Date   COLONOSCOPY     excision cells breast Left    TEE WITHOUT CARDIOVERSION N/A 08/15/2021   Procedure: TRANSESOPHAGEAL  ECHOCARDIOGRAM (TEE);  Surgeon: Sanda Klein, MD;  Location: [facility];  Service: Cardiovascular;  Laterality: N/A;   TONSILLECTOMY  07/17/1991    Current Medications: Current Meds  Medication Sig   albuterol (VENTOLIN HFA) 108 (90 Base) MCG/ACT inhaler Inhale 2 puffs into the lungs every 6 (six) hours as needed for wheezing or shortness of breath.   calcium-vitamin D (OSCAL WITH D) 500-200 MG-UNIT TABS tablet Take 1 tablet by mouth 2 (two) times daily.   cyanocobalamin (,VITAMIN B-12,) 1000 MCG/ML injection ADMINISTER 1 ML(1000 MCG) IN THE MUSCLE EVERY 30 DAYS   cyclobenzaprine (FLEXERIL) 10 MG tablet 1/2-1 tab p.o. nightly   EPINEPHrine 0.3 mg/0.3 mL IJ SOAJ injection Inject 0.3 mg into the muscle as needed for anaphylaxis.   ibuprofen (ADVIL) 800 MG tablet Take 800 mg by mouth every 8 (eight) hours as needed (pain.).   ipratropium (ATROVENT) 0.06 % nasal spray Place 2 sprays into both nostrils 4 (four) times daily.   LORazepam (ATIVAN) 0.5 MG tablet Take 1 tablet (0.5 mg total) by mouth every 8 (eight) hours as needed. for anxiety   naproxen sodium (ANAPROX) 220 MG tablet Take 220 mg by mouth daily as needed (Pain).   pseudoephedrine (SUDAFED) 30 MG tablet Take 30 mg by mouth daily as needed for congestion.     Allergies:   Influenza vaccines, Iodine, Chantix [varenicline tartrate], Lobster [shellfish allergy], Penicillins, Prednisone, Prozac [fluoxetine hcl], and  Sulfa antibiotics   Social History   Socioeconomic History   Marital status: Single    Spouse name: Not on file   Number of children: Not on file   Years of education: Not on file   Highest education level: Not on file  Occupational History   Not on file  Tobacco Use   Smoking status: Former    Packs/day: 0.25    Years: 5.00    Pack years: 1.25    Types: Cigarettes   Smokeless tobacco: Never  Vaping Use   Vaping Use: Never used  Substance and Sexual Activity   Alcohol use: Yes    Alcohol/week: 2.0 standard  drinks    Types: 2 Cans of beer per week    Comment: Occasional   Drug use: Yes    Types: Marijuana    Comment: history of use, not current use   Sexual activity: Yes    Partners: Female  Other Topics Concern   Not on file  Social History Narrative   Not on file   Social Determinants of Health   Financial Resource Strain: Not on file  Food Insecurity: Not on file  Transportation Needs: Not on file  Physical Activity: Not on file  Stress: Not on file  Social Connections: Not on file     Family History: The patient's family history includes Alcohol abuse in her father; Alcoholism in her father; Depression in her paternal grandfather; Diabetes in her father; Heart attack in her maternal grandmother; Heart failure in her father; Pulmonary embolism in her mother; Stroke (age of onset: 62) in her mother; Suicidality in her father. There is no history of Breast cancer.  ROS:   Please see the history of present illness.    All other systems reviewed and are negative.  EKGs/Labs/Other Studies Reviewed:    The following studies were reviewed today: TEE:  1. Left ventricular ejection fraction, by estimation, is 60 to 65%. The  left ventricle has normal function. The left ventricle has no regional  wall motion abnormalities.   2. Right ventricular systolic function is normal. The right ventricular  size is normal.   3. No left atrial/left atrial appendage thrombus was detected.   4. The mitral valve is normal in structure. No evidence of mitral valve  regurgitation. No evidence of mitral stenosis.   5. The aortic valve is normal in structure. Aortic valve regurgitation is  not visualized. No aortic stenosis is present.   6. The inferior vena cava is normal in size with greater than 50%  respiratory variability, suggesting right atrial pressure of 3 mmHg.   7. Evidence of atrial level shunting detected by color flow Doppler.  There is a small secundum atrial septal defect with  predominantly left to  right shunting across the atrial septum.   Conclusion(s)/Recommendation(s): Large atrial septal aneurysm with small  secundum ASD (0.2 cm sq) and small left to right shunt (shunt fraction  1.07:1).   Cardiac CT: FINDINGS: Image quality: Good   Noise artifact is: Limited   Coronary calcium score is 0.   Coronary arteries: Normal coronary origins.  Right dominance.   Right Coronary Artery: No detectable plaque or stenosis.   Left Main Coronary Artery: No detectable plaque or stenosis.   Left Anterior Descending Coronary Artery: No detectable plaque or stenosis.   Left Circumflex Artery: No detectable plaque or stenosis.   Aorta: Normal size, 31 mm at the mid ascending aorta (level of the PA bifurcation) measured double oblique. No  calcifications. No dissection.   Aortic Valve: No calcifications.  Tricuspid aortic valve.   Other findings:   Secundum atrial septal defect, measuring approximately 7 mm by 3.7 mm, area 0.196 cm2.   Normal pulmonary vein drainage into the left atrium.   Normal left atrial appendage without thrombus. Small lateral accessory appendage without thrombus.   Normal size of the main pulmonary artery. Mild dilation of Left pulmonary artery, 27 mm.   IMPRESSION: 1. No evidence of CAD, CADRADS = 0.   2. Coronary calcium score of 0.   3. Normal coronary origins with right dominance.   4. Secundum atrial septal defect, measuring approximately 7 mm by 3.7 mm, area 0.196 cm2. Normal pulmonary vein drainage into the left atrium.   5. Moderate right ventricular enlargement, mild right atrial enlargement.   6. Normal size of the main pulmonary artery. Mild dilation of Left pulmonary artery, 27 mm.   RECOMMENDATIONS: RECOMMENDATIONS Consider cardiac MRI to characterize Qp/Qs and RV ventricular size. Can also consider TEE and structural heart team consult.   Cardiac MRI: IMPRESSION: 1.  Normal LV size with mild diffuse  hypokinesis, EF 51%.   2.  Normal RV size with EF 46% (normal).   3.  Borderline right atrial dilation.   4. A definite ASD was not visualized but spatial resolution by MRI is not as good as TEE for this. Qp/Qs was 1.08, suggesting that if an ASD is present, it is likely relatively small.   5.  No myocardial LGE.  ETT 02/15/21: Fair exercise capacity, achieved 6.9 METS Normal BP response to exercise Did not meet target heart rate. Peak HR 130 bpm (75% max predicted heart rate) No ischemic EKG changes seen but study is not diagnostic due to failure to meet target heart rate  Holter 01/08/2017: Minimum HR: 58 BPM at 5:15:27 AM Maximum HR: 147 BPM at 11:50:32 AM Average HR: 88 BPM <1% PACs or PVCs Sinus rhythm with sinus tachycardia Shortness of breath and palpitations associated with sinus rhythm  EKG:  EKG is ordered today.  The ekg ordered today demonstrates NSR 84 bpm, within normal limits  Recent Labs: 01/23/2021: ALT 19; Platelets 250; TSH 1.84 06/07/2021: BUN 11; Creat 0.60; Potassium 3.8; Sodium 141 06/19/2021: Hemoglobin 13.2  Recent Lipid Panel    Component Value Date/Time   CHOL 176 01/23/2021 0000   TRIG 60 01/23/2021 0000   HDL 61 01/23/2021 0000   CHOLHDL 2.9 01/23/2021 0000   VLDL 8 05/21/2016 0828   LDLCALC 100 (H) 01/23/2021 0000     Risk Assessment/Calculations:           Physical Exam:    VS:  BP 104/72    Pulse 84    Ht 5\' 7"  (1.702 m)    Wt 188 lb 3.2 oz (85.4 kg)    SpO2 99%    BMI 29.48 kg/m     Wt Readings from Last 3 Encounters:  08/18/21 188 lb 3.2 oz (85.4 kg)  07/24/21 188 lb 1.9 oz (85.3 kg)  07/14/21 189 lb (85.7 kg)     GEN:  Well nourished, well developed in no acute distress HEENT: Normal NECK: No JVD; No carotid bruits LYMPHATICS: No lymphadenopathy CARDIAC: RRR, no murmurs, rubs, gallops RESPIRATORY:  Clear to auscultation without rales, wheezing or rhonchi  ABDOMEN: Soft, non-tender, non-distended MUSCULOSKELETAL:  No  edema; No deformity  SKIN: Warm and dry NEUROLOGIC:  Alert and oriented x 3 PSYCHIATRIC:  Normal affect   ASSESSMENT:    1.  Atrial septal defect    PLAN:    In order of problems listed above:  The patient has a small atrial septal defect, ostium secundum type.  While her anatomy is suitable for transcatheter closure, I do not think there is any indication to pursue closure of her ASD.  RV size and function are normal.  The right atrium has borderline enlargement.  I do not think her symptoms have any relationship to the presence of an ASD.  The shunt fraction is only 1.08: 1.  This is evaluated by both TEE and cardiac MRI.  I reviewed the patient's TEE images with her and her mother today.  I would recommend continued observation and it would be reasonable to do a surveillance echocardiogram about every 2 to 3 years.  I think it is unlikely that she will develop significant change in her RV size or function based on the small diameter and lack of hemodynamically significant left to right shunt with this atrial septal defect.  We discussed a graded exercise program to improve her exercise intolerance and shortness of breath.  I reviewed all of her imaging studies today.  Her CT did not demonstrate any evidence of parenchymal lung disease.  From a cardiac perspective, LV and RV function are normal.  There is no coronary artery disease.  The patient will continue to follow with Dr. Stanford Breed.  I would be happy to see her back as needed in the future.           Medication Adjustments/Labs and Tests Ordered: Current medicines are reviewed at length with the patient today.  Concerns regarding medicines are outlined above.  Orders Placed This Encounter  Procedures   EKG 12-Lead   No orders of the defined types were placed in this encounter.   Patient Instructions  Medication Instructions:  Your physician recommends that you continue on your current medications as directed. Please refer to the  Current Medication list given to you today.  *If you need a refill on your cardiac medications before your next appointment, please call your pharmacy*   Lab Work: NONE If you have labs (blood work) drawn today and your tests are completely normal, you will receive your results only by: [place] (if you have MyChart) OR A paper copy in the mail If you have any lab test that is abnormal or we need to change your treatment, we will call you to review the results.   Testing/Procedures: NONE   Follow-Up: At [facility], you and your health needs are our priority.  As part of our continuing mission to provide you with exceptional heart care, we have created designated Provider Care Teams.  These Care Teams include your primary Cardiologist (physician) and Advanced Practice Providers (APPs -  Physician Assistants and Nurse Practitioners) who all work together to provide you with the care you need, when you need it.  Your next appointment:   As needed  Provider:   Sherren Mocha   Thank you for allowing myself and [place] to serve you, God Bless!!    Signed, Sherren Mocha, MD  08/18/2021 12:43 PM    [place]

## 2021-08-23 DIAGNOSIS — M4723 Other spondylosis with radiculopathy, cervicothoracic region: Secondary | ICD-10-CM | POA: Diagnosis not present

## 2021-08-23 DIAGNOSIS — M4728 Other spondylosis with radiculopathy, sacral and sacrococcygeal region: Secondary | ICD-10-CM | POA: Diagnosis not present

## 2021-08-23 DIAGNOSIS — M5441 Lumbago with sciatica, right side: Secondary | ICD-10-CM | POA: Diagnosis not present

## 2021-08-23 DIAGNOSIS — M4724 Other spondylosis with radiculopathy, thoracic region: Secondary | ICD-10-CM | POA: Diagnosis not present

## 2021-10-10 NOTE — Progress Notes (Deleted)
? ? ? ? ?HPI:FU ASD.  Monitor June 2018 showed sinus rhythm with occasional PAC and PVC.  Exercise treadmill August 2022 showed no ST changes but she failed to reach target heart rate (75%).  Cardiac CTA November 2022 showed calcium score 0 with no evidence of coronary disease.  There was note of a secundum atrial septal defect with normal pulmonary vein drainage into the left atrium.  There was moderate right ventricular enlargement and mild right atrial enlargement. Cardiac MRI December 2022 showed normal LV function, borderline right atrial dilatation, Qp/Qs 1.08.  TEE January 2023 showed normal LV function, small secundum ASD with left-to-right shunting (shunt fraction 1.07: 1).  Patient was seen by Dr. Burt Knack and it was felt the ASD was small and did not require closure at this point.  He recommended follow-up echocardiograms.  Since last seen  ? ?Current Outpatient Medications  ?Medication Sig Dispense Refill  ? albuterol (VENTOLIN HFA) 108 (90 Base) MCG/ACT inhaler Inhale 2 puffs into the lungs every 6 (six) hours as needed for wheezing or shortness of breath. 18 g 1  ? calcium-vitamin D (OSCAL WITH D) 500-200 MG-UNIT TABS tablet Take 1 tablet by mouth 2 (two) times daily.    ? cyanocobalamin (,VITAMIN B-12,) 1000 MCG/ML injection ADMINISTER 1 ML(1000 MCG) IN THE MUSCLE EVERY 30 DAYS 3 mL 3  ? cyclobenzaprine (FLEXERIL) 10 MG tablet 1/2-1 tab p.o. nightly 30 tablet 2  ? EPINEPHrine 0.3 mg/0.3 mL IJ SOAJ injection Inject 0.3 mg into the muscle as needed for anaphylaxis.    ? ibuprofen (ADVIL) 800 MG tablet Take 800 mg by mouth every 8 (eight) hours as needed (pain.).    ? ipratropium (ATROVENT) 0.06 % nasal spray Place 2 sprays into both nostrils 4 (four) times daily. 15 mL 0  ? LORazepam (ATIVAN) 0.5 MG tablet Take 1 tablet (0.5 mg total) by mouth every 8 (eight) hours as needed. for anxiety 30 tablet 1  ? montelukast (SINGULAIR) 10 MG tablet Take 1 tablet (10 mg total) by mouth at bedtime. (Patient not  taking: Reported on 07/24/2021) 90 tablet 3  ? naproxen sodium (ANAPROX) 220 MG tablet Take 220 mg by mouth daily as needed (Pain).    ? pseudoephedrine (SUDAFED) 30 MG tablet Take 30 mg by mouth daily as needed for congestion.    ? ?No current facility-administered medications for this visit.  ? ? ? ?Past Medical History:  ?Diagnosis Date  ? ACL injury tear   ? Right knee, not repaired.    ? Anxiety   ? Asthma   ? Depression   ? Fatty liver   ? IBS (irritable bowel syndrome)   ? TB (tuberculosis), treated    ? 6 month of INH at age 38  ? ? ?Past Surgical History:  ?Procedure Laterality Date  ? COLONOSCOPY    ? excision cells breast Left   ? TEE WITHOUT CARDIOVERSION N/A 08/15/2021  ? Procedure: TRANSESOPHAGEAL ECHOCARDIOGRAM (TEE);  Surgeon: Sanda Klein, MD;  Location: [facility];  Service: Cardiovascular;  Laterality: N/A;  ? TONSILLECTOMY  07/17/1991  ? ? ?Social History  ? ?Socioeconomic History  ? Marital status: Single  ?  Spouse name: Not on file  ? Number of children: Not on file  ? Years of education: Not on file  ? Highest education level: Not on file  ?Occupational History  ? Not on file  ?Tobacco Use  ? Smoking status: Former  ?  Packs/day: 0.25  ?  Years: 5.00  ?  Pack years: 1.25  ?  Types: Cigarettes  ? Smokeless tobacco: Never  ?Vaping Use  ? Vaping Use: Never used  ?Substance and Sexual Activity  ? Alcohol use: Yes  ?  Alcohol/week: 2.0 standard drinks  ?  Types: 2 Cans of beer per week  ?  Comment: Occasional  ? Drug use: Yes  ?  Types: Marijuana  ?  Comment: history of use, not current use  ? Sexual activity: Yes  ?  Partners: Female  ?Other Topics Concern  ? Not on file  ?Social History Narrative  ? Not on file  ? ?Social Determinants of Health  ? ?Financial Resource Strain: Not on file  ?Food Insecurity: Not on file  ?Transportation Needs: Not on file  ?Physical Activity: Not on file  ?Stress: Not on file  ?Social Connections: Not on file  ?Intimate Partner Violence: Not on file  ? ? ?Family  History  ?Problem Relation Age of Onset  ? Pulmonary embolism Mother   ? Stroke Mother 46  ? Alcohol abuse Father   ? Suicidality Father   ? Heart failure Father   ? Alcoholism Father   ? Diabetes Father   ? Heart attack Maternal Grandmother   ? Depression Paternal Grandfather   ? Breast cancer Neg Hx   ? ? ?ROS: no fevers or chills, productive cough, hemoptysis, dysphasia, odynophagia, melena, hematochezia, dysuria, hematuria, rash, seizure activity, orthopnea, PND, pedal edema, claudication. Remaining systems are negative. ? ?Physical Exam: ?Well-developed well-nourished in no acute distress.  ?Skin is warm and dry.  ?HEENT is normal.  ?Neck is supple.  ?Chest is clear to auscultation with normal expansion.  ?Cardiovascular exam is regular rate and rhythm.  ?Abdominal exam nontender or distended. No masses palpated. ?Extremities show no edema. ?neuro grossly intact ? ?ECG- personally reviewed ? ?A/P ? ?1 atrial septal defect-cardiac MRI and TEE showed small Qp/Qs and closure felt not indicated by Dr. Burt Knack.  We will plan to repeat echocardiogram in 2 years. ? ?2 history of atypical chest pain-previous cardiac CTA showed no coronary disease. ? ?Kirk Ruths, MD ? ? ? ?

## 2021-10-18 ENCOUNTER — Ambulatory Visit: Payer: BC Managed Care – PPO | Admitting: Cardiology

## 2021-11-02 DIAGNOSIS — M5441 Lumbago with sciatica, right side: Secondary | ICD-10-CM | POA: Diagnosis not present

## 2021-11-02 DIAGNOSIS — M4728 Other spondylosis with radiculopathy, sacral and sacrococcygeal region: Secondary | ICD-10-CM | POA: Diagnosis not present

## 2021-11-02 DIAGNOSIS — M4724 Other spondylosis with radiculopathy, thoracic region: Secondary | ICD-10-CM | POA: Diagnosis not present

## 2021-11-02 DIAGNOSIS — M4723 Other spondylosis with radiculopathy, cervicothoracic region: Secondary | ICD-10-CM | POA: Diagnosis not present

## 2021-11-08 DIAGNOSIS — L981 Factitial dermatitis: Secondary | ICD-10-CM | POA: Diagnosis not present

## 2021-11-08 DIAGNOSIS — M4723 Other spondylosis with radiculopathy, cervicothoracic region: Secondary | ICD-10-CM | POA: Diagnosis not present

## 2021-11-08 DIAGNOSIS — D692 Other nonthrombocytopenic purpura: Secondary | ICD-10-CM | POA: Diagnosis not present

## 2021-11-08 DIAGNOSIS — D225 Melanocytic nevi of trunk: Secondary | ICD-10-CM | POA: Diagnosis not present

## 2021-11-08 DIAGNOSIS — M4728 Other spondylosis with radiculopathy, sacral and sacrococcygeal region: Secondary | ICD-10-CM | POA: Diagnosis not present

## 2021-11-08 DIAGNOSIS — M5441 Lumbago with sciatica, right side: Secondary | ICD-10-CM | POA: Diagnosis not present

## 2021-11-08 DIAGNOSIS — D235 Other benign neoplasm of skin of trunk: Secondary | ICD-10-CM | POA: Diagnosis not present

## 2021-11-08 DIAGNOSIS — M4724 Other spondylosis with radiculopathy, thoracic region: Secondary | ICD-10-CM | POA: Diagnosis not present

## 2021-11-20 NOTE — Progress Notes (Signed)
HPI:FU ASD.  Monitor June 2018 showed sinus rhythm with occasional PAC and PVC.  Echocardiogram June 2018 showed normal LV function.  Exercise treadmill August 2022 showed no ST changes but she failed to reach target heart rate (75%).  Cardiac CTA November 2022 showed calcium score 0 with no evidence of coronary disease.  There was note of a secundum atrial septal defect with normal pulmonary vein drainage into the left atrium.  There was moderate right ventricular enlargement and mild right atrial enlargement.  Cardiac MRI December 2022 showed normal LV size with ejection fraction 51%, normal RV size with normal function, borderline right atrial dilatation and ASD with QP/QS 1.08.  TEE January 2023 showed normal LV function, small secundum ASD with left-to-right shunting (shunt fraction 1.07:1).  Patient seen by Dr. Burt Knack and medical therapy with follow-up echocardiograms recommended (ASD is small with low QP/QS.  Snce last seen she denies dyspnea on exertion, orthopnea, PND, pedal edema, syncope or chest pain.  Occasional brief dizzy episodes.  Current Outpatient Medications  Medication Sig Dispense Refill   calcium-vitamin D (OSCAL WITH D) 500-200 MG-UNIT TABS tablet Take 1 tablet by mouth 2 (two) times daily.     cyanocobalamin (,VITAMIN B-12,) 1000 MCG/ML injection ADMINISTER 1 ML(1000 MCG) IN THE MUSCLE EVERY 30 DAYS 3 mL 3   cyclobenzaprine (FLEXERIL) 10 MG tablet 1/2-1 tab p.o. nightly 30 tablet 2   EPINEPHrine 0.3 mg/0.3 mL IJ SOAJ injection Inject 0.3 mg into the muscle as needed for anaphylaxis.     ibuprofen (ADVIL) 800 MG tablet Take 800 mg by mouth every 8 (eight) hours as needed (pain.).     ipratropium (ATROVENT) 0.06 % nasal spray Place 2 sprays into both nostrils 4 (four) times daily. 15 mL 0   LORazepam (ATIVAN) 0.5 MG tablet Take 1 tablet (0.5 mg total) by mouth every 8 (eight) hours as needed. for anxiety 30 tablet 1   naproxen sodium (ANAPROX) 220 MG tablet Take 220 mg by  mouth daily as needed (Pain).     pseudoephedrine (SUDAFED) 30 MG tablet Take 30 mg by mouth daily as needed for congestion.     montelukast (SINGULAIR) 10 MG tablet Take 1 tablet (10 mg total) by mouth at bedtime. (Patient not taking: Reported on 07/24/2021) 90 tablet 3   No current facility-administered medications for this visit.     Past Medical History:  Diagnosis Date   ACL injury tear    Right knee, not repaired.     Anxiety    Asthma    Depression    Fatty liver    IBS (irritable bowel syndrome)    TB (tuberculosis), treated     6 month of INH at age 24    Past Surgical History:  Procedure Laterality Date   COLONOSCOPY     excision cells breast Left    TEE WITHOUT CARDIOVERSION N/A 08/15/2021   Procedure: TRANSESOPHAGEAL ECHOCARDIOGRAM (TEE);  Surgeon: Sanda Klein, MD;  Location: [facility] ENDOSCOPY;  Service: Cardiovascular;  Laterality: N/A;   TONSILLECTOMY  07/17/1991    Social History   Socioeconomic History   Marital status: Single    Spouse name: Not on file   Number of children: Not on file   Years of education: Not on file   Highest education level: Not on file  Occupational History   Not on file  Tobacco Use   Smoking status: Former    Packs/day: 0.25    Years: 5.00    Pack  years: 1.25    Types: Cigarettes   Smokeless tobacco: Never  Vaping Use   Vaping Use: Never used  Substance and Sexual Activity   Alcohol use: Yes    Alcohol/week: 2.0 standard drinks    Types: 2 Cans of beer per week    Comment: Occasional   Drug use: Yes    Types: Marijuana    Comment: history of use, not current use   Sexual activity: Yes    Partners: Female  Other Topics Concern   Not on file  Social History Narrative   Not on file   Social Determinants of Health   Financial Resource Strain: Not on file  Food Insecurity: Not on file  Transportation Needs: Not on file  Physical Activity: Not on file  Stress: Not on file  Social Connections: Not on file  Intimate  Partner Violence: Not on file    Family History  Problem Relation Age of Onset   Pulmonary embolism Mother    Stroke Mother 35   Alcohol abuse Father    Suicidality Father    Heart failure Father    Alcoholism Father    Diabetes Father    Heart attack Maternal Grandmother    Depression Paternal Grandfather    Breast cancer Neg Hx     ROS: no fevers or chills, productive cough, hemoptysis, dysphasia, odynophagia, melena, hematochezia, dysuria, hematuria, rash, seizure activity, orthopnea, PND, pedal edema, claudication. Remaining systems are negative.  Physical Exam: Well-developed well-nourished in no acute distress.  Skin is warm and dry.  HEENT is normal.  Neck is supple.  Chest is clear to auscultation with normal expansion.  Cardiovascular exam is regular rate and rhythm.  Abdominal exam nontender or distended. No masses palpated. Extremities show no edema. neuro grossly intact  A/P  1 atrial septal defect-Small on cardiac MRI and transesophageal echocardiogram.  Seen by Dr. Burt Knack and medical therapy felt indicated.  We will plan to repeat echocardiogram 3 years from most recent study.  2 history of chest pain-no coronary disease noted on cardiac CTA.  3 anxiety-Per primary care.  Kirk Ruths, MD

## 2021-11-28 DIAGNOSIS — R590 Localized enlarged lymph nodes: Secondary | ICD-10-CM | POA: Diagnosis not present

## 2021-11-28 DIAGNOSIS — R928 Other abnormal and inconclusive findings on diagnostic imaging of breast: Secondary | ICD-10-CM | POA: Diagnosis not present

## 2021-12-04 ENCOUNTER — Encounter: Payer: Self-pay | Admitting: Cardiology

## 2021-12-04 ENCOUNTER — Ambulatory Visit (INDEPENDENT_AMBULATORY_CARE_PROVIDER_SITE_OTHER): Payer: BC Managed Care – PPO | Admitting: Cardiology

## 2021-12-04 VITALS — BP 90/64 | HR 86 | Ht 67.0 in | Wt 191.1 lb

## 2021-12-04 DIAGNOSIS — Q211 Atrial septal defect, unspecified: Secondary | ICD-10-CM | POA: Diagnosis not present

## 2021-12-04 DIAGNOSIS — R072 Precordial pain: Secondary | ICD-10-CM | POA: Diagnosis not present

## 2021-12-04 NOTE — Patient Instructions (Signed)

## 2021-12-07 DIAGNOSIS — Z6829 Body mass index (BMI) 29.0-29.9, adult: Secondary | ICD-10-CM | POA: Diagnosis not present

## 2021-12-07 DIAGNOSIS — Z01419 Encounter for gynecological examination (general) (routine) without abnormal findings: Secondary | ICD-10-CM | POA: Diagnosis not present

## 2021-12-12 ENCOUNTER — Encounter: Payer: Self-pay | Admitting: Physician Assistant

## 2021-12-14 DIAGNOSIS — Z20822 Contact with and (suspected) exposure to covid-19: Secondary | ICD-10-CM | POA: Diagnosis not present

## 2021-12-15 ENCOUNTER — Ambulatory Visit: Payer: BC Managed Care – PPO | Admitting: Physician Assistant

## 2021-12-20 ENCOUNTER — Telehealth: Payer: Self-pay

## 2021-12-20 ENCOUNTER — Ambulatory Visit (INDEPENDENT_AMBULATORY_CARE_PROVIDER_SITE_OTHER): Payer: BC Managed Care – PPO | Admitting: Physician Assistant

## 2021-12-20 ENCOUNTER — Encounter: Payer: Self-pay | Admitting: Physician Assistant

## 2021-12-20 ENCOUNTER — Other Ambulatory Visit: Payer: Self-pay | Admitting: Physician Assistant

## 2021-12-20 VITALS — BP 119/71 | HR 94 | Ht 67.0 in | Wt 192.0 lb

## 2021-12-20 DIAGNOSIS — E538 Deficiency of other specified B group vitamins: Secondary | ICD-10-CM

## 2021-12-20 DIAGNOSIS — J069 Acute upper respiratory infection, unspecified: Secondary | ICD-10-CM

## 2021-12-20 DIAGNOSIS — F411 Generalized anxiety disorder: Secondary | ICD-10-CM | POA: Diagnosis not present

## 2021-12-20 MED ORDER — IPRATROPIUM BROMIDE 0.06 % NA SOLN: 2.0000 | Freq: Four times a day (QID) | NASAL | 5 refills | Status: DC

## 2021-12-20 MED ORDER — SERTRALINE HCL 50 MG PO TABS: ORAL_TABLET | ORAL | 1 refills | Status: DC

## 2021-12-20 MED ORDER — CYANOCOBALAMIN 1000 MCG/ML IJ SOLN: INTRAMUSCULAR | 3 refills | Status: DC

## 2021-12-20 NOTE — Patient Instructions (Signed)

## 2021-12-20 NOTE — Progress Notes (Signed)
Established Patient Office Visit  Subjective   Patient ID: Joanna Baker, female    DOB: Apr 20, 1971  Age: 51 y.o. MRN: [id number]  Chief Complaint  Patient presents with   Anxiety    HPI Pt is a 51 yo female with known anxiety who presents to the clinic to discuss medications.  She has contemplated taking medications for years but never done so.  She is meeting with her GYN and had a breakdown and he suggested that we discussed medications.  She did have a regular counselor, Minette Brine.  She was doing really well with her but is not able to see her due to insurance reasons now.  She does think she would benefit from another counselor.  She has a lot of repetitive thoughts of guilt, anxiousness, worry.  For example she is worried right now she will have time to get breakfast before she has to be at work and then she will get written up. She recently has been struggling with feeling lonely.  She feels like she has no friends.  She thought she had about 6-7 really good friends but they do not seem to be there for her.  This has been hard for her to understand.  She denies any active plan or consideration of hurting herself or others.  Sometimes she does feel like she does not have a purpose.   ROS See HPI.    Objective:     BP 119/71   Pulse 94   Ht '5\' 7"'$  (1.702 m)   Wt 192 lb (87.1 kg)   SpO2 99%   BMI 30.07 kg/m  BP Readings from Last 3 Encounters:  12/20/21 119/71  12/04/21 90/64  08/18/21 104/72      Physical Exam Vitals reviewed.  Constitutional:      Appearance: Normal appearance.  HENT:     Head: Normocephalic.  Cardiovascular:     Rate and Rhythm: Normal rate and regular rhythm.  Pulmonary:     Effort: Pulmonary effort is normal.  Musculoskeletal:     Right lower leg: No edema.     Left lower leg: No edema.  Neurological:     General: No focal deficit present.     Mental Status: She is alert and oriented to person, place, and time.  Psychiatric:     Comments:  tearful   ..    12/20/2021    9:15 AM 06/16/2021   10:29 AM 01/23/2021   10:55 AM 11/16/2020    9:14 AM 03/16/2020    9:57 AM  Depression screen PHQ 2/9  Decreased Interest 1 0 0 0 0  Down, Depressed, Hopeless 1 0 0 0 0  PHQ - 2 Score 2 0 0 0 0  Altered sleeping 1 0     Tired, decreased energy 1 1     Change in appetite 1 0     Feeling bad or failure about yourself  3 1     Trouble concentrating 0 0     Moving slowly or fidgety/restless 0 1     Suicidal thoughts 1 0     PHQ-9 Score 9 3     Difficult doing work/chores Somewhat difficult Not difficult at all      ..    12/20/2021    9:18 AM 06/16/2021   10:29 AM 01/06/2020   10:38 AM 12/10/2018    9:59 AM  GAD 7 : Generalized Anxiety Score  Nervous, Anxious, on Edge '2 1 1 '$ 0  Control/stop worrying  1 0 0 0  Worry too much - different things 1 1 0 0  Trouble relaxing 3 1 0 0  Restless 2 1 0 0  Easily annoyed or irritable '1 1 1 2  '$ Afraid - awful might happen 1 0 0 0  Total GAD 7 Score '11 5 2 2  '$ Anxiety Difficulty Somewhat difficult Somewhat difficult Not difficult at all Somewhat difficult          Assessment & Plan:  Marland KitchenMarland KitchenBetsey was seen today for anxiety.  Diagnoses and all orders for this visit:  GAD (generalized anxiety disorder) -     sertraline (ZOLOFT) 50 MG tablet; Take 1/2 tablet for 7 days then increase to one tablet daily. -     Ambulatory referral to Psychology  Viral URI with cough -     ipratropium (ATROVENT) 0.06 % nasal spray; Place 2 sprays into both nostrils 4 (four) times daily.  Vitamin B12 deficiency -     cyanocobalamin (,VITAMIN B-12,) 1000 MCG/ML injection; ADMINISTER 1 ML(1000 MCG) IN THE MUSCLE EVERY 30 DAYS   PHQ and GAD numbers not to goal Needs to find a new counselor Referral placed Start zoloft 1/2 tablet for 7 days then increase to 1 full tablet Discussed side effects Exercise daily Follow up in 4-6 weeks.    Return for 4-6 weeks for F/U on depression/anxiety.    Iran Planas,  PA-C

## 2021-12-20 NOTE — Telephone Encounter (Addendum)
Initiated Prior authorization [id number] Bromide 0.06% solution Via: Covermymeds Case/Key: [id number] Status: approved  as of 12/20/21 Reason: Notified Pt via: Mychart

## 2021-12-27 DIAGNOSIS — M4724 Other spondylosis with radiculopathy, thoracic region: Secondary | ICD-10-CM | POA: Diagnosis not present

## 2021-12-27 DIAGNOSIS — M4723 Other spondylosis with radiculopathy, cervicothoracic region: Secondary | ICD-10-CM | POA: Diagnosis not present

## 2021-12-27 DIAGNOSIS — M5441 Lumbago with sciatica, right side: Secondary | ICD-10-CM | POA: Diagnosis not present

## 2021-12-27 DIAGNOSIS — M4728 Other spondylosis with radiculopathy, sacral and sacrococcygeal region: Secondary | ICD-10-CM | POA: Diagnosis not present

## 2022-01-02 DIAGNOSIS — M4724 Other spondylosis with radiculopathy, thoracic region: Secondary | ICD-10-CM | POA: Diagnosis not present

## 2022-01-02 DIAGNOSIS — M5441 Lumbago with sciatica, right side: Secondary | ICD-10-CM | POA: Diagnosis not present

## 2022-01-02 DIAGNOSIS — M4728 Other spondylosis with radiculopathy, sacral and sacrococcygeal region: Secondary | ICD-10-CM | POA: Diagnosis not present

## 2022-01-02 DIAGNOSIS — M4723 Other spondylosis with radiculopathy, cervicothoracic region: Secondary | ICD-10-CM | POA: Diagnosis not present

## 2022-01-11 ENCOUNTER — Ambulatory Visit (INDEPENDENT_AMBULATORY_CARE_PROVIDER_SITE_OTHER): Payer: BC Managed Care – PPO | Admitting: Sports Medicine

## 2022-01-11 ENCOUNTER — Other Ambulatory Visit: Payer: Self-pay | Admitting: Physician Assistant

## 2022-01-11 DIAGNOSIS — M5412 Radiculopathy, cervical region: Secondary | ICD-10-CM

## 2022-01-11 DIAGNOSIS — M7702 Medial epicondylitis, left elbow: Secondary | ICD-10-CM

## 2022-01-11 DIAGNOSIS — M5416 Radiculopathy, lumbar region: Secondary | ICD-10-CM

## 2022-01-11 DIAGNOSIS — F411 Generalized anxiety disorder: Secondary | ICD-10-CM

## 2022-01-11 NOTE — Progress Notes (Signed)
    Procedures performed today:    None.  Independent interpretation of notes and tests performed by another provider:   None.  Brief History, Exam, Impression, and Recommendations:    Right lumbar radiculitis Lumbar DDD, overall doing okay with chiropractic manipulation. She is having trouble fitting this into her schedule so I did offer manipulation with one of our DOs here. She will call to make an appointment if desired, home condition given, return as needed.  Radiculitis of right cervical region Continues to do well with complementary and alternative modalities such as chiropractic manipulation, acupuncture, ergonomic support. Oral NSAIDs. She did have an MRI that showed C5-C6 DDD likely compressing the right C6 nerve root. Continue home conditioning, return to see me as needed.  Medial epicondylitis, left Suspect mild medial epicondylitis, adding some conditioning, return as needed for this.    ____________________________________________ Gwen Her. Dianah Field, M.D., ABFM., CAQSM., AME. Primary Care and Sports Medicine [facility]  Adjunct Professor of [employer] of Medicine  Risk manager

## 2022-01-11 NOTE — Assessment & Plan Note (Signed)
Suspect mild medial epicondylitis, adding some conditioning, return as needed for this.

## 2022-01-11 NOTE — Assessment & Plan Note (Signed)
Continues to do well with complementary and alternative modalities such as chiropractic manipulation, acupuncture, ergonomic support. Oral NSAIDs. She did have an MRI that showed C5-C6 DDD likely compressing the right C6 nerve root. Continue home conditioning, return to see me as needed.

## 2022-01-11 NOTE — Assessment & Plan Note (Signed)
Lumbar DDD, overall doing okay with chiropractic manipulation. She is having trouble fitting this into her schedule so I did offer manipulation with one of our DOs here. She will call to make an appointment if desired, home condition given, return as needed.

## 2022-01-19 ENCOUNTER — Encounter: Payer: Self-pay | Admitting: Sports Medicine

## 2022-01-23 DIAGNOSIS — M5441 Lumbago with sciatica, right side: Secondary | ICD-10-CM | POA: Diagnosis not present

## 2022-01-23 DIAGNOSIS — M4724 Other spondylosis with radiculopathy, thoracic region: Secondary | ICD-10-CM | POA: Diagnosis not present

## 2022-01-23 DIAGNOSIS — M4728 Other spondylosis with radiculopathy, sacral and sacrococcygeal region: Secondary | ICD-10-CM | POA: Diagnosis not present

## 2022-01-23 DIAGNOSIS — M4723 Other spondylosis with radiculopathy, cervicothoracic region: Secondary | ICD-10-CM | POA: Diagnosis not present

## 2022-01-25 DIAGNOSIS — M4724 Other spondylosis with radiculopathy, thoracic region: Secondary | ICD-10-CM | POA: Diagnosis not present

## 2022-01-25 DIAGNOSIS — M4728 Other spondylosis with radiculopathy, sacral and sacrococcygeal region: Secondary | ICD-10-CM | POA: Diagnosis not present

## 2022-01-25 DIAGNOSIS — M4723 Other spondylosis with radiculopathy, cervicothoracic region: Secondary | ICD-10-CM | POA: Diagnosis not present

## 2022-01-25 DIAGNOSIS — M5441 Lumbago with sciatica, right side: Secondary | ICD-10-CM | POA: Diagnosis not present

## 2022-01-30 DIAGNOSIS — M4728 Other spondylosis with radiculopathy, sacral and sacrococcygeal region: Secondary | ICD-10-CM | POA: Diagnosis not present

## 2022-01-30 DIAGNOSIS — M4723 Other spondylosis with radiculopathy, cervicothoracic region: Secondary | ICD-10-CM | POA: Diagnosis not present

## 2022-01-30 DIAGNOSIS — M4724 Other spondylosis with radiculopathy, thoracic region: Secondary | ICD-10-CM | POA: Diagnosis not present

## 2022-01-30 DIAGNOSIS — M5441 Lumbago with sciatica, right side: Secondary | ICD-10-CM | POA: Diagnosis not present

## 2022-01-31 ENCOUNTER — Ambulatory Visit (INDEPENDENT_AMBULATORY_CARE_PROVIDER_SITE_OTHER): Payer: BC Managed Care – PPO | Admitting: Physician Assistant

## 2022-01-31 ENCOUNTER — Encounter: Payer: Self-pay | Admitting: Physician Assistant

## 2022-01-31 VITALS — BP 103/69 | HR 80 | Ht 67.0 in | Wt 193.0 lb

## 2022-01-31 DIAGNOSIS — F411 Generalized anxiety disorder: Secondary | ICD-10-CM | POA: Diagnosis not present

## 2022-01-31 MED ORDER — SERTRALINE HCL 50 MG PO TABS
50.0000 mg | ORAL_TABLET | Freq: Every day | ORAL | 0 refills | Status: DC
Start: 2022-01-31 — End: 2022-05-02

## 2022-01-31 NOTE — Progress Notes (Signed)
Established Patient Office Visit  Subjective   Patient ID: Joanna Baker, female    DOB: 1971/03/15  Age: 51 y.o. MRN: [id number]  Chief Complaint  Patient presents with   Follow-up    Mood follow up    HPI 51 year old female who presents to the clinic to follow-up on anxiety and obsessive thoughts.  Patient was started on Zoloft.  Zoloft full tab at bedtime was making her too groggy in the morning.  She started one half tab in the morning and one half tab at night and seems to be okay.  She denies any other side effects.  She does feel like her anxiety has decreased.  In fact she almost feels like she does not care enough.  She denies any suicidal thoughts or homicidal idealizations.  She has not been able to start counseling yet.  They have reached out to her.  Patient Active Problem List   Diagnosis Date Noted   Medial epicondylitis, left 01/11/2022   Atrial septal defect    Hypotension 06/19/2021   Trouble in sleeping 01/29/2021   Atypical chest pain 01/23/2021   Radiculitis of right cervical region 11/30/2020   Wheezing 11/21/2020   Chronic right shoulder pain 01/06/2020   Cough 01/06/2020   Environmental allergies 01/06/2020   Seasonal allergies 01/06/2020   BPPV (benign paroxysmal positional vertigo), left 12/12/2018   Bone pain 12/12/2018   Hand eczema 05/16/2018   Pain of right thumb 05/16/2018   Pain of right heel 05/16/2018   Multiple joint pain 05/16/2018   Lymphadenopathy 05/16/2018   Right peroneal tendinosis 02/26/2018   PVC's (premature ventricular contractions) 10/27/2017   Atypical ductal hyperplasia of left breast 08/05/2017   Breast calcification, left 07/26/2017   SOB (shortness of breath) 12/24/2016   Palpitations 12/24/2016   B12 deficiency 12/24/2016   Right ankle swelling 12/24/2016   Family history of CHF (congestive heart failure) 12/24/2016   Pernicious anemia 05/22/2016   Vitamin D insufficiency 05/22/2016   DUB (dysfunctional uterine  bleeding) 05/21/2016   Right-sided chest pain 05/18/2016   Radicular syndrome of right leg 05/18/2016   Pulsatile tinnitus of left ear 05/18/2016   Mass of mandible 09/02/2015   ACL tear 07/19/2015   Premenstrual symptom 11/07/2014   Depression 12/11/2013   GAD (generalized anxiety disorder) 12/11/2013   Fatty liver disease, nonalcoholic 71/69/6789   Right lumbar radiculitis 01/13/2013   Hemorrhoid 08/03/2011   Past Medical History:  Diagnosis Date   ACL injury tear    Right knee, not repaired.     Anxiety    Asthma    Depression    Fatty liver    IBS (irritable bowel syndrome)    TB (tuberculosis), treated     6 month of INH at age 46   Family History  Problem Relation Age of Onset   Pulmonary embolism Mother    Stroke Mother 51   Alcohol abuse Father    Suicidality Father    Heart failure Father    Alcoholism Father    Diabetes Father    Heart attack Maternal Grandmother    Depression Paternal Grandfather    Breast cancer Neg Hx    Allergies  Allergen Reactions   Influenza Vaccines Anaphylaxis   Iodine Anaphylaxis   Chantix [Varenicline Tartrate] Other (See Comments)    Mood changes/extreme made her want to kill herself   Lobster [Shellfish Allergy] Itching   Penicillins Hives   Prednisone Other (See Comments)    Mood changes.   Prozac [  Fluoxetine Hcl]     Palpitations.   Sulfa Antibiotics Hives      Review of Systems  All other systems reviewed and are negative.     Objective:     BP 103/69   Pulse 80   Ht '5\' 7"'$  (1.702 m)   Wt 193 lb (87.5 kg)   SpO2 100%   BMI 30.23 kg/m  BP Readings from Last 3 Encounters:  01/31/22 103/69  12/20/21 119/71  12/04/21 90/64      Physical Exam Pulmonary:     Effort: Pulmonary effort is normal.  Neurological:     Mental Status: She is oriented to person, place, and time.  Psychiatric:        Mood and Affect: Mood normal.      ..    12/20/2021    9:15 AM 06/16/2021   10:29 AM 01/23/2021   10:55  AM 11/16/2020    9:14 AM 03/16/2020    9:57 AM  Depression screen PHQ 2/9  Decreased Interest 1 0 0 0 0  Down, Depressed, Hopeless 1 0 0 0 0  PHQ - 2 Score 2 0 0 0 0  Altered sleeping 1 0     Tired, decreased energy 1 1     Change in appetite 1 0     Feeling bad or failure about yourself  3 1     Trouble concentrating 0 0     Moving slowly or fidgety/restless 0 1     Suicidal thoughts 1 0     PHQ-9 Score 9 3     Difficult doing work/chores Somewhat difficult Not difficult at all      ..    12/20/2021    9:18 AM 06/16/2021   10:29 AM 01/06/2020   10:38 AM 12/10/2018    9:59 AM  GAD 7 : Generalized Anxiety Score  Nervous, Anxious, on Edge '2 1 1 '$ 0  Control/stop worrying 1 0 0 0  Worry too much - different things 1 1 0 0  Trouble relaxing 3 1 0 0  Restless 2 1 0 0  Easily annoyed or irritable '1 1 1 2  '$ Afraid - awful might happen 1 0 0 0  Total GAD 7 Score '11 5 2 2  '$ Anxiety Difficulty Somewhat difficult Somewhat difficult Not difficult at all Somewhat difficult       Assessment & Plan:  Marland KitchenMarland KitchenAzha was seen today for follow-up.  Diagnoses and all orders for this visit:  GAD (generalized anxiety disorder) -     sertraline (ZOLOFT) 50 MG tablet; Take 1 tablet (50 mg total) by mouth at bedtime.   GAD-7 much better 11 to 3.  Stay at zoloft '50mg'$  at bedtime. Hopefully she can start counseling soon Encouraged mindfullness and exercise Follow up in 3 months   Iran Planas, PA-C

## 2022-02-01 DIAGNOSIS — M4728 Other spondylosis with radiculopathy, sacral and sacrococcygeal region: Secondary | ICD-10-CM | POA: Diagnosis not present

## 2022-02-01 DIAGNOSIS — M5441 Lumbago with sciatica, right side: Secondary | ICD-10-CM | POA: Diagnosis not present

## 2022-02-01 DIAGNOSIS — M4724 Other spondylosis with radiculopathy, thoracic region: Secondary | ICD-10-CM | POA: Diagnosis not present

## 2022-02-01 DIAGNOSIS — M4723 Other spondylosis with radiculopathy, cervicothoracic region: Secondary | ICD-10-CM | POA: Diagnosis not present

## 2022-02-21 ENCOUNTER — Encounter: Payer: BC Managed Care – PPO | Admitting: Physician Assistant

## 2022-02-21 DIAGNOSIS — M542 Cervicalgia: Secondary | ICD-10-CM | POA: Diagnosis not present

## 2022-02-21 DIAGNOSIS — M5413 Radiculopathy, cervicothoracic region: Secondary | ICD-10-CM | POA: Diagnosis not present

## 2022-02-21 DIAGNOSIS — M62830 Muscle spasm of back: Secondary | ICD-10-CM | POA: Diagnosis not present

## 2022-02-21 DIAGNOSIS — M546 Pain in thoracic spine: Secondary | ICD-10-CM | POA: Diagnosis not present

## 2022-03-06 DIAGNOSIS — M4723 Other spondylosis with radiculopathy, cervicothoracic region: Secondary | ICD-10-CM | POA: Diagnosis not present

## 2022-03-06 DIAGNOSIS — M546 Pain in thoracic spine: Secondary | ICD-10-CM | POA: Diagnosis not present

## 2022-03-06 DIAGNOSIS — M62830 Muscle spasm of back: Secondary | ICD-10-CM | POA: Diagnosis not present

## 2022-03-06 DIAGNOSIS — M47816 Spondylosis without myelopathy or radiculopathy, lumbar region: Secondary | ICD-10-CM | POA: Diagnosis not present

## 2022-03-13 DIAGNOSIS — M47816 Spondylosis without myelopathy or radiculopathy, lumbar region: Secondary | ICD-10-CM | POA: Diagnosis not present

## 2022-03-13 DIAGNOSIS — M4723 Other spondylosis with radiculopathy, cervicothoracic region: Secondary | ICD-10-CM | POA: Diagnosis not present

## 2022-03-13 DIAGNOSIS — M546 Pain in thoracic spine: Secondary | ICD-10-CM | POA: Diagnosis not present

## 2022-03-13 DIAGNOSIS — M62830 Muscle spasm of back: Secondary | ICD-10-CM | POA: Diagnosis not present

## 2022-03-20 DIAGNOSIS — M546 Pain in thoracic spine: Secondary | ICD-10-CM | POA: Diagnosis not present

## 2022-03-20 DIAGNOSIS — M4723 Other spondylosis with radiculopathy, cervicothoracic region: Secondary | ICD-10-CM | POA: Diagnosis not present

## 2022-03-20 DIAGNOSIS — M47816 Spondylosis without myelopathy or radiculopathy, lumbar region: Secondary | ICD-10-CM | POA: Diagnosis not present

## 2022-03-20 DIAGNOSIS — M62830 Muscle spasm of back: Secondary | ICD-10-CM | POA: Diagnosis not present

## 2022-03-21 DIAGNOSIS — F4323 Adjustment disorder with mixed anxiety and depressed mood: Secondary | ICD-10-CM | POA: Diagnosis not present

## 2022-03-27 DIAGNOSIS — M47816 Spondylosis without myelopathy or radiculopathy, lumbar region: Secondary | ICD-10-CM | POA: Diagnosis not present

## 2022-03-27 DIAGNOSIS — M62830 Muscle spasm of back: Secondary | ICD-10-CM | POA: Diagnosis not present

## 2022-03-27 DIAGNOSIS — M4723 Other spondylosis with radiculopathy, cervicothoracic region: Secondary | ICD-10-CM | POA: Diagnosis not present

## 2022-03-27 DIAGNOSIS — M546 Pain in thoracic spine: Secondary | ICD-10-CM | POA: Diagnosis not present

## 2022-04-03 DIAGNOSIS — M62830 Muscle spasm of back: Secondary | ICD-10-CM | POA: Diagnosis not present

## 2022-04-03 DIAGNOSIS — M546 Pain in thoracic spine: Secondary | ICD-10-CM | POA: Diagnosis not present

## 2022-04-03 DIAGNOSIS — M47816 Spondylosis without myelopathy or radiculopathy, lumbar region: Secondary | ICD-10-CM | POA: Diagnosis not present

## 2022-04-03 DIAGNOSIS — M4723 Other spondylosis with radiculopathy, cervicothoracic region: Secondary | ICD-10-CM | POA: Diagnosis not present

## 2022-04-05 DIAGNOSIS — F4323 Adjustment disorder with mixed anxiety and depressed mood: Secondary | ICD-10-CM | POA: Diagnosis not present

## 2022-04-06 DIAGNOSIS — M4723 Other spondylosis with radiculopathy, cervicothoracic region: Secondary | ICD-10-CM | POA: Diagnosis not present

## 2022-04-06 DIAGNOSIS — M62830 Muscle spasm of back: Secondary | ICD-10-CM | POA: Diagnosis not present

## 2022-04-06 DIAGNOSIS — M47816 Spondylosis without myelopathy or radiculopathy, lumbar region: Secondary | ICD-10-CM | POA: Diagnosis not present

## 2022-04-06 DIAGNOSIS — M546 Pain in thoracic spine: Secondary | ICD-10-CM | POA: Diagnosis not present

## 2022-04-17 DIAGNOSIS — M546 Pain in thoracic spine: Secondary | ICD-10-CM | POA: Diagnosis not present

## 2022-04-17 DIAGNOSIS — M47816 Spondylosis without myelopathy or radiculopathy, lumbar region: Secondary | ICD-10-CM | POA: Diagnosis not present

## 2022-04-17 DIAGNOSIS — M4723 Other spondylosis with radiculopathy, cervicothoracic region: Secondary | ICD-10-CM | POA: Diagnosis not present

## 2022-04-17 DIAGNOSIS — M62830 Muscle spasm of back: Secondary | ICD-10-CM | POA: Diagnosis not present

## 2022-04-18 DIAGNOSIS — F4323 Adjustment disorder with mixed anxiety and depressed mood: Secondary | ICD-10-CM | POA: Diagnosis not present

## 2022-04-19 DIAGNOSIS — M546 Pain in thoracic spine: Secondary | ICD-10-CM | POA: Diagnosis not present

## 2022-04-19 DIAGNOSIS — M47816 Spondylosis without myelopathy or radiculopathy, lumbar region: Secondary | ICD-10-CM | POA: Diagnosis not present

## 2022-04-19 DIAGNOSIS — M4723 Other spondylosis with radiculopathy, cervicothoracic region: Secondary | ICD-10-CM | POA: Diagnosis not present

## 2022-04-19 DIAGNOSIS — M62830 Muscle spasm of back: Secondary | ICD-10-CM | POA: Diagnosis not present

## 2022-04-23 DIAGNOSIS — M4724 Other spondylosis with radiculopathy, thoracic region: Secondary | ICD-10-CM | POA: Diagnosis not present

## 2022-04-23 DIAGNOSIS — M4728 Other spondylosis with radiculopathy, sacral and sacrococcygeal region: Secondary | ICD-10-CM | POA: Diagnosis not present

## 2022-04-23 DIAGNOSIS — M4723 Other spondylosis with radiculopathy, cervicothoracic region: Secondary | ICD-10-CM | POA: Diagnosis not present

## 2022-04-23 DIAGNOSIS — M5441 Lumbago with sciatica, right side: Secondary | ICD-10-CM | POA: Diagnosis not present

## 2022-04-24 ENCOUNTER — Encounter: Payer: BC Managed Care – PPO | Admitting: Physician Assistant

## 2022-04-25 DIAGNOSIS — F4323 Adjustment disorder with mixed anxiety and depressed mood: Secondary | ICD-10-CM | POA: Diagnosis not present

## 2022-04-27 DIAGNOSIS — M4723 Other spondylosis with radiculopathy, cervicothoracic region: Secondary | ICD-10-CM | POA: Diagnosis not present

## 2022-04-27 DIAGNOSIS — M47816 Spondylosis without myelopathy or radiculopathy, lumbar region: Secondary | ICD-10-CM | POA: Diagnosis not present

## 2022-04-27 DIAGNOSIS — M546 Pain in thoracic spine: Secondary | ICD-10-CM | POA: Diagnosis not present

## 2022-04-27 DIAGNOSIS — M62830 Muscle spasm of back: Secondary | ICD-10-CM | POA: Diagnosis not present

## 2022-05-01 DIAGNOSIS — F4323 Adjustment disorder with mixed anxiety and depressed mood: Secondary | ICD-10-CM | POA: Diagnosis not present

## 2022-05-02 ENCOUNTER — Ambulatory Visit (INDEPENDENT_AMBULATORY_CARE_PROVIDER_SITE_OTHER): Payer: BC Managed Care – PPO | Admitting: Physician Assistant

## 2022-05-02 ENCOUNTER — Encounter: Payer: Self-pay | Admitting: Physician Assistant

## 2022-05-02 VITALS — BP 116/65 | HR 90 | Ht 67.0 in | Wt 204.0 lb

## 2022-05-02 DIAGNOSIS — Z13220 Encounter for screening for lipoid disorders: Secondary | ICD-10-CM

## 2022-05-02 DIAGNOSIS — I959 Hypotension, unspecified: Secondary | ICD-10-CM | POA: Diagnosis not present

## 2022-05-02 DIAGNOSIS — Z131 Encounter for screening for diabetes mellitus: Secondary | ICD-10-CM | POA: Diagnosis not present

## 2022-05-02 DIAGNOSIS — Z1329 Encounter for screening for other suspected endocrine disorder: Secondary | ICD-10-CM

## 2022-05-02 DIAGNOSIS — E538 Deficiency of other specified B group vitamins: Secondary | ICD-10-CM

## 2022-05-02 DIAGNOSIS — M2550 Pain in unspecified joint: Secondary | ICD-10-CM

## 2022-05-02 DIAGNOSIS — E559 Vitamin D deficiency, unspecified: Secondary | ICD-10-CM | POA: Diagnosis not present

## 2022-05-02 DIAGNOSIS — Z0000 Encounter for general adult medical examination without abnormal findings: Secondary | ICD-10-CM | POA: Diagnosis not present

## 2022-05-02 DIAGNOSIS — F411 Generalized anxiety disorder: Secondary | ICD-10-CM

## 2022-05-02 MED ORDER — DICLOFENAC SODIUM 1 % EX GEL: 4.0000 g | Freq: Four times a day (QID) | CUTANEOUS | 1 refills | Status: DC

## 2022-05-02 MED ORDER — "BD DISP NEEDLE 25G X 1"" MISC": 1 refills | Status: AC

## 2022-05-02 NOTE — Progress Notes (Signed)
Complete physical exam  Patient: Joanna Baker   DOB: Nov 25, 1970   51 y.o. Female  MRN: [id number]  Subjective:    Chief Complaint  Patient presents with   Annual Exam    Joanna Baker is a 51 y.o. female who presents today for a complete physical exam. She reports consuming a general diet. The patient does not participate in regular exercise at present. She generally feels well. She reports sleeping well. She does not have additional problems to discuss today.    Most recent fall risk assessment:    05/02/2022    9:01 AM  [place] in the past year? 1  Number falls in past yr: 1  Injury with Fall? 1  Risk for fall due to : History of fall(s)  Follow up Falls evaluation completed     Most recent depression screenings:    01/31/2022    9:17 AM 12/20/2021    9:15 AM  PHQ 2/9 Scores  PHQ - 2 Score 2 2  PHQ- 9 Score 8 9    Vision:Not within last year  and Dental: No current dental problems  Patient Active Problem List   Diagnosis Date Noted   Arthralgia 05/02/2022   Medial epicondylitis, left 01/11/2022   Atrial septal defect    Hypotension 06/19/2021   Trouble in sleeping 01/29/2021   Atypical chest pain 01/23/2021   Radiculitis of right cervical region 11/30/2020   Wheezing 11/21/2020   Chronic right shoulder pain 01/06/2020   Cough 01/06/2020   Environmental allergies 01/06/2020   Seasonal allergies 01/06/2020   BPPV (benign paroxysmal positional vertigo), left 12/12/2018   Bone pain 12/12/2018   Hand eczema 05/16/2018   Pain of right thumb 05/16/2018   Pain of right heel 05/16/2018   Multiple joint pain 05/16/2018   Lymphadenopathy 05/16/2018   Right peroneal tendinosis 02/26/2018   PVC's (premature ventricular contractions) 10/27/2017   Atypical ductal hyperplasia of left breast 08/05/2017   Breast calcification, left 07/26/2017   SOB (shortness of breath) 12/24/2016   Palpitations 12/24/2016   B12 deficiency 12/24/2016   Right ankle swelling  12/24/2016   Family history of CHF (congestive heart failure) 12/24/2016   Pernicious anemia 05/22/2016   Vitamin D insufficiency 05/22/2016   DUB (dysfunctional uterine bleeding) 05/21/2016   Right-sided chest pain 05/18/2016   Radicular syndrome of right leg 05/18/2016   Pulsatile tinnitus of left ear 05/18/2016   Mass of mandible 09/02/2015   ACL tear 07/19/2015   Premenstrual symptom 11/07/2014   Depression 12/11/2013   GAD (generalized anxiety disorder) 12/11/2013   Fatty liver disease, nonalcoholic 26/94/8546   Right lumbar radiculitis 01/13/2013   Hemorrhoid 08/03/2011   Past Medical History:  Diagnosis Date   ACL injury tear    Right knee, not repaired.     Anxiety    Asthma    Depression    Fatty liver    IBS (irritable bowel syndrome)    TB (tuberculosis), treated     6 month of INH at age 37   Family History  Problem Relation Age of Onset   Pulmonary embolism Mother    Stroke Mother 57   Alcohol abuse Father    Suicidality Father    Heart failure Father    Alcoholism Father    Diabetes Father    Heart attack Maternal Grandmother    Depression Paternal Grandfather    Breast cancer Neg Hx    Allergies  Allergen Reactions   Influenza Vaccines Anaphylaxis  Iodine Anaphylaxis   Chantix [Varenicline Tartrate] Other (See Comments)    Mood changes/extreme made her want to kill herself   Lobster [Shellfish Allergy] Itching   Penicillins Hives   Prednisone Other (See Comments)    Mood changes.   Prozac [Fluoxetine Hcl]     Palpitations.   Sulfa Antibiotics Hives      Patient Care Team: Lavada Mesi as PCP - General (Family Medicine)   Outpatient Medications Prior to Visit  Medication Sig   CALCIUM-MAGNESIUM-VITAMIN D PO Take 1 capsule by mouth in the morning and at bedtime. Calcium 333 mg, Vit D 5 mcg, Mag 133 mg, Zinc 5 mg   cyanocobalamin (,VITAMIN B-12,) 1000 MCG/ML injection ADMINISTER 1 ML(1000 MCG) IN THE MUSCLE EVERY 30 DAYS    cyclobenzaprine (FLEXERIL) 10 MG tablet 1/2-1 tab p.o. nightly   ibuprofen (ADVIL) 800 MG tablet Take 800 mg by mouth every 8 (eight) hours as needed (pain.).   ipratropium (ATROVENT) 0.06 % nasal spray PLACE 2 SPRAYS INTO BOTH NOSTRILS 4 TIMES DAILY.   LORazepam (ATIVAN) 0.5 MG tablet Take 1 tablet (0.5 mg total) by mouth every 8 (eight) hours as needed. for anxiety   naproxen sodium (ANAPROX) 220 MG tablet Take 220 mg by mouth daily as needed (Pain).   sertraline (ZOLOFT) 25 MG tablet Take 25 mg by mouth daily.   UNABLE TO FIND Free and Easy [facility] - [place]   [DISCONTINUED] calcium-vitamin D (OSCAL WITH D) 500-200 MG-UNIT TABS tablet Take 1 tablet by mouth 2 (two) times daily.   [DISCONTINUED] sertraline (ZOLOFT) 50 MG tablet Take 1 tablet (50 mg total) by mouth at bedtime.   EPINEPHrine 0.3 mg/0.3 mL IJ SOAJ injection Inject 0.3 mg into the muscle as needed for anaphylaxis. (Patient not taking: Reported on 05/02/2022)   No facility-administered medications prior to visit.    ROS  Joint pain intermittently in neck, hands, back.       Objective:     BP 116/65   Pulse 90   Ht '5\' 7"'$  (1.702 m)   Wt 204 lb (92.5 kg)   SpO2 98%   BMI 31.95 kg/m  BP Readings from Last 3 Encounters:  05/02/22 116/65  01/31/22 103/69  12/20/21 119/71   Wt Readings from Last 3 Encounters:  05/02/22 204 lb (92.5 kg)  01/31/22 193 lb (87.5 kg)  12/20/21 192 lb (87.1 kg)      Physical Exam     BP 116/65   Pulse 90   Ht '5\' 7"'$  (1.702 m)   Wt 204 lb (92.5 kg)   SpO2 98%   BMI 31.95 kg/m   General Appearance:    Alert, cooperative, no distress, appears stated age  Head:    Normocephalic, without obvious abnormality, atraumatic  Eyes:    PERRL, conjunctiva/corneas clear, EOM's intact, fundi    benign, both eyes  Ears:    Normal TM's and external ear canals, both ears  Nose:   Nares normal, septum midline, mucosa normal, no drainage    or sinus tenderness  Throat:   Lips, mucosa, and  tongue normal; teeth and gums normal  Neck:   Supple, symmetrical, trachea midline, no adenopathy;    thyroid:  no enlargement/tenderness/nodules; no carotid   bruit or JVD  Back:     Symmetric, no curvature, ROM normal, no CVA tenderness  Lungs:     Clear to auscultation bilaterally, respirations unlabored  Chest Wall:    No tenderness or deformity  Heart:    Regular rate and rhythm, S1 and S2 normal, no murmur, rub   or gallop     Abdomen:     Soft, non-tender, bowel sounds active all four quadrants,    no masses, no organomegaly        Extremities:   Extremities normal, atraumatic, no cyanosis or edema  Pulses:   2+ and symmetric all extremities  Skin:   Skin color, texture, turgor normal, no rashes or lesions  Lymph nodes:   Cervical, supraclavicular, and axillary nodes normal  Neurologic:   CNII-XII intact, normal strength, sensation and reflexes    throughout   Assessment & Plan:    Routine Health Maintenance and Physical Exam  Immunization History  Administered Date(s) Administered   DTaP 07/11/2013   Influenza Split 07/25/2012   Influenza-Unspecified 07/25/2012   Moderna Sars-Covid-2 Vaccination 06/06/2020   PFIZER(Purple Top)SARS-COV-2 Vaccination 10/15/2019, 11/09/2019   Tdap 07/11/2013, 12/10/2018    Health Maintenance  Topic Date Due   COVID-19 Vaccine (4 - Mixed Product risk series) 05/18/2022 (Originally 08/01/2020)   INFLUENZA VACCINE  10/14/2022 (Originally 02/13/2022)   MAMMOGRAM  05/27/2022   PAP SMEAR-Modifier  09/13/2023   COLONOSCOPY (Pts 45-39yr Insurance coverage will need to be confirmed)  06/29/2025   TETANUS/TDAP  12/09/2028   Hepatitis C Screening  Completed   HIV Screening  Completed   HPV VACCINES  Aged Out    Discussed health benefits of physical activity, and encouraged her to engage in regular exercise appropriate for her age and condition.  .Marland KitchenSeth Bakewas seen today for annual exam.  Diagnoses and all orders for this visit:  Routine  physical examination -     TSH -     Lipid Panel w/reflex Direct LDL -     COMPLETE METABOLIC PANEL WITH GFR -     CBC with Differential/Platelet -     Vitamin D (25 hydroxy) -     Vitamin B12  Vitamin B12 deficiency -     Vitamin B12 -     NEEDLE, DISP, 25 G (B-D DISP NEEDLE 25GX1") 25G X 1" MISC; Use with monthly B12 injections.  Vitamin D insufficiency -     Vitamin D (25 hydroxy)  Hypotension, unspecified hypotension type -     COMPLETE METABOLIC PANEL WITH GFR  Screening for diabetes mellitus -     COMPLETE METABOLIC PANEL WITH GFR  Screening for lipid disorders -     Lipid Panel w/reflex Direct LDL  Thyroid disorder screen -     TSH  Arthralgia, unspecified joint -     ANA Screen,IFA,Reflex Titer/Pattern,Reflex Mplx 11 Ab Cascade with IdentRA -     diclofenac Sodium (VOLTAREN) 1 % GEL; Apply 4 g topically 4 (four) times daily. To affected joint.  GAD (generalized anxiety disorder)   ..Marland KitchenDiscussed 150 minutes of exercise a week.  Encouraged vitamin D 1000 units and Calcium '1300mg'$  or 4 servings of dairy a day.  Fasting labs ordered today PHQ/GAD no concerns Pap UTD Mammogram due in November Colonoscopy UTD Declined flu/covid vaccines Follow up in 1 year  Discussed overall joint pains will check ANA with reflex Discussed turmeric and voltaren gel as needed Follow up for more intervention    JIran Planas PA-C

## 2022-05-02 NOTE — Patient Instructions (Signed)

## 2022-05-03 DIAGNOSIS — M546 Pain in thoracic spine: Secondary | ICD-10-CM | POA: Diagnosis not present

## 2022-05-03 DIAGNOSIS — M62830 Muscle spasm of back: Secondary | ICD-10-CM | POA: Diagnosis not present

## 2022-05-03 DIAGNOSIS — M47816 Spondylosis without myelopathy or radiculopathy, lumbar region: Secondary | ICD-10-CM | POA: Diagnosis not present

## 2022-05-03 DIAGNOSIS — M4723 Other spondylosis with radiculopathy, cervicothoracic region: Secondary | ICD-10-CM | POA: Diagnosis not present

## 2022-05-04 ENCOUNTER — Encounter: Payer: Self-pay | Admitting: Physician Assistant

## 2022-05-04 NOTE — Progress Notes (Signed)
Joanna Baker,   Thyroid looks great.  Kidney, liver, glucose look good.  Hemoglobin and WBC look good.  Vitamin D low normal. Make sure taking 2000 units a day with dairy or fatty food to help absorb better.  B12 low normal. Increase to every 3 week injections.  Cholesterol stable and looks good. Overall CV risk is low.   Marland Kitchen.The 10-year ASCVD risk score (Arnett DK, et al., 2019) is: 0.6%   Values used to calculate the score:     Age: 51 years     Sex: Female     Is Non-Hispanic African American: No     Diabetic: No     Tobacco smoker: No     Systolic Blood Pressure: 919 mmHg     Is BP treated: No     HDL Cholesterol: 74 mg/dL     Total Cholesterol: 193 mg/dL  ANA pending.

## 2022-05-09 DIAGNOSIS — F4323 Adjustment disorder with mixed anxiety and depressed mood: Secondary | ICD-10-CM | POA: Diagnosis not present

## 2022-05-09 LAB — CBC WITH DIFFERENTIAL/PLATELET
Absolute Monocytes: 400 cells/uL (ref 200–950)
Basophils Absolute: 19 cells/uL (ref 0–200)
Basophils Relative: 0.4 %
Eosinophils Absolute: 80 cells/uL (ref 15–500)
Eosinophils Relative: 1.7 %
HCT: 43.1 % (ref 35.0–45.0)
Hemoglobin: 14.1 g/dL (ref 11.7–15.5)
Lymphs Abs: 1476 cells/uL (ref 850–3900)
MCH: 30.3 pg (ref 27.0–33.0)
MCHC: 32.7 g/dL (ref 32.0–36.0)
MCV: 92.7 fL (ref 80.0–100.0)
MPV: 10.6 fL (ref 7.5–12.5)
Monocytes Relative: 8.5 %
Neutro Abs: 2726 cells/uL (ref 1500–7800)
Neutrophils Relative %: 58 %
Platelets: 222 10*3/uL (ref 140–400)
RBC: 4.65 10*6/uL (ref 3.80–5.10)
RDW: 12.4 % (ref 11.0–15.0)
Total Lymphocyte: 31.4 %
WBC: 4.7 10*3/uL (ref 3.8–10.8)

## 2022-05-09 LAB — COMPLETE METABOLIC PANEL WITH GFR
AG Ratio: 1.8 (calc) (ref 1.0–2.5)
ALT: 26 U/L (ref 6–29)
AST: 24 U/L (ref 10–35)
Albumin: 4.5 g/dL (ref 3.6–5.1)
Alkaline phosphatase (APISO): 51 U/L (ref 31–125)
BUN: 14 mg/dL (ref 7–25)
CO2: 30 mmol/L (ref 20–32)
Calcium: 9.6 mg/dL (ref 8.6–10.2)
Chloride: 104 mmol/L (ref 98–110)
Creat: 0.75 mg/dL (ref 0.50–0.99)
Globulin: 2.5 g/dL (calc) (ref 1.9–3.7)
Glucose, Bld: 95 mg/dL (ref 65–99)
Potassium: 4.5 mmol/L (ref 3.5–5.3)
Sodium: 141 mmol/L (ref 135–146)
Total Bilirubin: 0.4 mg/dL (ref 0.2–1.2)
Total Protein: 7 g/dL (ref 6.1–8.1)
eGFR: 98 mL/min/{1.73_m2} (ref >=60)

## 2022-05-09 LAB — ANA SCREEN,IFA,REFLEX TITER/PATTERN,REFLEX MPLX 11 AB CASCADE
14-3-3 eta Protein: <0.2 ng/mL (ref <0.2)
Anti Nuclear Antibody (ANA): NEGATIVE
Cyclic Citrullin Peptide Ab: <16 UNITS
Rheumatoid fact SerPl-aCnc: <14 IU/mL (ref <14)

## 2022-05-09 LAB — LIPID PANEL W/REFLEX DIRECT LDL
Cholesterol: 193 mg/dL (ref <200)
HDL: 74 mg/dL (ref >=50)
LDL Cholesterol (Calc): 105 mg/dL (calc) — ABNORMAL HIGH
Non-HDL Cholesterol (Calc): 119 mg/dL (calc) (ref <130)
Total CHOL/HDL Ratio: 2.6 (calc) (ref <5.0)
Triglycerides: 55 mg/dL (ref <150)

## 2022-05-09 LAB — VITAMIN B12: Vitamin B-12: 366 pg/mL (ref 200–1100)

## 2022-05-09 LAB — VITAMIN D 25 HYDROXY (VIT D DEFICIENCY, FRACTURES): Vit D, 25-Hydroxy: 31 ng/mL (ref 30–100)

## 2022-05-09 LAB — TSH: TSH: 2.04 mIU/L

## 2022-05-09 NOTE — Progress Notes (Signed)
ANA is negative. GREAT news. No signs of autoimmune diseases.

## 2022-05-10 DIAGNOSIS — M47816 Spondylosis without myelopathy or radiculopathy, lumbar region: Secondary | ICD-10-CM | POA: Diagnosis not present

## 2022-05-10 DIAGNOSIS — M546 Pain in thoracic spine: Secondary | ICD-10-CM | POA: Diagnosis not present

## 2022-05-10 DIAGNOSIS — M62830 Muscle spasm of back: Secondary | ICD-10-CM | POA: Diagnosis not present

## 2022-05-10 DIAGNOSIS — M4723 Other spondylosis with radiculopathy, cervicothoracic region: Secondary | ICD-10-CM | POA: Diagnosis not present

## 2022-05-16 DIAGNOSIS — M4728 Other spondylosis with radiculopathy, sacral and sacrococcygeal region: Secondary | ICD-10-CM | POA: Diagnosis not present

## 2022-05-16 DIAGNOSIS — M4723 Other spondylosis with radiculopathy, cervicothoracic region: Secondary | ICD-10-CM | POA: Diagnosis not present

## 2022-05-16 DIAGNOSIS — M5441 Lumbago with sciatica, right side: Secondary | ICD-10-CM | POA: Diagnosis not present

## 2022-05-16 DIAGNOSIS — M4724 Other spondylosis with radiculopathy, thoracic region: Secondary | ICD-10-CM | POA: Diagnosis not present

## 2022-05-16 DIAGNOSIS — F4323 Adjustment disorder with mixed anxiety and depressed mood: Secondary | ICD-10-CM | POA: Diagnosis not present

## 2022-05-18 ENCOUNTER — Other Ambulatory Visit: Payer: Self-pay | Admitting: Physician Assistant

## 2022-05-18 DIAGNOSIS — F411 Generalized anxiety disorder: Secondary | ICD-10-CM

## 2022-05-18 NOTE — Telephone Encounter (Signed)
Called patient, received request for 50 mg of Zoloft but last visit she stated she was taking 25  mg. Per patient that is correct - 25 mg daily. She does not need refills right now. I sent 25 mg with a note for pharmacy to hold until she needs this filled. Patient aware.

## 2022-05-23 DIAGNOSIS — M5441 Lumbago with sciatica, right side: Secondary | ICD-10-CM | POA: Diagnosis not present

## 2022-05-23 DIAGNOSIS — M4728 Other spondylosis with radiculopathy, sacral and sacrococcygeal region: Secondary | ICD-10-CM | POA: Diagnosis not present

## 2022-05-23 DIAGNOSIS — F4323 Adjustment disorder with mixed anxiety and depressed mood: Secondary | ICD-10-CM | POA: Diagnosis not present

## 2022-05-23 DIAGNOSIS — M4724 Other spondylosis with radiculopathy, thoracic region: Secondary | ICD-10-CM | POA: Diagnosis not present

## 2022-05-23 DIAGNOSIS — M4723 Other spondylosis with radiculopathy, cervicothoracic region: Secondary | ICD-10-CM | POA: Diagnosis not present

## 2022-05-30 DIAGNOSIS — F4323 Adjustment disorder with mixed anxiety and depressed mood: Secondary | ICD-10-CM | POA: Diagnosis not present

## 2022-06-06 DIAGNOSIS — F4323 Adjustment disorder with mixed anxiety and depressed mood: Secondary | ICD-10-CM | POA: Diagnosis not present

## 2022-06-13 DIAGNOSIS — F4323 Adjustment disorder with mixed anxiety and depressed mood: Secondary | ICD-10-CM | POA: Diagnosis not present

## 2022-06-20 DIAGNOSIS — M4728 Other spondylosis with radiculopathy, sacral and sacrococcygeal region: Secondary | ICD-10-CM | POA: Diagnosis not present

## 2022-06-20 DIAGNOSIS — F4323 Adjustment disorder with mixed anxiety and depressed mood: Secondary | ICD-10-CM | POA: Diagnosis not present

## 2022-06-20 DIAGNOSIS — M5441 Lumbago with sciatica, right side: Secondary | ICD-10-CM | POA: Diagnosis not present

## 2022-06-20 DIAGNOSIS — M4723 Other spondylosis with radiculopathy, cervicothoracic region: Secondary | ICD-10-CM | POA: Diagnosis not present

## 2022-06-20 DIAGNOSIS — M4724 Other spondylosis with radiculopathy, thoracic region: Secondary | ICD-10-CM | POA: Diagnosis not present

## 2022-06-21 ENCOUNTER — Telehealth: Payer: BC Managed Care – PPO | Admitting: Emergency Medicine

## 2022-06-21 ENCOUNTER — Encounter: Payer: Self-pay | Admitting: Physician Assistant

## 2022-06-21 DIAGNOSIS — R52 Pain, unspecified: Secondary | ICD-10-CM | POA: Diagnosis not present

## 2022-06-21 DIAGNOSIS — R6883 Chills (without fever): Secondary | ICD-10-CM | POA: Diagnosis not present

## 2022-06-21 DIAGNOSIS — R0981 Nasal congestion: Secondary | ICD-10-CM | POA: Diagnosis not present

## 2022-06-21 DIAGNOSIS — J101 Influenza due to other identified influenza virus with other respiratory manifestations: Secondary | ICD-10-CM | POA: Diagnosis not present

## 2022-06-21 DIAGNOSIS — Z20822 Contact with and (suspected) exposure to covid-19: Secondary | ICD-10-CM | POA: Diagnosis not present

## 2022-06-21 DIAGNOSIS — R051 Acute cough: Secondary | ICD-10-CM

## 2022-06-21 DIAGNOSIS — R509 Fever, unspecified: Secondary | ICD-10-CM | POA: Diagnosis not present

## 2022-06-21 NOTE — Progress Notes (Signed)
Hello Drea,    Thank you for the additional information.    I'm worried about your coughing up brown phlegm after only being sick less than 2 days. You symptoms don't sound like influenza and I'm worried about what you are coughing up. Can you be seen in person in an urgent care today? I think someone needs to listen to you and check on you in a way that I can't with just an Evisit.      NOTE: There will be NO CHARGE for this eVisit   If you are having a true medical emergency please call 911.      For an urgent face to face visit, [facility] has seven urgent care centers for your convenience:     [facility] [place] Get Driving Directions [phone] [address] [place], [place] [address]    [facility]) Get Driving Directions [phone] [place], [place] [address]   [facility] [place] [facility]) Get Driving Directions [phone] [address] [place],  [place]  [address]   [facility] - at [address] Get Driving Directions  [phone] [address] [place],  [place] [address]     [facility] Get Driving Directions [phone] [address] [place], [place], [place] [address]   [facility] Get Driving Directions  [phone] [address] [place], [place] [address]   [facility] Get Driving Directions [phone] [address], [place], [place] [address]   Your MyChart E-visit questionnaire answers were reviewed by a board certified advanced clinical practitioner to complete your personal care plan based on your specific symptoms.  Thank you for using e-Visits.

## 2022-06-22 ENCOUNTER — Encounter: Payer: Self-pay | Admitting: Physician Assistant

## 2022-06-27 DIAGNOSIS — M4728 Other spondylosis with radiculopathy, sacral and sacrococcygeal region: Secondary | ICD-10-CM | POA: Diagnosis not present

## 2022-06-27 DIAGNOSIS — M4723 Other spondylosis with radiculopathy, cervicothoracic region: Secondary | ICD-10-CM | POA: Diagnosis not present

## 2022-06-27 DIAGNOSIS — M5441 Lumbago with sciatica, right side: Secondary | ICD-10-CM | POA: Diagnosis not present

## 2022-06-27 DIAGNOSIS — M4724 Other spondylosis with radiculopathy, thoracic region: Secondary | ICD-10-CM | POA: Diagnosis not present

## 2022-06-27 DIAGNOSIS — F4323 Adjustment disorder with mixed anxiety and depressed mood: Secondary | ICD-10-CM | POA: Diagnosis not present

## 2022-07-04 DIAGNOSIS — F4323 Adjustment disorder with mixed anxiety and depressed mood: Secondary | ICD-10-CM | POA: Diagnosis not present

## 2022-07-11 DIAGNOSIS — F4323 Adjustment disorder with mixed anxiety and depressed mood: Secondary | ICD-10-CM | POA: Diagnosis not present

## 2022-07-18 DIAGNOSIS — F4323 Adjustment disorder with mixed anxiety and depressed mood: Secondary | ICD-10-CM | POA: Diagnosis not present

## 2022-07-25 DIAGNOSIS — F4323 Adjustment disorder with mixed anxiety and depressed mood: Secondary | ICD-10-CM | POA: Diagnosis not present

## 2022-07-25 DIAGNOSIS — M4728 Other spondylosis with radiculopathy, sacral and sacrococcygeal region: Secondary | ICD-10-CM | POA: Diagnosis not present

## 2022-07-25 DIAGNOSIS — M4723 Other spondylosis with radiculopathy, cervicothoracic region: Secondary | ICD-10-CM | POA: Diagnosis not present

## 2022-07-25 DIAGNOSIS — M4724 Other spondylosis with radiculopathy, thoracic region: Secondary | ICD-10-CM | POA: Diagnosis not present

## 2022-07-25 DIAGNOSIS — M5441 Lumbago with sciatica, right side: Secondary | ICD-10-CM | POA: Diagnosis not present

## 2022-08-01 DIAGNOSIS — F4323 Adjustment disorder with mixed anxiety and depressed mood: Secondary | ICD-10-CM | POA: Diagnosis not present

## 2022-08-08 ENCOUNTER — Ambulatory Visit: Payer: BC Managed Care – PPO | Admitting: Physician Assistant

## 2022-08-08 ENCOUNTER — Encounter: Payer: Self-pay | Admitting: Physician Assistant

## 2022-08-08 ENCOUNTER — Ambulatory Visit (INDEPENDENT_AMBULATORY_CARE_PROVIDER_SITE_OTHER): Payer: BC Managed Care – PPO | Admitting: Physician Assistant

## 2022-08-08 DIAGNOSIS — M4728 Other spondylosis with radiculopathy, sacral and sacrococcygeal region: Secondary | ICD-10-CM | POA: Diagnosis not present

## 2022-08-08 DIAGNOSIS — M5441 Lumbago with sciatica, right side: Secondary | ICD-10-CM | POA: Diagnosis not present

## 2022-08-08 DIAGNOSIS — M4723 Other spondylosis with radiculopathy, cervicothoracic region: Secondary | ICD-10-CM | POA: Diagnosis not present

## 2022-08-08 DIAGNOSIS — M4724 Other spondylosis with radiculopathy, thoracic region: Secondary | ICD-10-CM | POA: Diagnosis not present

## 2022-08-08 DIAGNOSIS — M5412 Radiculopathy, cervical region: Secondary | ICD-10-CM

## 2022-08-08 MED ORDER — CYCLOBENZAPRINE HCL 10 MG PO TABS: ORAL_TABLET | ORAL | 1 refills | Status: DC

## 2022-08-08 NOTE — Progress Notes (Signed)
   Established Patient Office Visit  Subjective   Patient ID: Joanna Baker, female    DOB: 03-07-1971  Age: 52 y.o. MRN: [id number]  Chief Complaint  Patient presents with  . fmla paperwork    HPI November moderna Wednesday Thursday Flu December 12/7 flu A missed 2 days [facility] off day  Oncology   .Marland Kitchen Active Ambulatory Problems    Diagnosis Date Noted  . Hemorrhoid 08/03/2011  . Right lumbar radiculitis 01/13/2013  . Fatty liver disease, nonalcoholic 31/54/0086  . Depression 12/11/2013  . GAD (generalized anxiety disorder) 12/11/2013  . Premenstrual symptom 11/07/2014  . ACL tear 07/19/2015  . Mass of mandible 09/02/2015  . Right-sided chest pain 05/18/2016  . Radicular syndrome of right leg 05/18/2016  . Pulsatile tinnitus of left ear 05/18/2016  . DUB (dysfunctional uterine bleeding) 05/21/2016  . Pernicious anemia 05/22/2016  . Vitamin D insufficiency 05/22/2016  . SOB (shortness of breath) 12/24/2016  . Palpitations 12/24/2016  . B12 deficiency 12/24/2016  . Right ankle swelling 12/24/2016  . Family history of CHF (congestive heart failure) 12/24/2016  . Breast calcification, left 07/26/2017  . Atypical ductal hyperplasia of left breast 08/05/2017  . PVC's (premature ventricular contractions) 10/27/2017  . Right peroneal tendinosis 02/26/2018  . Hand eczema 05/16/2018  . Pain of right thumb 05/16/2018  . Pain of right heel 05/16/2018  . Multiple joint pain 05/16/2018  . Lymphadenopathy 05/16/2018  . BPPV (benign paroxysmal positional vertigo), left 12/12/2018  . Bone pain 12/12/2018  . Chronic right shoulder pain 01/06/2020  . Cough 01/06/2020  . Environmental allergies 01/06/2020  . Seasonal allergies 01/06/2020  . Wheezing 11/21/2020  . Radiculitis of right cervical region 11/30/2020  . Atypical chest pain 01/23/2021  . Trouble in sleeping 01/29/2021  . Hypotension 06/19/2021  . Atrial septal defect   . Medial epicondylitis, left 01/11/2022  . Arthralgia  05/02/2022   Resolved Ambulatory Problems    Diagnosis Date Noted  . Right leg pain 02/09/2015  . Left knee injury 07/08/2015  . RUQ pain 09/02/2015   Past Medical History:  Diagnosis Date  . ACL injury tear   . Anxiety   . Asthma   . Fatty liver   . IBS (irritable bowel syndrome)   . TB (tuberculosis), treated       ROS See HPI.    Objective:     BP 109/61 (BP Location: Right Arm, Patient Position: Sitting, Cuff Size: Large)   Pulse 100   Ht '5\' 7"'$  (1.702 m)   Wt 205 lb (93 kg)   SpO2 100%   BMI 32.11 kg/m  BP Readings from Last 3 Encounters:  08/08/22 109/61  05/02/22 116/65  01/31/22 103/69   Wt Readings from Last 3 Encounters:  08/08/22 205 lb (93 kg)  05/02/22 204 lb (92.5 kg)  01/31/22 193 lb (87.5 kg)      Physical Exam    Assessment & Plan:    Iran Planas, PA-C

## 2022-08-10 ENCOUNTER — Encounter: Payer: Self-pay | Admitting: Physician Assistant

## 2022-08-15 DIAGNOSIS — F4323 Adjustment disorder with mixed anxiety and depressed mood: Secondary | ICD-10-CM | POA: Diagnosis not present

## 2022-08-16 DIAGNOSIS — M5441 Lumbago with sciatica, right side: Secondary | ICD-10-CM | POA: Diagnosis not present

## 2022-08-16 DIAGNOSIS — M4728 Other spondylosis with radiculopathy, sacral and sacrococcygeal region: Secondary | ICD-10-CM | POA: Diagnosis not present

## 2022-08-16 DIAGNOSIS — M4724 Other spondylosis with radiculopathy, thoracic region: Secondary | ICD-10-CM | POA: Diagnosis not present

## 2022-08-16 DIAGNOSIS — M4723 Other spondylosis with radiculopathy, cervicothoracic region: Secondary | ICD-10-CM | POA: Diagnosis not present

## 2022-08-22 DIAGNOSIS — F4323 Adjustment disorder with mixed anxiety and depressed mood: Secondary | ICD-10-CM | POA: Diagnosis not present

## 2022-08-29 DIAGNOSIS — F4323 Adjustment disorder with mixed anxiety and depressed mood: Secondary | ICD-10-CM | POA: Diagnosis not present

## 2022-09-05 DIAGNOSIS — F4323 Adjustment disorder with mixed anxiety and depressed mood: Secondary | ICD-10-CM | POA: Diagnosis not present

## 2022-09-06 DIAGNOSIS — M4724 Other spondylosis with radiculopathy, thoracic region: Secondary | ICD-10-CM | POA: Diagnosis not present

## 2022-09-06 DIAGNOSIS — M5441 Lumbago with sciatica, right side: Secondary | ICD-10-CM | POA: Diagnosis not present

## 2022-09-06 DIAGNOSIS — M4728 Other spondylosis with radiculopathy, sacral and sacrococcygeal region: Secondary | ICD-10-CM | POA: Diagnosis not present

## 2022-09-06 DIAGNOSIS — M4723 Other spondylosis with radiculopathy, cervicothoracic region: Secondary | ICD-10-CM | POA: Diagnosis not present

## 2022-09-12 DIAGNOSIS — M4724 Other spondylosis with radiculopathy, thoracic region: Secondary | ICD-10-CM | POA: Diagnosis not present

## 2022-09-12 DIAGNOSIS — M5441 Lumbago with sciatica, right side: Secondary | ICD-10-CM | POA: Diagnosis not present

## 2022-09-12 DIAGNOSIS — M4723 Other spondylosis with radiculopathy, cervicothoracic region: Secondary | ICD-10-CM | POA: Diagnosis not present

## 2022-09-12 DIAGNOSIS — M4728 Other spondylosis with radiculopathy, sacral and sacrococcygeal region: Secondary | ICD-10-CM | POA: Diagnosis not present

## 2022-09-19 DIAGNOSIS — F4323 Adjustment disorder with mixed anxiety and depressed mood: Secondary | ICD-10-CM | POA: Diagnosis not present

## 2022-09-26 DIAGNOSIS — M5441 Lumbago with sciatica, right side: Secondary | ICD-10-CM | POA: Diagnosis not present

## 2022-09-26 DIAGNOSIS — M4723 Other spondylosis with radiculopathy, cervicothoracic region: Secondary | ICD-10-CM | POA: Diagnosis not present

## 2022-09-26 DIAGNOSIS — M4728 Other spondylosis with radiculopathy, sacral and sacrococcygeal region: Secondary | ICD-10-CM | POA: Diagnosis not present

## 2022-09-26 DIAGNOSIS — M4724 Other spondylosis with radiculopathy, thoracic region: Secondary | ICD-10-CM | POA: Diagnosis not present

## 2022-09-27 ENCOUNTER — Other Ambulatory Visit: Payer: BC Managed Care – PPO

## 2022-09-27 ENCOUNTER — Encounter: Payer: Self-pay | Admitting: Physician Assistant

## 2022-09-27 ENCOUNTER — Ambulatory Visit (INDEPENDENT_AMBULATORY_CARE_PROVIDER_SITE_OTHER): Payer: BC Managed Care – PPO

## 2022-09-27 ENCOUNTER — Ambulatory Visit
Admission: EM | Admit: 2022-09-27 | Discharge: 2022-09-27 | Disposition: A | Discharge status: Home / Self Care | Payer: BC Managed Care – PPO | Attending: Urgent Care | Admitting: Urgent Care

## 2022-09-27 ENCOUNTER — Encounter: Payer: Self-pay | Admitting: Urgent Care

## 2022-09-27 DIAGNOSIS — R1011 Right upper quadrant pain: Secondary | ICD-10-CM | POA: Diagnosis not present

## 2022-09-27 DIAGNOSIS — R198 Other specified symptoms and signs involving the digestive system and abdomen: Secondary | ICD-10-CM

## 2022-09-27 DIAGNOSIS — R109 Unspecified abdominal pain: Secondary | ICD-10-CM

## 2022-09-27 LAB — POCT URINALYSIS DIP (MANUAL ENTRY)
Bilirubin, UA: NEGATIVE
Glucose, UA: NEGATIVE mg/dL
Ketones, POC UA: NEGATIVE mg/dL
Leukocytes, UA: NEGATIVE
Nitrite, UA: NEGATIVE
Protein Ur, POC: NEGATIVE mg/dL
Spec Grav, UA: 1.015 (ref 1.010–1.025)
Urobilinogen, UA: 0.2 E.U./dL
pH, UA: 7.5 (ref 5.0–8.0)

## 2022-09-27 NOTE — ED Provider Notes (Signed)
[name]    CSN: [id number] Arrival date & time: 09/27/22  1308      History   Chief Complaint Chief Complaint  Patient presents with   Back Pain    HPI Joanna Baker is a 52 y.o. female.   Pleasant 52 year old female presents today due to concerns of right upper quadrant and right flank pain that started on Monday.  Patient states that she normally eats a vegan diet, but went out to eat on Sunday and ate fish and food that was fried in duck fat.  She woke up on Monday with a sharp flank pain and occasional right upper quadrant abdominal pain.  She states she had something similar 8 years ago after eating pork grinds.  8 years ago however, it resolved to an Epsom salt bath.  Patient states she took her Epsom salt bath with no improvement.  She states the pain has been constant since Monday with intermittent episodes of more severe sharp shooting pains.  She denies nausea or vomiting.  No fever.  She has been trying numerous supplements such as apple cider vinegar, milk thistle and chamomile teas. She denies a rash. She has increased her water intake significantly so reports some increase in urination, but denies dysuria, odor or cloudy urine. Does have a known hx of DDD for which she follows with chiropractic therapy. Had a treatment on Tuesday and states she did seem to get 1 hour of relief from her back pain, but otherwise nothing has helped. Pt reports concern that her BM this morning was "fluffy" and darker than normal which concerned her for blood. She also states there was a bit of blood on the toilet paper.   Back Pain   Past Medical History:  Diagnosis Date   ACL injury tear    Right knee, not repaired.     Anxiety    Asthma    Depression    Fatty liver    IBS (irritable bowel syndrome)    TB (tuberculosis), treated     6 month of INH at age 19    Patient Active Problem List   Diagnosis Date Noted   Arthralgia 05/02/2022   Medial epicondylitis, left  01/11/2022   Atrial septal defect    Hypotension 06/19/2021   Trouble in sleeping 01/29/2021   Atypical chest pain 01/23/2021   Radiculitis of right cervical region 11/30/2020   Wheezing 11/21/2020   Chronic right shoulder pain 01/06/2020   Cough 01/06/2020   Environmental allergies 01/06/2020   Seasonal allergies 01/06/2020   BPPV (benign paroxysmal positional vertigo), left 12/12/2018   Bone pain 12/12/2018   Hand eczema 05/16/2018   Pain of right thumb 05/16/2018   Pain of right heel 05/16/2018   Multiple joint pain 05/16/2018   Lymphadenopathy 05/16/2018   Right peroneal tendinosis 02/26/2018   PVC's (premature ventricular contractions) 10/27/2017   Atypical ductal hyperplasia of left breast 08/05/2017   Breast calcification, left 07/26/2017   SOB (shortness of breath) 12/24/2016   Palpitations 12/24/2016   B12 deficiency 12/24/2016   Right ankle swelling 12/24/2016   Family history of CHF (congestive heart failure) 12/24/2016   Pernicious anemia 05/22/2016   Vitamin D insufficiency 05/22/2016   DUB (dysfunctional uterine bleeding) 05/21/2016   Right-sided chest pain 05/18/2016   Radicular syndrome of right leg 05/18/2016   Pulsatile tinnitus of left ear 05/18/2016   Mass of mandible 09/02/2015   ACL tear 07/19/2015   Premenstrual symptom 11/07/2014   Depression 12/11/2013  GAD (generalized anxiety disorder) 12/11/2013   Fatty liver disease, nonalcoholic [id number]   Right lumbar radiculitis 01/13/2013   Hemorrhoid 08/03/2011    Past Surgical History:  Procedure Laterality Date   COLONOSCOPY     excision cells breast Left    TEE WITHOUT CARDIOVERSION N/A 08/15/2021   Procedure: TRANSESOPHAGEAL ECHOCARDIOGRAM (TEE);  Surgeon: Sanda Klein, MD;  Location: [facility] ENDOSCOPY;  Service: Cardiovascular;  Laterality: N/A;   TONSILLECTOMY  07/17/1991    OB History   No obstetric history on file.      Home Medications    Prior to Admission medications    Medication Sig Start Date End Date Taking? Authorizing Provider  CALCIUM-MAGNESIUM-VITAMIN D PO Take 1 capsule by mouth in the morning and at bedtime. Calcium 333 mg, Vit D 5 mcg, Mag 133 mg, Zinc 5 mg    [provider]  cyanocobalamin (,VITAMIN B-12,) 1000 MCG/ML injection ADMINISTER 1 ML(1000 MCG) IN THE MUSCLE EVERY 30 DAYS 12/20/21   Breeback, Jade L, PA-C  cyclobenzaprine (FLEXERIL) 10 MG tablet 1/2-1 tab p.o. nightly as needed. 08/08/22   Breeback, Luvenia Starch L, PA-C  diclofenac Sodium (VOLTAREN) 1 % GEL Apply 4 g topically 4 (four) times daily. To affected joint. 05/02/22   Breeback, Jade L, PA-C  ibuprofen (ADVIL) 800 MG tablet Take 800 mg by mouth every 8 (eight) hours as needed (pain.). 07/14/21   [provider]  ipratropium (ATROVENT) 0.06 % nasal spray PLACE 2 SPRAYS INTO BOTH NOSTRILS 4 TIMES DAILY. 12/20/21   Breeback, Jade L, PA-C  LORazepam (ATIVAN) 0.5 MG tablet Take 1 tablet (0.5 mg total) by mouth every 8 (eight) hours as needed. for anxiety 04/13/21   Iran Planas L, PA-C  naproxen sodium (ANAPROX) 220 MG tablet Take 220 mg by mouth daily as needed (Pain).    [provider]  NEEDLE, DISP, 25 G (B-D DISP NEEDLE 25GX1") 25G X 1" MISC Use with monthly B12 injections. 05/02/22   Breeback, Jade L, PA-C  sertraline (ZOLOFT) 25 MG tablet Take 1 tablet (25 mg total) by mouth daily. 05/18/22   Donella Stade, PA-C  UNABLE TO FIND Free and [place]    [provider]    Family History Family History  Problem Relation Age of Onset   Pulmonary embolism Mother    Stroke Mother 80   Alcohol abuse Father    Suicidality Father    Heart failure Father    Alcoholism Father    Diabetes Father    Heart attack Maternal Grandmother    Depression Paternal Grandfather    Breast cancer Neg Hx     Social History Social History   Tobacco Use   Smoking status: Former    Packs/day: 0.25    Years: 5.00    Additional pack years: 0.00     Total pack years: 1.25    Types: Cigarettes   Smokeless tobacco: Never  Vaping Use   Vaping Use: Never used  Substance Use Topics   Alcohol use: Yes    Alcohol/week: 2.0 standard drinks of alcohol    Types: 2 Cans of beer per week    Comment: Occasional   Drug use: Not Currently    Types: Marijuana    Comment: history of use, not current use     Allergies   Influenza vaccines, Iodine, Chantix [varenicline tartrate], Lobster [shellfish allergy], Penicillins, Prednisone, Prozac [fluoxetine hcl], and Sulfa antibiotics   Review of Systems Review of Systems  Musculoskeletal:  Positive for back pain.  As per HPI   Physical Exam Triage Vital Signs ED Triage Vitals  Enc Vitals Group     BP 09/27/22 1325 100/61     Pulse Rate 09/27/22 1325 74     Resp 09/27/22 1325 20     Temp 09/27/22 1325 98.5 F (36.9 C)     Temp Source 09/27/22 1325 Oral     SpO2 09/27/22 1325 98 %     Weight 09/27/22 1321 200 lb (90.7 kg)     Height 09/27/22 1321 '5\' 7"'$  (1.702 m)     Head Circumference --      Peak Flow --      Pain Score 09/27/22 1321 3     Pain Loc --      Pain Edu? --      Excl. in [place]? --    No data found.  Updated Vital Signs BP 100/61 (BP Location: Right Arm)   Pulse 74   Temp 98.5 F (36.9 C) (Oral)   Resp 20   Ht '5\' 7"'$  (1.702 m)   Wt 200 lb (90.7 kg)   LMP  (LMP Unknown)   SpO2 98%   BMI 31.32 kg/m   Visual Acuity Right Eye Distance:   Left Eye Distance:   Bilateral Distance:    Right Eye Near:   Left Eye Near:    Bilateral Near:     Physical Exam Vitals and nursing note reviewed.  Constitutional:      General: She is not in acute distress.    Appearance: Normal appearance. She is well-developed. She is obese. She is not ill-appearing, toxic-appearing or diaphoretic.  HENT:     Head: Normocephalic and atraumatic.  Eyes:     Conjunctiva/sclera: Conjunctivae normal.  Cardiovascular:     Rate and Rhythm: Normal rate and regular rhythm.     Heart sounds:  No murmur heard. Pulmonary:     Effort: Pulmonary effort is normal. No respiratory distress.     Breath sounds: Normal breath sounds.  Abdominal:     General: Abdomen is flat. Bowel sounds are normal. There is no distension.     Palpations: Abdomen is soft. There is no mass.     Tenderness: There is no abdominal tenderness (some discomfort to RUQ with deep palpation; normal percussion. Negative murphy sign). There is right CVA tenderness. There is no left CVA tenderness, guarding or rebound.  Musculoskeletal:        General: Tenderness (pt did appear tender to palpation of inferiormost ribs posteriorly) present. No swelling.     Cervical back: Normal range of motion and neck supple. No rigidity.  Skin:    General: Skin is warm and dry.     Capillary Refill: Capillary refill takes less than 2 seconds.     Coloration: Skin is not jaundiced or pale.     Findings: No bruising, erythema or rash.  Neurological:     General: No focal deficit present.     Mental Status: She is alert and oriented to person, place, and time.     Sensory: No sensory deficit.  Psychiatric:        Mood and Affect: Mood normal.      UC Treatments / Results  Labs (all labs ordered are listed, but only abnormal results are displayed) Labs Reviewed  POCT URINALYSIS DIP (MANUAL ENTRY) - Abnormal; Notable for the following components:      Result Value   Blood, UA trace-intact (*)  All other components within normal limits  COMPREHENSIVE METABOLIC PANEL  CBC WITH DIFFERENTIAL/PLATELET    EKG   Radiology US Abdomen Limited RUQ (LIVER/GB)  Result Date: 09/27/2022 CLINICAL DATA:  Acute flank pain EXAM: ULTRASOUND ABDOMEN LIMITED RIGHT UPPER QUADRANT COMPARISON:  Abdominal ultrasound complete 09/02/2015 FINDINGS: Gallbladder: No gallstones or wall thickening visualized. No sonographic Murphy sign noted by sonographer. Common bile duct: Diameter: 4.8 mm Liver: No focal lesion identified. Increase in parenchymal  echogenicity. Portal vein is patent on color Doppler imaging with normal direction of blood flow towards the liver. Other: None. IMPRESSION: 1. Increased echogenicity of the hepatic parenchyma is a nonspecific indicator of hepatocellular dysfunction, most commonly steatosis. 2. No cholelithiasis or sonographic evidence for acute cholecystitis. Electronically Signed   By: Ronney Asters M.D.   On: 09/27/2022 15:16    Procedures Procedures (including critical care time)  Medications Ordered in UC Medications - No data to display  Initial Impression / Assessment and Plan / UC Course  I have reviewed the triage vital signs and the nursing notes.  Pertinent labs & imaging results that were available during my care of the patient were reviewed by me and considered in my medical decision making (see chart for details).     RUQ/ R flank pain -patient did seem uncomfortable with palpation to the ribs and musculature.  With a history of degenerative disc disease, I question if this is radicular in nature from her T-spine.  I offered her a T-spine x-ray which she declined.  She does have Flexeril at home which she did not try for this.  I recommended she try moist heat, Flexeril, Tylenol particularly since her chiropractic treatment offered her some relief, this favors it being a musculoskeletal cause.  Differentials were discussed.  Urine was negative for infection.  Ultrasound negative for gallbladder issues.  I do not suspect this to be a colitis, abdominal exam was unremarkable.  Will draw labs including CMP and CBC to further assess. Change in bowel movements -this started immediately after taking milk thistle.  GI symptoms are commonly seen, therefore this was recommended to be stopped. Please follow up with PCP or head to ER for any acute changes.   Final Clinical Impressions(s) / UC Diagnoses   Final diagnoses:  RUQ pain  Change in bowel movement     Discharge Instructions      Your  ultrasound does not show evidence of a gallbladder infection or gallstone. It does likely represent fatty liver. I have obtained labs to further assess the cause of your symptoms. We will contact you to discuss any abnormalities.  Your urine appears normal. Please try a microwavable heat pack to the affected area of your back. I would also recommend trying 1000 mg of Tylenol in addition to your cyclobenzaprine. You may also consider a topical lidocaine patch such as the ones sold over-the-counter by [employer].  If you continue to have pain, or if you develop further changes in bowel movements or a fever, please return to clinic or head to the emergency room.     ED Prescriptions   None    PDMP not reviewed this encounter.   Chaney Malling, [place] 09/27/22 1536

## 2022-09-27 NOTE — Discharge Instructions (Addendum)
Your ultrasound does not show evidence of a gallbladder infection or gallstone. It does likely represent fatty liver. I have obtained labs to further assess the cause of your symptoms. We will contact you to discuss any abnormalities.  Your urine appears normal. Please try a microwavable heat pack to the affected area of your back. I would also recommend trying 1000 mg of Tylenol in addition to your cyclobenzaprine. You may also consider a topical lidocaine patch such as the ones sold over-the-counter by [employer].  If you continue to have pain, or if you develop further changes in bowel movements or a fever, please return to clinic or head to the emergency room.

## 2022-09-27 NOTE — ED Triage Notes (Addendum)
Pt presents to Urgent Care with c/o R back/flank pain x 3 days. Reports she believes her gallbladder is inflamed d/t eating meal high in fat prior to onset of pain. Also reports having blood on toilet paper this morning when wiping after BM. Denies urinary s/s. Denies nausea.

## 2022-09-28 LAB — COMPREHENSIVE METABOLIC PANEL
ALT: 18 IU/L (ref 0–32)
AST: 19 IU/L (ref 0–40)
Albumin/Globulin Ratio: 1.8 (ref 1.2–2.2)
Albumin: 4.2 g/dL (ref 3.9–4.9)
Alkaline Phosphatase: 54 IU/L (ref 44–121)
BUN/Creatinine Ratio: 6 — ABNORMAL LOW (ref 9–23)
BUN: 5 mg/dL — ABNORMAL LOW (ref 6–24)
Bilirubin Total: 0.4 mg/dL (ref 0.0–1.2)
CO2: 25 mmol/L (ref 20–29)
Calcium: 9.2 mg/dL (ref 8.7–10.2)
Chloride: 106 mmol/L (ref 96–106)
Creatinine, Ser: 0.77 mg/dL (ref 0.57–1.00)
Globulin, Total: 2.3 g/dL (ref 1.5–4.5)
Glucose: 93 mg/dL (ref 70–99)
Potassium: 4.1 mmol/L (ref 3.5–5.2)
Sodium: 145 mmol/L — ABNORMAL HIGH (ref 134–144)
Total Protein: 6.5 g/dL (ref 6.0–8.5)
eGFR: 95 mL/min/{1.73_m2} (ref >59)

## 2022-09-28 LAB — CBC WITH DIFFERENTIAL/PLATELET
Basophils Absolute: 0 10*3/uL (ref 0.0–0.2)
Basos: 0 %
EOS (ABSOLUTE): 0.1 10*3/uL (ref 0.0–0.4)
Eos: 2 %
Hematocrit: 40.9 % (ref 34.0–46.6)
Hemoglobin: 13.7 g/dL (ref 11.1–15.9)
Immature Grans (Abs): 0 10*3/uL (ref 0.0–0.1)
Immature Granulocytes: 0 %
Lymphocytes Absolute: 1.6 10*3/uL (ref 0.7–3.1)
Lymphs: 33 %
MCH: 30.5 pg (ref 26.6–33.0)
MCHC: 33.5 g/dL (ref 31.5–35.7)
MCV: 91 fL (ref 79–97)
Monocytes Absolute: 0.4 10*3/uL (ref 0.1–0.9)
Monocytes: 8 %
Neutrophils Absolute: 2.8 10*3/uL (ref 1.4–7.0)
Neutrophils: 57 %
Platelets: 260 10*3/uL (ref 150–450)
RBC: 4.49 x10E6/uL (ref 3.77–5.28)
RDW: 12.5 % (ref 11.7–15.4)
WBC: 4.8 10*3/uL (ref 3.4–10.8)

## 2022-10-02 ENCOUNTER — Ambulatory Visit: Payer: BC Managed Care – PPO | Admitting: Physician Assistant

## 2022-10-03 ENCOUNTER — Ambulatory Visit: Payer: BC Managed Care – PPO | Admitting: Physician Assistant

## 2022-10-03 ENCOUNTER — Encounter: Payer: Self-pay | Admitting: Physician Assistant

## 2022-10-03 VITALS — BP 119/65 | HR 83 | Ht 67.0 in | Wt 202.0 lb

## 2022-10-03 DIAGNOSIS — R1011 Right upper quadrant pain: Secondary | ICD-10-CM | POA: Diagnosis not present

## 2022-10-03 DIAGNOSIS — M19049 Primary osteoarthritis, unspecified hand: Secondary | ICD-10-CM | POA: Diagnosis not present

## 2022-10-03 DIAGNOSIS — F411 Generalized anxiety disorder: Secondary | ICD-10-CM | POA: Diagnosis not present

## 2022-10-03 DIAGNOSIS — K700 Alcoholic fatty liver: Secondary | ICD-10-CM | POA: Insufficient documentation

## 2022-10-03 DIAGNOSIS — F4323 Adjustment disorder with mixed anxiety and depressed mood: Secondary | ICD-10-CM | POA: Diagnosis not present

## 2022-10-03 NOTE — Progress Notes (Signed)
Established Patient Office Visit  Subjective   Patient ID: Joanna Baker, female    DOB: [birth year]  Age: 52 y.o. MRN: [id number]  Chief Complaint  Patient presents with   Abdominal Pain    HPI Pt is a 52 yo female who presents to the clinic to follow up from ED on 3/14 for right upper quadrant pain. Pt had eaten some fried fish and she normally eats vegan. Monday morning she work up with right upper quadrant pain and stool changes. Ultrasound of abdomen was negative for any gallbladder etiology but did show fatty liver and labs where unremarkable except for slight elevation in sodium. UA negative for infection.   Symptoms have resolved with milk thistle.   Pt is doing great with zoloft and anxiety. No problems or concerns. Her work is going better. She denies any side effects.   She is having some intermittent problems with fingers and arthritis. Not tried anything to make better.   She wants her shingles vaccine.   Active Ambulatory Problems    Diagnosis Date Noted   Hemorrhoid 08/03/2011   Right lumbar radiculitis 01/13/2013   Fatty liver disease, nonalcoholic [id number]   Depression 12/11/2013   GAD (generalized anxiety disorder) 12/11/2013   Premenstrual symptom 11/07/2014   ACL tear 07/19/2015   Mass of mandible 09/02/2015   Right upper quadrant abdominal pain 09/02/2015   Right-sided chest pain 05/18/2016   Radicular syndrome of right leg 05/18/2016   Pulsatile tinnitus of left ear 05/18/2016   DUB (dysfunctional uterine bleeding) 05/21/2016   Pernicious anemia 05/22/2016   Vitamin D insufficiency 05/22/2016   SOB (shortness of breath) 12/24/2016   Palpitations 12/24/2016   B12 deficiency 12/24/2016   Right ankle swelling 12/24/2016   Family history of CHF (congestive heart failure) 12/24/2016   Breast calcification, left 07/26/2017   Atypical ductal hyperplasia of left breast 08/05/2017   PVC's (premature ventricular contractions) 10/27/2017   Right peroneal  tendinosis 02/26/2018   Hand eczema 05/16/2018   Pain of right thumb 05/16/2018   Pain of right heel 05/16/2018   Multiple joint pain 05/16/2018   Lymphadenopathy 05/16/2018   BPPV (benign paroxysmal positional vertigo), left 12/12/2018   Bone pain 12/12/2018   Chronic right shoulder pain 01/06/2020   Cough 01/06/2020   Environmental allergies 01/06/2020   Seasonal allergies 01/06/2020   Wheezing 11/21/2020   Radiculitis of right cervical region 11/30/2020   Atypical chest pain 01/23/2021   Trouble in sleeping 01/29/2021   Hypotension 06/19/2021   Atrial septal defect    Medial epicondylitis, left 01/11/2022   Arthralgia 05/02/2022   Fatty liver, alcoholic [id number]   Arthritis of finger 10/03/2022   Resolved Ambulatory Problems    Diagnosis Date Noted   Right leg pain 02/09/2015   Left knee injury 07/08/2015   Past Medical History:  Diagnosis Date   ACL injury tear    Anxiety    Asthma    Fatty liver    IBS (irritable bowel syndrome)    TB (tuberculosis), treated       ROS See HPI.    Objective:     BP 119/65   Pulse 83   Ht 5\' 7"  (1.702 m)   Wt 202 lb (91.6 kg)   LMP  (LMP Unknown)   SpO2 99%   BMI 31.64 kg/m  BP Readings from Last 3 Encounters:  10/03/22 119/65  09/27/22 100/61  08/08/22 109/61   Wt Readings from Last 3 Encounters:  10/03/22 202 lb (91.6 kg)  09/27/22 200 lb (90.7 kg)  08/08/22 205 lb (93 kg)   ..    10/03/2022    9:34 AM 08/08/2022    1:33 PM 05/02/2022    9:01 AM 01/31/2022    9:17 AM 12/20/2021    9:15 AM  Depression screen PHQ 2/9  Decreased Interest 0 0 0 1 1  Down, Depressed, Hopeless 0 0 0 1 1  PHQ - 2 Score 0 0 0 2 2  Altered sleeping   0 2 1  Tired, decreased energy   1 1 1   Change in appetite   2 2 1   Feeling bad or failure about yourself    0 1 3  Trouble concentrating   0 0 0  Moving slowly or fidgety/restless   0 0 0  Suicidal thoughts   0 0 1  PHQ-9 Score   3 8 9   Difficult doing work/chores   Not  difficult at all Not difficult at all Somewhat difficult         Physical Exam Constitutional:      Appearance: She is well-developed.  HENT:     Head: Normocephalic.  Cardiovascular:     Rate and Rhythm: Normal rate and regular rhythm.  Pulmonary:     Effort: Pulmonary effort is normal.  Abdominal:     General: There is no distension.     Palpations: Abdomen is soft.     Tenderness: There is no abdominal tenderness.     Hernia: No hernia is present.  Musculoskeletal:     Comments: No tenderness, redness, or swelling to palpation over fingers.   Neurological:     General: No focal deficit present.     Mental Status: She is alert and oriented to person, place, and time.  Psychiatric:        Mood and Affect: Mood normal.      The 10-year ASCVD risk score (Arnett DK, et al., 2019) is: 0.6%    Assessment & Plan:  Marland KitchenMarland KitchenTaniaya was seen today for abdominal pain.  Diagnoses and all orders for this visit:  Right upper quadrant abdominal pain  GAD (generalized anxiety disorder)  Fatty liver, alcoholic  Arthritis of finger   Pt's GI symptoms have resolved.  Reassurance given.  Discussed fatty liver and diet and decrease alcohol use. Decrease from 1 bottle of wine a night to 2 glasses. Discussed arthritis of fingers-consider voltaren gel and nightly parafin wax treatments PHQ/GAD look good Continue zoloft Discussed shingles vaccine-not 52 yet but can schedule nurse visit for after the 24th of this month  Iran Planas, PA-C

## 2022-10-11 ENCOUNTER — Ambulatory Visit (INDEPENDENT_AMBULATORY_CARE_PROVIDER_SITE_OTHER): Payer: BC Managed Care – PPO | Admitting: Family Medicine

## 2022-10-11 VITALS — BP 113/61 | HR 76

## 2022-10-11 DIAGNOSIS — Z23 Encounter for immunization: Secondary | ICD-10-CM

## 2022-10-11 DIAGNOSIS — M4728 Other spondylosis with radiculopathy, sacral and sacrococcygeal region: Secondary | ICD-10-CM | POA: Diagnosis not present

## 2022-10-11 DIAGNOSIS — M4723 Other spondylosis with radiculopathy, cervicothoracic region: Secondary | ICD-10-CM | POA: Diagnosis not present

## 2022-10-11 DIAGNOSIS — M4724 Other spondylosis with radiculopathy, thoracic region: Secondary | ICD-10-CM | POA: Diagnosis not present

## 2022-10-11 DIAGNOSIS — M5441 Lumbago with sciatica, right side: Secondary | ICD-10-CM | POA: Diagnosis not present

## 2022-10-11 MED ORDER — EPINEPHRINE 0.3 MG/0.3ML IJ SOAJ: 0.3000 mg | INTRAMUSCULAR | 0 refills | Status: DC | PRN

## 2022-10-11 NOTE — Progress Notes (Signed)
Joanna Baker is here for her first Shingrix immunization. Denies current illness or past reaction to eggs. Shingrix vaccination to left deltoid with no apparent complications. Patient advised to call with any issues.   Patient asked for refill of Epi-pen, her current one is expired. Pended.

## 2022-10-11 NOTE — Progress Notes (Signed)
Agree with documentation as above.   [name], MD  

## 2022-10-16 DIAGNOSIS — M5441 Lumbago with sciatica, right side: Secondary | ICD-10-CM | POA: Diagnosis not present

## 2022-10-16 DIAGNOSIS — M4724 Other spondylosis with radiculopathy, thoracic region: Secondary | ICD-10-CM | POA: Diagnosis not present

## 2022-10-16 DIAGNOSIS — M4723 Other spondylosis with radiculopathy, cervicothoracic region: Secondary | ICD-10-CM | POA: Diagnosis not present

## 2022-10-16 DIAGNOSIS — M4728 Other spondylosis with radiculopathy, sacral and sacrococcygeal region: Secondary | ICD-10-CM | POA: Diagnosis not present

## 2022-10-17 DIAGNOSIS — F4323 Adjustment disorder with mixed anxiety and depressed mood: Secondary | ICD-10-CM | POA: Diagnosis not present

## 2022-10-30 IMAGING — MR MR CARD MORPHOLOGY WO/W CM
38 of 40 series · 38 of 40 positions shown · IV contrast (gadavist)
Comparison: none

CLINICAL DATA: Secundum ASD

EXAM:
CARDIAC MRI
TECHNIQUE: The patient was scanned on a 1.5 Tesla GE magnet. A dedicated
cardiac coil was used. Functional imaging was done using Fiesta
sequences. [DATE], and 4 chamber views were done to assess for RWMA's.
Modified Billie rule using a short axis stack was used to
calculate an ejection fraction on a dedicated work station using
Circle software. The patient received 10 cc of Gadavist. After 10
minutes inversion recovery sequences were used to assess for
infiltration and scar tissue.
CONTRAST:  Gadavist 10 cc

[Series 8: bSSFP · oblique · 8.0mm · 1.61mm/px · 1 of 25 slices shown (1 of 19)]
[im 1/25]
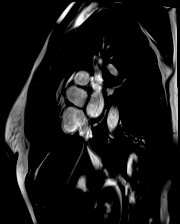

[Series 9: bSSFP · oblique · 8.0mm · 1.61mm/px · 1 of 25 slices shown (2 of 19)]
[im 1/25]
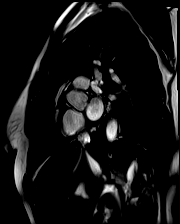

[Series 10: bSSFP · oblique · 8.0mm · 1.61mm/px · 1 of 25 slices shown (3 of 19)]
[im 1/25]
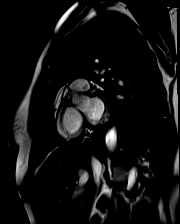

[Series 11: bSSFP · oblique · 8.0mm · 1.61mm/px · 1 of 25 slices shown (4 of 19)]
[im 1/25]
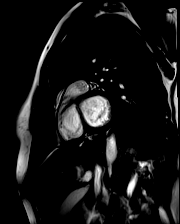

[Series 12: bSSFP · oblique · 8.0mm · 1.61mm/px · 1 of 25 slices shown (5 of 19)]
[im 1/25]
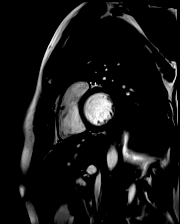

[Series 13: bSSFP · oblique · 8.0mm · 1.61mm/px · 1 of 25 slices shown (6 of 19)]
[im 1/25]
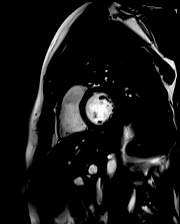

[Series 14: bSSFP · oblique · 8.0mm · 1.61mm/px · 1 of 25 slices shown (7 of 19)]
[im 1/25]
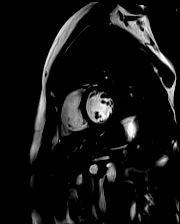

[Series 15: bSSFP · oblique · 8.0mm · 1.61mm/px · 1 of 25 slices shown (8 of 19)]
[im 1/25]
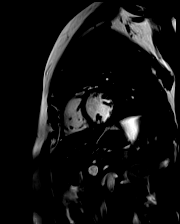

[Series 16: bSSFP · oblique · 8.0mm · 1.61mm/px · 1 of 25 slices shown (9 of 19)]
[im 1/25]
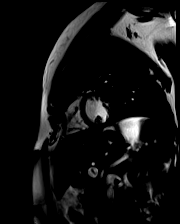

[Series 17: bSSFP · oblique · 8.0mm · 1.61mm/px · 1 of 25 slices shown (10 of 19)]
[im 1/25]
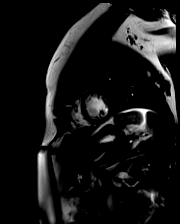

[Series 18: bSSFP · oblique · 8.0mm · 1.61mm/px · 1 of 25 slices shown (11 of 19)]
[im 1/25]
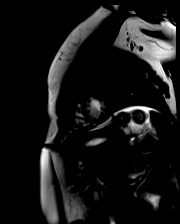

[Series 19: bSSFP · oblique · 8.0mm · 1.61mm/px · 1 of 25 slices shown (12 of 19)]
[im 1/25]
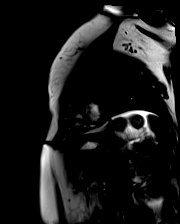

[Series 20: bSSFP · oblique · 8.0mm · 1.61mm/px · 1 of 25 slices shown (13 of 19)]
[im 1/25]
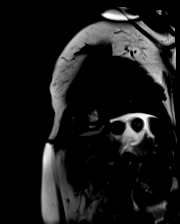

[Series 21: bSSFP · oblique · 8.0mm · 1.61mm/px · 1 of 25 slices shown (14 of 19)]
[im 1/25]
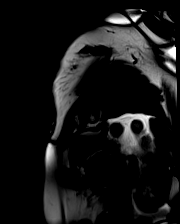

[Series 22: bSSFP · oblique · 8.0mm · 1.61mm/px · 1 of 25 slices shown (15 of 19)]
[im 1/25]
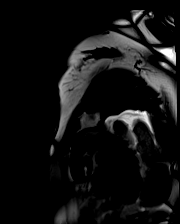

[Series 23: bSSFP · oblique · 8.0mm · 1.61mm/px · 1 of 25 slices shown (16 of 19)]
[im 1/25]
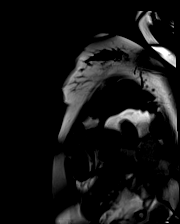

[Series 24: bSSFP · oblique · 6.0mm · 1.41mm/px · 1 of 25 slices shown (17 of 19)]
[im 1/25]
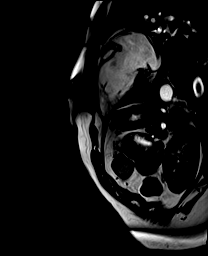

[Series 25: cor rvot · coronal · 6.0mm · 1.41mm/px · 1 of 25 slices shown]
[im 1/25]
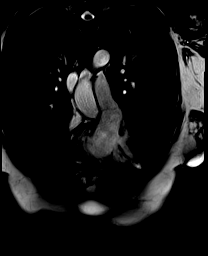

[Series 26: bSSFP · oblique · 6.0mm · 1.41mm/px · 1 of 25 slices shown (18 of 19)]
[im 1/25]
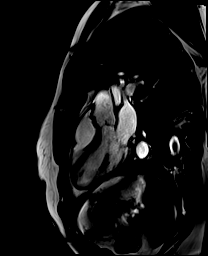

[Series 27: pulmonary valve flow_150_tp_retro_bh · oblique · 6.0mm · 1.73mm/px · 1 of 30 slices shown]
[im 1/30]
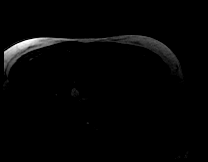

[Series 28: pulmonary valve flow_150_tp_retro_bh_mag · oblique · 6.0mm · 1.73mm/px · 1 of 30 slices shown]
[im 1/30]
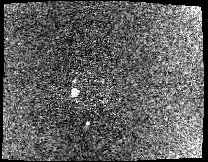

[Series 29: pulmonary valve flow_150_tp_retro_bh_p · oblique · 6.0mm · 1.73mm/px · 1 of 30 slices shown]
[im 1/30]
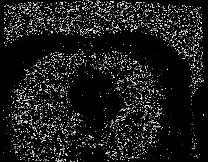

[Series 30: bSSFP · coronal · 6.0mm · 1.41mm/px · 1 of 25 slices shown (19 of 19)]
[im 1/25]
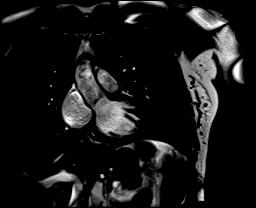

[Series 31: pulmonary valve flow_200_tp_retro_bh · oblique · 6.0mm · 1.73mm/px · 1 of 30 slices shown (1 of 2)]
[im 1/30]
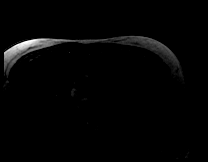

[Series 32: pulmonary valve flow_200_tp_retro_bh_mag · oblique · 6.0mm · 1.73mm/px · 1 of 26 slices shown (1 of 2)]
[im 1/26]
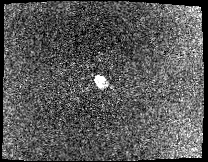

[Series 33: pulmonary valve flow_200_tp_retro_bh_p · oblique · 6.0mm · 1.73mm/px · 1 of 30 slices shown (1 of 2)]
[im 1/30]
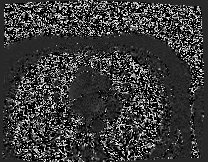

[Series 34: pulmonary valve flow_200_tp_retro_bh · oblique · 6.0mm · 1.73mm/px · 1 of 30 slices shown (2 of 2)]
[im 1/30]
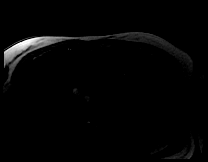

[Series 35: pulmonary valve flow_200_tp_retro_bh_mag · oblique · 6.0mm · 1.73mm/px · 1 of 30 slices shown (2 of 2)]
[im 1/30]
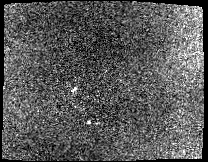

[Series 36: pulmonary valve flow_200_tp_retro_bh_p · oblique · 6.0mm · 1.73mm/px · 1 of 30 slices shown (2 of 2)]
[im 1/30]
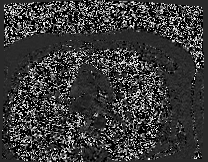

[Series 37: aortic valve flow_200_tp_retro_bh · axial · 6.0mm · 1.73mm/px · 1 of 30 slices shown (1 of 3)]
[im 1/30]
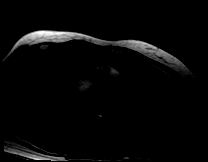

[Series 38: aortic valve flow_200_tp_retro_bh_mag · axial · 6.0mm · 1.73mm/px · 1 of 30 slices shown (1 of 3)]
[im 1/30]
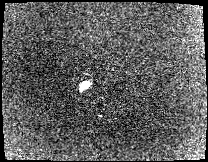

[Series 39: aortic valve flow_200_tp_retro_bh_p · axial · 6.0mm · 1.73mm/px · 1 of 30 slices shown (1 of 3)]
[im 1/30]
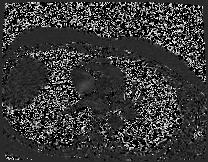

[Series 40: aortic valve flow_200_tp_retro_bh · axial · 6.0mm · 1.73mm/px · 1 of 30 slices shown (2 of 3)]
[im 1/30]
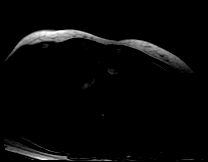

[Series 41: aortic valve flow_200_tp_retro_bh_mag · axial · 6.0mm · 1.73mm/px · 1 of 30 slices shown (2 of 3)]
[im 1/30]
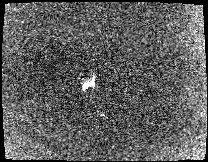

[Series 42: aortic valve flow_200_tp_retro_bh_p · axial · 6.0mm · 1.73mm/px · 1 of 30 slices shown (2 of 3)]
[im 1/30]
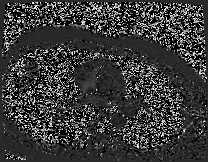

[Series 43: aortic valve flow_200_tp_retro_bh · axial · 6.0mm · 1.73mm/px · 1 of 30 slices shown (3 of 3)]
[im 1/30]
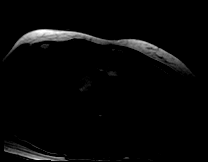

[Series 44: aortic valve flow_200_tp_retro_bh_mag · axial · 6.0mm · 1.73mm/px · 1 of 30 slices shown (3 of 3)]
[im 1/30]
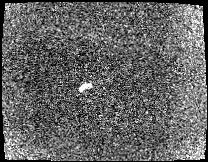

[Series 45: aortic valve flow_200_tp_retro_bh_p · axial · 6.0mm · 1.73mm/px · 1 of 30 slices shown (3 of 3)]
[im 1/30]
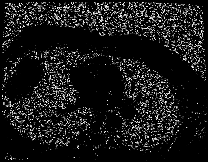

[38 of 40 positions shown; findings below may reference images not displayed]

FINDINGS: Limited images of the lung fields with no gross abnormalities.

Normal left ventricular size and wall thickness. Mild diffuse
hypokinesis, EF 51%. Normal right ventricular size and systolic
function, EF 46%. Normal left atrial size. Borderline dilated right
atrium. An atrial septal defect was not clearly visualized though in
one view there was possible left to right flow at the atrial level.
trileaflet aortic valve with no significant stenosis or
regurgitation. No significant mitral regurgitation.

On delayed enhancement imaging, there was no myocardial late
gadolinium enhancement (LGE).

MEASUREMENTS:
MEASUREMENTS
LVEDV 129 mL

LVSV 66 mL
LVEF 51%

RVEDV 136 mL
RVSV 63 mL
RVEF 46%

Aortic valve forward volume: 62 mL

Pulmonary valve forward volume: 67 mL

Qp/Qs
IMPRESSION: 1.  Normal LV size with mild diffuse hypokinesis, EF 51%.

2.  Normal RV size with EF 46% (normal).

3.  Borderline right atrial dilation.

4. A definite ASD was not visualized but spatial resolution by MRI
is not as good as BILLIE for this. Qp/Qs was 1.08, suggesting that if
an ASD is present, it is likely relatively small.

5.  No myocardial LGE.

Miyeon Masmoudi

## 2022-10-31 DIAGNOSIS — F4323 Adjustment disorder with mixed anxiety and depressed mood: Secondary | ICD-10-CM | POA: Diagnosis not present

## 2022-11-06 DIAGNOSIS — M4724 Other spondylosis with radiculopathy, thoracic region: Secondary | ICD-10-CM | POA: Diagnosis not present

## 2022-11-06 DIAGNOSIS — M5441 Lumbago with sciatica, right side: Secondary | ICD-10-CM | POA: Diagnosis not present

## 2022-11-06 DIAGNOSIS — M4723 Other spondylosis with radiculopathy, cervicothoracic region: Secondary | ICD-10-CM | POA: Diagnosis not present

## 2022-11-06 DIAGNOSIS — M4728 Other spondylosis with radiculopathy, sacral and sacrococcygeal region: Secondary | ICD-10-CM | POA: Diagnosis not present

## 2022-11-07 DIAGNOSIS — F4323 Adjustment disorder with mixed anxiety and depressed mood: Secondary | ICD-10-CM | POA: Diagnosis not present

## 2022-11-12 DIAGNOSIS — M4728 Other spondylosis with radiculopathy, sacral and sacrococcygeal region: Secondary | ICD-10-CM | POA: Diagnosis not present

## 2022-11-12 DIAGNOSIS — D235 Other benign neoplasm of skin of trunk: Secondary | ICD-10-CM | POA: Diagnosis not present

## 2022-11-12 DIAGNOSIS — L814 Other melanin hyperpigmentation: Secondary | ICD-10-CM | POA: Diagnosis not present

## 2022-11-12 DIAGNOSIS — M5441 Lumbago with sciatica, right side: Secondary | ICD-10-CM | POA: Diagnosis not present

## 2022-11-12 DIAGNOSIS — L981 Factitial dermatitis: Secondary | ICD-10-CM | POA: Diagnosis not present

## 2022-11-12 DIAGNOSIS — M4724 Other spondylosis with radiculopathy, thoracic region: Secondary | ICD-10-CM | POA: Diagnosis not present

## 2022-11-12 DIAGNOSIS — M4723 Other spondylosis with radiculopathy, cervicothoracic region: Secondary | ICD-10-CM | POA: Diagnosis not present

## 2022-11-12 DIAGNOSIS — D225 Melanocytic nevi of trunk: Secondary | ICD-10-CM | POA: Diagnosis not present

## 2022-11-14 DIAGNOSIS — F4323 Adjustment disorder with mixed anxiety and depressed mood: Secondary | ICD-10-CM | POA: Diagnosis not present

## 2022-11-21 DIAGNOSIS — F4323 Adjustment disorder with mixed anxiety and depressed mood: Secondary | ICD-10-CM | POA: Diagnosis not present

## 2022-11-28 DIAGNOSIS — F4323 Adjustment disorder with mixed anxiety and depressed mood: Secondary | ICD-10-CM | POA: Diagnosis not present

## 2022-12-03 ENCOUNTER — Encounter: Payer: Self-pay | Admitting: Physician Assistant

## 2022-12-05 DIAGNOSIS — F4323 Adjustment disorder with mixed anxiety and depressed mood: Secondary | ICD-10-CM | POA: Diagnosis not present

## 2022-12-11 ENCOUNTER — Ambulatory Visit (INDEPENDENT_AMBULATORY_CARE_PROVIDER_SITE_OTHER): Payer: BC Managed Care – PPO | Admitting: Family Medicine

## 2022-12-11 VITALS — BP 105/49 | HR 82 | Temp 97.6°F | Ht 67.0 in

## 2022-12-11 DIAGNOSIS — Z23 Encounter for immunization: Secondary | ICD-10-CM | POA: Diagnosis not present

## 2022-12-11 NOTE — Progress Notes (Signed)
Agree with documentation as above.   Anola Mcgough, MD  

## 2022-12-11 NOTE — Patient Instructions (Signed)
Shingrix series completed. Return at next scheduled visit with provider or as needed.

## 2022-12-11 NOTE — Progress Notes (Signed)
   Established Patient Office Visit  Subjective   Patient ID: Joanna Baker, female    DOB: 08-Nov-1970  Age: 52 y.o. MRN: [id number]  Chief Complaint  Patient presents with   2nd shingrix vaccine    Nurse visit for shingrix vaccine.     HPI  2nd shingrix vaccine. Patient denies any side effects or complications with initial Shingrix vaccine that was given on 10/11/22.   ROS    Objective:     BP (!) 105/49   Pulse 82   Temp 97.6 F (36.4 C)   Ht 5\' 7"  (1.702 m)   SpO2 98%   BMI 31.64 kg/m    Physical Exam   No results found for any visits on 12/11/22.    The 10-year ASCVD risk score (Arnett DK, et al., 2019) is: 0.6%    Assessment & Plan:  Admin shingrix vaccine -IM right deltoid. Patient tolerated injection well without complications.  Problem List Items Addressed This Visit   None Visit Diagnoses     Need for shingles vaccine    -  Primary   Relevant Orders   Varicella-zoster vaccine IM (Completed)       Return for  Return at next scheduled visit with provider or as needed. Elizabeth Palau, LPN

## 2022-12-12 DIAGNOSIS — F4323 Adjustment disorder with mixed anxiety and depressed mood: Secondary | ICD-10-CM | POA: Diagnosis not present

## 2022-12-13 DIAGNOSIS — Z6832 Body mass index (BMI) 32.0-32.9, adult: Secondary | ICD-10-CM | POA: Diagnosis not present

## 2022-12-13 DIAGNOSIS — Z01419 Encounter for gynecological examination (general) (routine) without abnormal findings: Secondary | ICD-10-CM | POA: Diagnosis not present

## 2022-12-13 LAB — HM PAP SMEAR: HM Pap smear: NEGATIVE

## 2022-12-15 DIAGNOSIS — M9904 Segmental and somatic dysfunction of sacral region: Secondary | ICD-10-CM | POA: Diagnosis not present

## 2022-12-15 DIAGNOSIS — M25551 Pain in right hip: Secondary | ICD-10-CM | POA: Diagnosis not present

## 2022-12-15 DIAGNOSIS — M9905 Segmental and somatic dysfunction of pelvic region: Secondary | ICD-10-CM | POA: Diagnosis not present

## 2022-12-15 DIAGNOSIS — M9903 Segmental and somatic dysfunction of lumbar region: Secondary | ICD-10-CM | POA: Diagnosis not present

## 2022-12-18 DIAGNOSIS — M9903 Segmental and somatic dysfunction of lumbar region: Secondary | ICD-10-CM | POA: Diagnosis not present

## 2022-12-18 DIAGNOSIS — M9905 Segmental and somatic dysfunction of pelvic region: Secondary | ICD-10-CM | POA: Diagnosis not present

## 2022-12-18 DIAGNOSIS — M9904 Segmental and somatic dysfunction of sacral region: Secondary | ICD-10-CM | POA: Diagnosis not present

## 2022-12-18 DIAGNOSIS — M25551 Pain in right hip: Secondary | ICD-10-CM | POA: Diagnosis not present

## 2022-12-19 ENCOUNTER — Encounter: Payer: Self-pay | Admitting: Sports Medicine

## 2022-12-19 DIAGNOSIS — F4323 Adjustment disorder with mixed anxiety and depressed mood: Secondary | ICD-10-CM | POA: Diagnosis not present

## 2022-12-22 DIAGNOSIS — M9904 Segmental and somatic dysfunction of sacral region: Secondary | ICD-10-CM | POA: Diagnosis not present

## 2022-12-22 DIAGNOSIS — M9903 Segmental and somatic dysfunction of lumbar region: Secondary | ICD-10-CM | POA: Diagnosis not present

## 2022-12-22 DIAGNOSIS — M25551 Pain in right hip: Secondary | ICD-10-CM | POA: Diagnosis not present

## 2022-12-22 DIAGNOSIS — M9905 Segmental and somatic dysfunction of pelvic region: Secondary | ICD-10-CM | POA: Diagnosis not present

## 2022-12-24 DIAGNOSIS — M9904 Segmental and somatic dysfunction of sacral region: Secondary | ICD-10-CM | POA: Diagnosis not present

## 2022-12-24 DIAGNOSIS — M9903 Segmental and somatic dysfunction of lumbar region: Secondary | ICD-10-CM | POA: Diagnosis not present

## 2022-12-24 DIAGNOSIS — M9905 Segmental and somatic dysfunction of pelvic region: Secondary | ICD-10-CM | POA: Diagnosis not present

## 2022-12-24 DIAGNOSIS — M25551 Pain in right hip: Secondary | ICD-10-CM | POA: Diagnosis not present

## 2022-12-25 DIAGNOSIS — M9903 Segmental and somatic dysfunction of lumbar region: Secondary | ICD-10-CM | POA: Diagnosis not present

## 2022-12-25 DIAGNOSIS — M9902 Segmental and somatic dysfunction of thoracic region: Secondary | ICD-10-CM | POA: Diagnosis not present

## 2022-12-25 DIAGNOSIS — M9905 Segmental and somatic dysfunction of pelvic region: Secondary | ICD-10-CM | POA: Diagnosis not present

## 2022-12-25 DIAGNOSIS — M9904 Segmental and somatic dysfunction of sacral region: Secondary | ICD-10-CM | POA: Diagnosis not present

## 2022-12-25 DIAGNOSIS — M25551 Pain in right hip: Secondary | ICD-10-CM | POA: Diagnosis not present

## 2022-12-26 ENCOUNTER — Other Ambulatory Visit: Payer: Self-pay | Admitting: Physician Assistant

## 2022-12-26 DIAGNOSIS — F4323 Adjustment disorder with mixed anxiety and depressed mood: Secondary | ICD-10-CM | POA: Diagnosis not present

## 2022-12-26 DIAGNOSIS — E538 Deficiency of other specified B group vitamins: Secondary | ICD-10-CM

## 2022-12-27 DIAGNOSIS — R92321 Mammographic fibroglandular density, right breast: Secondary | ICD-10-CM | POA: Diagnosis not present

## 2022-12-27 DIAGNOSIS — R92323 Mammographic fibroglandular density, bilateral breasts: Secondary | ICD-10-CM | POA: Diagnosis not present

## 2022-12-27 DIAGNOSIS — R928 Other abnormal and inconclusive findings on diagnostic imaging of breast: Secondary | ICD-10-CM | POA: Diagnosis not present

## 2022-12-29 DIAGNOSIS — M25551 Pain in right hip: Secondary | ICD-10-CM | POA: Diagnosis not present

## 2022-12-29 DIAGNOSIS — M9905 Segmental and somatic dysfunction of pelvic region: Secondary | ICD-10-CM | POA: Diagnosis not present

## 2022-12-29 DIAGNOSIS — M9904 Segmental and somatic dysfunction of sacral region: Secondary | ICD-10-CM | POA: Diagnosis not present

## 2022-12-29 DIAGNOSIS — M9903 Segmental and somatic dysfunction of lumbar region: Secondary | ICD-10-CM | POA: Diagnosis not present

## 2023-01-01 ENCOUNTER — Telehealth: Payer: Self-pay

## 2023-01-01 ENCOUNTER — Encounter: Payer: Self-pay | Admitting: Physician Assistant

## 2023-01-01 DIAGNOSIS — M9904 Segmental and somatic dysfunction of sacral region: Secondary | ICD-10-CM | POA: Diagnosis not present

## 2023-01-01 DIAGNOSIS — M9905 Segmental and somatic dysfunction of pelvic region: Secondary | ICD-10-CM | POA: Diagnosis not present

## 2023-01-01 DIAGNOSIS — M9903 Segmental and somatic dysfunction of lumbar region: Secondary | ICD-10-CM | POA: Diagnosis not present

## 2023-01-01 DIAGNOSIS — M25551 Pain in right hip: Secondary | ICD-10-CM | POA: Diagnosis not present

## 2023-01-01 NOTE — Telephone Encounter (Signed)
Attempted call to patient to find out date of PAP so we can complete scanning of results.  Left a voice mail message requesting a return call.

## 2023-01-02 DIAGNOSIS — F4323 Adjustment disorder with mixed anxiety and depressed mood: Secondary | ICD-10-CM | POA: Diagnosis not present

## 2023-01-02 NOTE — Telephone Encounter (Signed)
Spoke with patient . PAP date received and scanned into chart.

## 2023-01-08 DIAGNOSIS — M9903 Segmental and somatic dysfunction of lumbar region: Secondary | ICD-10-CM | POA: Diagnosis not present

## 2023-01-08 DIAGNOSIS — M25551 Pain in right hip: Secondary | ICD-10-CM | POA: Diagnosis not present

## 2023-01-08 DIAGNOSIS — M9904 Segmental and somatic dysfunction of sacral region: Secondary | ICD-10-CM | POA: Diagnosis not present

## 2023-01-08 DIAGNOSIS — M9905 Segmental and somatic dysfunction of pelvic region: Secondary | ICD-10-CM | POA: Diagnosis not present

## 2023-01-09 DIAGNOSIS — F4323 Adjustment disorder with mixed anxiety and depressed mood: Secondary | ICD-10-CM | POA: Diagnosis not present

## 2023-01-12 DIAGNOSIS — M25551 Pain in right hip: Secondary | ICD-10-CM | POA: Diagnosis not present

## 2023-01-12 DIAGNOSIS — M9905 Segmental and somatic dysfunction of pelvic region: Secondary | ICD-10-CM | POA: Diagnosis not present

## 2023-01-12 DIAGNOSIS — M9903 Segmental and somatic dysfunction of lumbar region: Secondary | ICD-10-CM | POA: Diagnosis not present

## 2023-01-12 DIAGNOSIS — M9904 Segmental and somatic dysfunction of sacral region: Secondary | ICD-10-CM | POA: Diagnosis not present

## 2023-01-16 DIAGNOSIS — F4323 Adjustment disorder with mixed anxiety and depressed mood: Secondary | ICD-10-CM | POA: Diagnosis not present

## 2023-01-18 DIAGNOSIS — M9905 Segmental and somatic dysfunction of pelvic region: Secondary | ICD-10-CM | POA: Diagnosis not present

## 2023-01-18 DIAGNOSIS — M9904 Segmental and somatic dysfunction of sacral region: Secondary | ICD-10-CM | POA: Diagnosis not present

## 2023-01-18 DIAGNOSIS — M9903 Segmental and somatic dysfunction of lumbar region: Secondary | ICD-10-CM | POA: Diagnosis not present

## 2023-01-18 DIAGNOSIS — M25551 Pain in right hip: Secondary | ICD-10-CM | POA: Diagnosis not present

## 2023-01-23 DIAGNOSIS — F4323 Adjustment disorder with mixed anxiety and depressed mood: Secondary | ICD-10-CM | POA: Diagnosis not present

## 2023-01-24 DIAGNOSIS — I899 Noninfective disorder of lymphatic vessels and lymph nodes, unspecified: Secondary | ICD-10-CM | POA: Diagnosis not present

## 2023-01-24 DIAGNOSIS — R590 Localized enlarged lymph nodes: Secondary | ICD-10-CM | POA: Diagnosis not present

## 2023-01-24 DIAGNOSIS — R599 Enlarged lymph nodes, unspecified: Secondary | ICD-10-CM | POA: Diagnosis not present

## 2023-01-26 DIAGNOSIS — M25551 Pain in right hip: Secondary | ICD-10-CM | POA: Diagnosis not present

## 2023-01-26 DIAGNOSIS — M9904 Segmental and somatic dysfunction of sacral region: Secondary | ICD-10-CM | POA: Diagnosis not present

## 2023-01-26 DIAGNOSIS — M9905 Segmental and somatic dysfunction of pelvic region: Secondary | ICD-10-CM | POA: Diagnosis not present

## 2023-01-26 DIAGNOSIS — M9903 Segmental and somatic dysfunction of lumbar region: Secondary | ICD-10-CM | POA: Diagnosis not present

## 2023-01-30 DIAGNOSIS — M9903 Segmental and somatic dysfunction of lumbar region: Secondary | ICD-10-CM | POA: Diagnosis not present

## 2023-01-30 DIAGNOSIS — M9905 Segmental and somatic dysfunction of pelvic region: Secondary | ICD-10-CM | POA: Diagnosis not present

## 2023-01-30 DIAGNOSIS — M25551 Pain in right hip: Secondary | ICD-10-CM | POA: Diagnosis not present

## 2023-01-30 DIAGNOSIS — M9904 Segmental and somatic dysfunction of sacral region: Secondary | ICD-10-CM | POA: Diagnosis not present

## 2023-02-09 DIAGNOSIS — M9905 Segmental and somatic dysfunction of pelvic region: Secondary | ICD-10-CM | POA: Diagnosis not present

## 2023-02-09 DIAGNOSIS — M25551 Pain in right hip: Secondary | ICD-10-CM | POA: Diagnosis not present

## 2023-02-09 DIAGNOSIS — M9903 Segmental and somatic dysfunction of lumbar region: Secondary | ICD-10-CM | POA: Diagnosis not present

## 2023-02-09 DIAGNOSIS — M9904 Segmental and somatic dysfunction of sacral region: Secondary | ICD-10-CM | POA: Diagnosis not present

## 2023-02-12 DIAGNOSIS — M9905 Segmental and somatic dysfunction of pelvic region: Secondary | ICD-10-CM | POA: Diagnosis not present

## 2023-02-12 DIAGNOSIS — M9902 Segmental and somatic dysfunction of thoracic region: Secondary | ICD-10-CM | POA: Diagnosis not present

## 2023-02-12 DIAGNOSIS — M25551 Pain in right hip: Secondary | ICD-10-CM | POA: Diagnosis not present

## 2023-02-12 DIAGNOSIS — M9904 Segmental and somatic dysfunction of sacral region: Secondary | ICD-10-CM | POA: Diagnosis not present

## 2023-02-12 DIAGNOSIS — M9903 Segmental and somatic dysfunction of lumbar region: Secondary | ICD-10-CM | POA: Diagnosis not present

## 2023-02-16 DIAGNOSIS — M9904 Segmental and somatic dysfunction of sacral region: Secondary | ICD-10-CM | POA: Diagnosis not present

## 2023-02-16 DIAGNOSIS — M9905 Segmental and somatic dysfunction of pelvic region: Secondary | ICD-10-CM | POA: Diagnosis not present

## 2023-02-16 DIAGNOSIS — M9903 Segmental and somatic dysfunction of lumbar region: Secondary | ICD-10-CM | POA: Diagnosis not present

## 2023-02-16 DIAGNOSIS — M25551 Pain in right hip: Secondary | ICD-10-CM | POA: Diagnosis not present

## 2023-02-22 DIAGNOSIS — F4323 Adjustment disorder with mixed anxiety and depressed mood: Secondary | ICD-10-CM | POA: Diagnosis not present

## 2023-02-23 DIAGNOSIS — M25551 Pain in right hip: Secondary | ICD-10-CM | POA: Diagnosis not present

## 2023-02-23 DIAGNOSIS — M9905 Segmental and somatic dysfunction of pelvic region: Secondary | ICD-10-CM | POA: Diagnosis not present

## 2023-02-23 DIAGNOSIS — M9903 Segmental and somatic dysfunction of lumbar region: Secondary | ICD-10-CM | POA: Diagnosis not present

## 2023-02-23 DIAGNOSIS — M9904 Segmental and somatic dysfunction of sacral region: Secondary | ICD-10-CM | POA: Diagnosis not present

## 2023-03-02 DIAGNOSIS — M9903 Segmental and somatic dysfunction of lumbar region: Secondary | ICD-10-CM | POA: Diagnosis not present

## 2023-03-02 DIAGNOSIS — M25551 Pain in right hip: Secondary | ICD-10-CM | POA: Diagnosis not present

## 2023-03-02 DIAGNOSIS — M9905 Segmental and somatic dysfunction of pelvic region: Secondary | ICD-10-CM | POA: Diagnosis not present

## 2023-03-02 DIAGNOSIS — M9904 Segmental and somatic dysfunction of sacral region: Secondary | ICD-10-CM | POA: Diagnosis not present

## 2023-03-06 ENCOUNTER — Encounter: Payer: Self-pay | Admitting: Physician Assistant

## 2023-03-06 ENCOUNTER — Ambulatory Visit (INDEPENDENT_AMBULATORY_CARE_PROVIDER_SITE_OTHER): Payer: BC Managed Care – PPO | Admitting: Physician Assistant

## 2023-03-06 VITALS — BP 127/60 | HR 90 | Ht 67.0 in | Wt 210.0 lb

## 2023-03-06 DIAGNOSIS — F17299 Nicotine dependence, other tobacco product, with unspecified nicotine-induced disorders: Secondary | ICD-10-CM

## 2023-03-06 DIAGNOSIS — K032 Erosion of teeth: Secondary | ICD-10-CM

## 2023-03-06 DIAGNOSIS — M79671 Pain in right foot: Secondary | ICD-10-CM | POA: Diagnosis not present

## 2023-03-06 DIAGNOSIS — Z79899 Other long term (current) drug therapy: Secondary | ICD-10-CM | POA: Diagnosis not present

## 2023-03-06 DIAGNOSIS — E538 Deficiency of other specified B group vitamins: Secondary | ICD-10-CM

## 2023-03-06 DIAGNOSIS — F411 Generalized anxiety disorder: Secondary | ICD-10-CM

## 2023-03-06 MED ORDER — CYANOCOBALAMIN 1000 MCG/ML IJ SOLN: INTRAMUSCULAR | 3 refills | Status: DC

## 2023-03-06 MED ORDER — SERTRALINE HCL 25 MG PO TABS: 25.0000 mg | ORAL_TABLET | Freq: Every day | ORAL | 3 refills | Status: DC

## 2023-03-06 NOTE — Progress Notes (Signed)
Established Patient Office Visit  Subjective   Patient ID: Joanna Baker, female    DOB: 1970-09-29  Age: 52 y.o. MRN: [id number]  Chief Complaint  Patient presents with   Dental Pain    Dental pain from nicotine lozenges     HPI Pt is a 52 yo female who presents to the clinic to have FMLA paperwork filled out.   She has some dental erosion and needs some teeth extracted. She may need other procedures as well. She will need to be able to take some days here and there. She was falling asleep with nicotine lozengers in mouth and night and holding them against her gum and tooth. Her upper right incisor has started to erode and needs to be removed. She is not smoking or vaping. She does have some sinus pressure. No fever, chills.   Pt has a right foot that is painful and swells for time to time. She wanted her uric acid checked for gout. Not swollen or painful now.   Doing well with mood on zoloft. No concerns. No SI/HC.   Needs refills for b12.    .. Active Ambulatory Problems    Diagnosis Date Noted   Hemorrhoid 08/03/2011   Right lumbar radiculitis 01/13/2013   Fatty liver disease, nonalcoholic 11/02/2013   Depression 12/11/2013   GAD (generalized anxiety disorder) 12/11/2013   Premenstrual symptom 11/07/2014   ACL tear 07/19/2015   Mass of mandible 09/02/2015   Right upper quadrant abdominal pain 09/02/2015   Right-sided chest pain 05/18/2016   Radicular syndrome of right leg 05/18/2016   Pulsatile tinnitus of left ear 05/18/2016   DUB (dysfunctional uterine bleeding) 05/21/2016   Pernicious anemia 05/22/2016   Vitamin D insufficiency 05/22/2016   SOB (shortness of breath) 12/24/2016   Palpitations 12/24/2016   Vitamin B12 deficiency 12/24/2016   Right ankle swelling 12/24/2016   Family history of CHF (congestive heart failure) 12/24/2016   Breast calcification, left 07/26/2017   Atypical ductal hyperplasia of left breast 08/05/2017   PVC's (premature ventricular  contractions) 10/27/2017   Right peroneal tendinosis 02/26/2018   Hand eczema 05/16/2018   Pain of right thumb 05/16/2018   Pain of right heel 05/16/2018   Multiple joint pain 05/16/2018   Lymphadenopathy 05/16/2018   BPPV (benign paroxysmal positional vertigo), left 12/12/2018   Bone pain 12/12/2018   Chronic right shoulder pain 01/06/2020   Cough 01/06/2020   Environmental allergies 01/06/2020   Seasonal allergies 01/06/2020   Wheezing 11/21/2020   Radiculitis of right cervical region 11/30/2020   Atypical chest pain 01/23/2021   Trouble in sleeping 01/29/2021   Hypotension 06/19/2021   Atrial septal defect    Medial epicondylitis, left 01/11/2022   Arthralgia 05/02/2022   Fatty liver, alcoholic 10/03/2022   Arthritis of finger 10/03/2022   Dental erosion 03/06/2023   Right foot pain 03/06/2023   Resolved Ambulatory Problems    Diagnosis Date Noted   Right leg pain 02/09/2015   Left knee injury 07/08/2015   Past Medical History:  Diagnosis Date   ACL injury tear    Anxiety    Asthma    Fatty liver    IBS (irritable bowel syndrome)    TB (tuberculosis), treated       ROS See HPI.    Objective:     BP 127/60   Pulse 90   Ht 5\' 7"  (1.702 m)   Wt 210 lb (95.3 kg)   SpO2 99%   BMI 32.89 kg/m  BP  Readings from Last 3 Encounters:  03/06/23 127/60  12/11/22 (!) 105/49  10/11/22 113/61       Physical Exam Constitutional:      Appearance: Normal appearance. She is obese.  HENT:     Head: Normocephalic.     Nose: Nose normal. No congestion.     Mouth/Throat:     Mouth: Mucous membranes are moist.     Comments: Upper incisor with dental erosion.  Cardiovascular:     Rate and Rhythm: Normal rate and regular rhythm.  Neurological:     General: No focal deficit present.     Mental Status: She is alert and oriented to person, place, and time.  Psychiatric:        Mood and Affect: Mood normal.      The 10-year ASCVD risk score (Arnett DK, et al.,  2019) is: 0.8%    Assessment & Plan:  Joanna KitchenMarland KitchenThaila "Drea" was seen today for dental pain.  Diagnoses and all orders for this visit:  Dental erosion  Vitamin B12 deficiency -     cyanocobalamin (VITAMIN B12) 1000 MCG/ML injection; ADMINISTER 1 ML(1000 MCG) IN THE MUSCLE EVERY 30 DAYS -     B12 and Folate Panel  Right foot pain -     Uric acid  Medication management -     Uric acid -     CMP14+EGFR -     TSH -     B12 and Folate Panel -     VITAMIN D 25 Hydroxy (Vit-D Deficiency, Fractures)  GAD (generalized anxiety disorder) -     sertraline (ZOLOFT) 25 MG tablet; Take 1 tablet (25 mg total) by mouth daily.  Other tobacco product nicotine dependence with nicotine-induced disorder   Pt has procedure for dental extraction scheduled and needs FMLA filled out. She may have to have future procedures as well.  Ok'd 4 days a month 1-2 days at a time for procedures and follow up due to dental erosions.   Mood and anxiety controlled Refilled zoloft  Will get uric acid to evaluate for any signs of gout.   B12 refilled-recheck in 3-6 months  Discussed cutting back on nicotine to see if she can get off of these completely.    Tandy Gaw, PA-C

## 2023-03-07 ENCOUNTER — Encounter: Payer: Self-pay | Admitting: Physician Assistant

## 2023-03-07 LAB — CMP14+EGFR
ALT: 25 IU/L (ref 0–32)
AST: 26 IU/L (ref 0–40)
Albumin: 4.5 g/dL (ref 3.9–4.9)
Alkaline Phosphatase: 65 IU/L (ref 44–121)
BUN/Creatinine Ratio: 16 (ref 9–23)
BUN: 13 mg/dL (ref 6–24)
Bilirubin Total: 0.3 mg/dL (ref 0.0–1.2)
CO2: 26 mmol/L (ref 20–29)
Calcium: 9.9 mg/dL (ref 8.7–10.2)
Chloride: 101 mmol/L (ref 96–106)
Creatinine, Ser: 0.81 mg/dL (ref 0.57–1.00)
Globulin, Total: 2.4 g/dL (ref 1.5–4.5)
Glucose: 118 mg/dL — ABNORMAL HIGH (ref 70–99)
Potassium: 3.9 mmol/L (ref 3.5–5.2)
Sodium: 143 mmol/L (ref 134–144)
Total Protein: 6.9 g/dL (ref 6.0–8.5)
eGFR: 88 mL/min/{1.73_m2} (ref >59)

## 2023-03-07 LAB — B12 AND FOLATE PANEL
Folate: >20 ng/mL (ref >3.0)
Vitamin B-12: 932 pg/mL (ref 232–1245)

## 2023-03-07 LAB — URIC ACID: Uric Acid: 5.5 mg/dL (ref 2.6–6.2)

## 2023-03-07 LAB — TSH: TSH: 1.39 u[IU]/mL (ref 0.450–4.500)

## 2023-03-07 LAB — VITAMIN D 25 HYDROXY (VIT D DEFICIENCY, FRACTURES): Vit D, 25-Hydroxy: 51.8 ng/mL (ref 30.0–100.0)

## 2023-03-07 NOTE — Progress Notes (Signed)
Drea,   Normal uric acid. No signs of gout.  Thyroid looks great.  B12 and folate look amazing.  Vitamin D looks great.  Kidney and liver look good.  Glucose is fine if not fasting.   GREAT labs.

## 2023-03-08 DIAGNOSIS — F4323 Adjustment disorder with mixed anxiety and depressed mood: Secondary | ICD-10-CM | POA: Diagnosis not present

## 2023-03-16 DIAGNOSIS — M25551 Pain in right hip: Secondary | ICD-10-CM | POA: Diagnosis not present

## 2023-03-16 DIAGNOSIS — M9905 Segmental and somatic dysfunction of pelvic region: Secondary | ICD-10-CM | POA: Diagnosis not present

## 2023-03-16 DIAGNOSIS — M9904 Segmental and somatic dysfunction of sacral region: Secondary | ICD-10-CM | POA: Diagnosis not present

## 2023-03-16 DIAGNOSIS — M9903 Segmental and somatic dysfunction of lumbar region: Secondary | ICD-10-CM | POA: Diagnosis not present

## 2023-03-26 DIAGNOSIS — M9905 Segmental and somatic dysfunction of pelvic region: Secondary | ICD-10-CM | POA: Diagnosis not present

## 2023-03-26 DIAGNOSIS — M25551 Pain in right hip: Secondary | ICD-10-CM | POA: Diagnosis not present

## 2023-03-26 DIAGNOSIS — M9904 Segmental and somatic dysfunction of sacral region: Secondary | ICD-10-CM | POA: Diagnosis not present

## 2023-03-26 DIAGNOSIS — M9903 Segmental and somatic dysfunction of lumbar region: Secondary | ICD-10-CM | POA: Diagnosis not present

## 2023-04-01 NOTE — Progress Notes (Signed)
HPI: FU ASD.  Monitor June 2018 showed sinus rhythm with occasional PAC and PVC.  Exercise treadmill August 2022 showed no ST changes but she failed to reach target heart rate (75%).  Cardiac CTA November 2022 showed calcium score 0 with no evidence of coronary disease.  There was note of a secundum atrial septal defect with normal pulmonary vein drainage into the left atrium.  There was moderate right ventricular enlargement and mild right atrial enlargement.  Cardiac MRI December 2022 showed normal LV size with ejection fraction 51%, normal RV size with normal function, borderline right atrial dilatation and ASD with QP/QS 1.08.  TEE January 2023 showed normal LV function, small secundum ASD with left-to-right shunting (shunt fraction 1.07:1).  Patient seen by Dr. Excell Seltzer and medical therapy with follow-up echocardiograms recommended (ASD is small with low QP/QS).  Snce last seen the patient has dyspnea with more extreme activities but not with routine activities. It is relieved with rest. It is not associated with chest pain. There is no orthopnea, PND or pedal edema. There is no syncope or palpitations. There is no exertional chest pain.   Current Outpatient Medications  Medication Sig Dispense Refill   CALCIUM-MAGNESIUM-VITAMIN D PO Take 1 capsule by mouth in the morning and at bedtime. Calcium 333 mg, Vit D 5 mcg, Mag 133 mg, Zinc 5 mg     cyanocobalamin (VITAMIN B12) 1000 MCG/ML injection ADMINISTER 1 ML(1000 MCG) IN THE MUSCLE EVERY 30 DAYS 3 mL 3   cyclobenzaprine (FLEXERIL) 10 MG tablet 1/2-1 tab p.o. nightly as needed. 30 tablet 1   diclofenac Sodium (VOLTAREN) 1 % GEL Apply 4 g topically 4 (four) times daily. To affected joint. 100 g 1   EPINEPHrine 0.3 mg/0.3 mL IJ SOAJ injection Inject 0.3 mg into the muscle as needed for anaphylaxis. 1 each 0   ibuprofen (ADVIL) 800 MG tablet Take 800 mg by mouth every 8 (eight) hours as needed (pain.).     ipratropium (ATROVENT) 0.06 % nasal spray  PLACE 2 SPRAYS INTO BOTH NOSTRILS 4 TIMES DAILY. 30 mL 5   LORazepam (ATIVAN) 0.5 MG tablet Take 1 tablet (0.5 mg total) by mouth every 8 (eight) hours as needed. for anxiety 30 tablet 1   naproxen sodium (ANAPROX) 220 MG tablet Take 220 mg by mouth daily as needed (Pain).     NEEDLE, DISP, 25 G (B-D DISP NEEDLE 25GX1") 25G X 1" MISC Use with monthly B12 injections. 10 each 1   nitrofurantoin, macrocrystal-monohydrate, (MACROBID) 100 MG capsule Take one cap PO Q12hr with food. 14 capsule 0   sertraline (ZOLOFT) 25 MG tablet Take 1 tablet (25 mg total) by mouth daily. 90 tablet 3   No current facility-administered medications for this visit.     Past Medical History:  Diagnosis Date   ACL injury tear    Right knee, not repaired.     Anxiety    Asthma    Depression    Fatty liver    IBS (irritable bowel syndrome)    TB (tuberculosis), treated     6 month of INH at age 8    Past Surgical History:  Procedure Laterality Date   COLONOSCOPY     excision cells breast Left    TEE WITHOUT CARDIOVERSION N/A 08/15/2021   Procedure: TRANSESOPHAGEAL ECHOCARDIOGRAM (TEE);  Surgeon: Thurmon Fair, MD;  Location: [facility] ENDOSCOPY;  Service: Cardiovascular;  Laterality: N/A;   TONSILLECTOMY  07/17/1991    Social History   Socioeconomic History  Marital status: Single    Spouse name: Not on file   Number of children: Not on file   Years of education: Not on file   Highest education level: Bachelor's degree (e.g., BA, AB, BS)  Occupational History   Not on file  Tobacco Use   Smoking status: Former    Current packs/day: 0.25    Average packs/day: 0.3 packs/day for 5.0 years (1.3 ttl pk-yrs)    Types: Cigarettes   Smokeless tobacco: Never  Vaping Use   Vaping status: Never Used  Substance and Sexual Activity   Alcohol use: Yes    Alcohol/week: 2.0 standard drinks of alcohol    Types: 2 Cans of beer per week    Comment: Occasional   Drug use: Not Currently    Types: Marijuana     Comment: history of use, not current use   Sexual activity: Not on file  Other Topics Concern   Not on file  Social History Narrative   Not on file   Social Determinants of Health   Financial Resource Strain: Low Risk  (03/02/2023)   Overall Financial Resource Strain (CARDIA)    Difficulty of Paying Living Expenses: Not hard at all  Food Insecurity: No Food Insecurity (03/02/2023)   Hunger Vital Sign    Worried About Running Out of Food in the Last Year: Never true    Ran Out of Food in the Last Year: Never true  Transportation Needs: No Transportation Needs (03/02/2023)   PRAPARE - Administrator, Civil Service (Medical): No    Lack of Transportation (Non-Medical): No  Physical Activity: Insufficiently Active (03/02/2023)   Exercise Vital Sign    Days of Exercise per Week: 1 day    Minutes of Exercise per Session: 20 min  Stress: No Stress Concern Present (03/02/2023)   [employer] of Occupational Health - Occupational Stress Questionnaire    Feeling of Stress : Not at all  Social Connections: Unknown (03/02/2023)   Social Connection and Isolation Panel [NHANES]    Frequency of Communication with Friends and Family: Patient declined    Frequency of Social Gatherings with Friends and Family: Patient declined    Attends Religious Services: Never    Database administrator or Organizations: No    Attends Engineer, structural: Not on file    Marital Status: Patient declined  Intimate Partner Violence: Unknown (10/17/2021)   Received from [employer], [facility]   HITS    Physically Hurt: Not on file    Insult or Talk Down To: Not on file    Threaten Physical Harm: Not on file    Scream or Curse: Not on file    Family History  Problem Relation Age of Onset   Pulmonary embolism Mother    Stroke Mother 28   Alcohol abuse Father    Suicidality Father    Heart failure Father    Alcoholism Father    Diabetes Father    Heart attack Maternal Grandmother     Depression Paternal Grandfather    Breast cancer Neg Hx     ROS: no fevers or chills, productive cough, hemoptysis, dysphasia, odynophagia, melena, hematochezia, dysuria, hematuria, rash, seizure activity, orthopnea, PND, pedal edema, claudication. Remaining systems are negative.  Physical Exam: Well-developed well-nourished in no acute distress.  Skin is warm and dry.  HEENT is normal.  Neck is supple.  Chest is clear to auscultation with normal expansion.  Cardiovascular exam is regular rate and rhythm.  Abdominal exam nontender or distended. No masses palpated. Extremities show no edema. neuro grossly intact  EKG Interpretation Date/Time:  Monday April 15 2023 16:14:22 EDT Ventricular Rate:  79 PR Interval:  146 QRS Duration:  92 QT Interval:  388 QTC Calculation: 444 R Axis:   45  Text Interpretation: Normal sinus rhythm Cannot rule out Anterior infarct , age undetermined Confirmed by Olga Millers ([id number]) on 04/15/2023 4:16:03 PM    A/P  1 atrial septal defect-small on previous MRI and transesophageal echocardiogram.  Previously seen by Dr. Excell Seltzer and medical therapy felt to be indicated.  Will plan to repeat echocardiogram.  2 history of chest pain-no recurrent symptoms.  Previous cardiac CTA showed no coronary disease.  3 history of anxiety-managed by primary care.  Olga Millers, MD

## 2023-04-05 DIAGNOSIS — F4323 Adjustment disorder with mixed anxiety and depressed mood: Secondary | ICD-10-CM | POA: Diagnosis not present

## 2023-04-12 ENCOUNTER — Ambulatory Visit
Admission: RE | Admit: 2023-04-12 | Discharge: 2023-04-12 | Disposition: A | Discharge status: Home / Self Care | Payer: BC Managed Care – PPO | Source: Ambulatory Visit | Attending: Family Medicine | Admitting: Family Medicine

## 2023-04-12 VITALS — BP 118/77 | HR 78 | Temp 98.4°F | Resp 16

## 2023-04-12 DIAGNOSIS — R350 Frequency of micturition: Secondary | ICD-10-CM | POA: Diagnosis not present

## 2023-04-12 DIAGNOSIS — R300 Dysuria: Secondary | ICD-10-CM | POA: Diagnosis not present

## 2023-04-12 DIAGNOSIS — K5900 Constipation, unspecified: Secondary | ICD-10-CM | POA: Diagnosis not present

## 2023-04-12 DIAGNOSIS — Z975 Presence of (intrauterine) contraceptive device: Secondary | ICD-10-CM | POA: Diagnosis not present

## 2023-04-12 DIAGNOSIS — R1906 Epigastric swelling, mass or lump: Secondary | ICD-10-CM | POA: Diagnosis not present

## 2023-04-12 LAB — POCT URINALYSIS DIP (MANUAL ENTRY)
Bilirubin, UA: NEGATIVE
Blood, UA: NEGATIVE
Glucose, UA: NEGATIVE mg/dL
Ketones, POC UA: NEGATIVE mg/dL
Leukocytes, UA: NEGATIVE
Nitrite, UA: NEGATIVE
Protein Ur, POC: NEGATIVE mg/dL
Spec Grav, UA: 1.015 (ref 1.010–1.025)
Urobilinogen, UA: 0.2 U/dL
pH, UA: 7 (ref 5.0–8.0)

## 2023-04-12 MED ORDER — NITROFURANTOIN MONOHYD MACRO 100 MG PO CAPS: ORAL_CAPSULE | ORAL | 0 refills | Status: DC

## 2023-04-12 NOTE — ED Triage Notes (Signed)
Pt c/o urinary frequency x 4 days. Tested pos for leukocytes on at home test this am. AZO this am.

## 2023-04-12 NOTE — ED Provider Notes (Signed)
[facility]    CSN: [id number] Arrival date & time: 04/12/23  7829      History   Chief Complaint Chief Complaint  Patient presents with   Urinary Frequency    HPI Joanna Baker is a 52 y.o. female.   HPI  Past Medical History:  Diagnosis Date   ACL injury tear    Right knee, not repaired.     Anxiety    Asthma    Depression    Fatty liver    IBS (irritable bowel syndrome)    TB (tuberculosis), treated     6 month of INH at age 78    Patient Active Problem List   Diagnosis Date Noted   Dental erosion 03/06/2023   Right foot pain 03/06/2023   Fatty liver, alcoholic 10/03/2022   Arthritis of finger 10/03/2022   Arthralgia 05/02/2022   Medial epicondylitis, left 01/11/2022   Atrial septal defect    Hypotension 06/19/2021   Trouble in sleeping 01/29/2021   Atypical chest pain 01/23/2021   Radiculitis of right cervical region 11/30/2020   Wheezing 11/21/2020   Chronic right shoulder pain 01/06/2020   Cough 01/06/2020   Environmental allergies 01/06/2020   Seasonal allergies 01/06/2020   BPPV (benign paroxysmal positional vertigo), left 12/12/2018   Bone pain 12/12/2018   Hand eczema 05/16/2018   Pain of right thumb 05/16/2018   Pain of right heel 05/16/2018   Multiple joint pain 05/16/2018   Lymphadenopathy 05/16/2018   Right peroneal tendinosis 02/26/2018   PVC's (premature ventricular contractions) 10/27/2017   Atypical ductal hyperplasia of left breast 08/05/2017   Breast calcification, left 07/26/2017   SOB (shortness of breath) 12/24/2016   Palpitations 12/24/2016   Vitamin B12 deficiency 12/24/2016   Right ankle swelling 12/24/2016   Family history of CHF (congestive heart failure) 12/24/2016   Pernicious anemia 05/22/2016   Vitamin D insufficiency 05/22/2016   DUB (dysfunctional uterine bleeding) 05/21/2016   Right-sided chest pain 05/18/2016   Radicular syndrome of right leg 05/18/2016   Pulsatile tinnitus of left ear 05/18/2016    Mass of mandible 09/02/2015   Right upper quadrant abdominal pain 09/02/2015   ACL tear 07/19/2015   Premenstrual symptom 11/07/2014   Depression 12/11/2013   GAD (generalized anxiety disorder) 12/11/2013   Fatty liver disease, nonalcoholic 11/02/2013   Right lumbar radiculitis 01/13/2013   Hemorrhoid 08/03/2011    Past Surgical History:  Procedure Laterality Date   COLONOSCOPY     excision cells breast Left    TEE WITHOUT CARDIOVERSION N/A 08/15/2021   Procedure: TRANSESOPHAGEAL ECHOCARDIOGRAM (TEE);  Surgeon: Thurmon Fair, MD;  Location: [facility] ENDOSCOPY;  Service: Cardiovascular;  Laterality: N/A;   TONSILLECTOMY  07/17/1991    OB History   No obstetric history on file.      Home Medications    Prior to Admission medications   Medication Sig Start Date End Date Taking? Authorizing Provider  nitrofurantoin, macrocrystal-monohydrate, (MACROBID) 100 MG capsule Take one cap PO Q12hr with food. 04/12/23  Yes Lattie Haw, MD  CALCIUM-MAGNESIUM-VITAMIN D PO Take 1 capsule by mouth in the morning and at bedtime. Calcium 333 mg, Vit D 5 mcg, Mag 133 mg, Zinc 5 mg    [provider]  cyanocobalamin (VITAMIN B12) 1000 MCG/ML injection ADMINISTER 1 ML(1000 MCG) IN THE MUSCLE EVERY 30 DAYS 03/06/23   Breeback, Jade L, PA-C  cyclobenzaprine (FLEXERIL) 10 MG tablet 1/2-1 tab p.o. nightly as needed. 08/08/22   Breeback, Lesly Rubenstein L, PA-C  diclofenac Sodium (  VOLTAREN) 1 % GEL Apply 4 g topically 4 (four) times daily. To affected joint. 05/02/22   Breeback, Jade L, PA-C  EPINEPHrine 0.3 mg/0.3 mL IJ SOAJ injection Inject 0.3 mg into the muscle as needed for anaphylaxis. 10/11/22   Agapito Games, MD  ibuprofen (ADVIL) 800 MG tablet Take 800 mg by mouth every 8 (eight) hours as needed (pain.). 07/14/21   [provider]  ipratropium (ATROVENT) 0.06 % nasal spray PLACE 2 SPRAYS INTO BOTH NOSTRILS 4 TIMES DAILY. 12/20/21   Breeback, Jade L, PA-C  LORazepam (ATIVAN) 0.5 MG  tablet Take 1 tablet (0.5 mg total) by mouth every 8 (eight) hours as needed. for anxiety 04/13/21   Tandy Gaw L, PA-C  naproxen sodium (ANAPROX) 220 MG tablet Take 220 mg by mouth daily as needed (Pain).    [provider]  NEEDLE, DISP, 25 G (B-D DISP NEEDLE 25GX1") 25G X 1" MISC Use with monthly B12 injections. 05/02/22   Breeback, Jade L, PA-C  sertraline (ZOLOFT) 25 MG tablet Take 1 tablet (25 mg total) by mouth daily. 03/06/23   Jomarie Longs, PA-C    Family History Family History  Problem Relation Age of Onset   Pulmonary embolism Mother    Stroke Mother 80   Alcohol abuse Father    Suicidality Father    Heart failure Father    Alcoholism Father    Diabetes Father    Heart attack Maternal Grandmother    Depression Paternal Grandfather    Breast cancer Neg Hx     Social History Social History   Tobacco Use   Smoking status: Former    Current packs/day: 0.25    Average packs/day: 0.3 packs/day for 5.0 years (1.3 ttl pk-yrs)    Types: Cigarettes   Smokeless tobacco: Never  Vaping Use   Vaping status: Never Used  Substance Use Topics   Alcohol use: Yes    Alcohol/week: 2.0 standard drinks of alcohol    Types: 2 Cans of beer per week    Comment: Occasional   Drug use: Not Currently    Types: Marijuana    Comment: history of use, not current use     Allergies   Influenza vaccines, Iodine, Chantix [varenicline tartrate], Lobster [shellfish allergy], Penicillins, Prednisone, Prozac [fluoxetine hcl], and Sulfa antibiotics   Review of Systems Review of Systems   Physical Exam Triage Vital Signs ED Triage Vitals  Encounter Vitals Group     BP 04/12/23 0946 118/77     Systolic BP Percentile --      Diastolic BP Percentile --      Pulse Rate 04/12/23 0946 78     Resp 04/12/23 0946 16     Temp 04/12/23 0946 98.4 F (36.9 C)     Temp Source 04/12/23 0946 Oral     SpO2 04/12/23 0946 98 %     Weight --      Height --      Head Circumference --       Peak Flow --      Pain Score 04/12/23 0947 0     Pain Loc --      Pain Education --      Exclude from Growth Chart --    No data found.  Updated Vital Signs BP 118/77 (BP Location: Right Arm)   Pulse 78   Temp 98.4 F (36.9 C) (Oral)   Resp 16   SpO2 98%   Visual Acuity Right Eye Distance:  Left Eye Distance:   Bilateral Distance:    Right Eye Near:   Left Eye Near:    Bilateral Near:     Physical Exam   UC Treatments / Results  Labs (all labs ordered are listed, but only abnormal results are displayed) Labs Reviewed  URINE CULTURE  POCT URINALYSIS DIP (MANUAL ENTRY)    EKG   Radiology No results found.  Procedures Procedures (including critical care time)  Medications Ordered in UC Medications - No data to display  Initial Impression / Assessment and Plan / UC Course  I have reviewed the triage vital signs and the nursing notes.  Pertinent labs & imaging results that were available during my care of the patient were reviewed by me and considered in my medical decision making (see chart for details).    Although patient's urinalysis is normal, will begin empiric Macrobid because her bladder symptoms are worsening. Urine culture pending. (She may stop antibiotic if culture negative).   Final Clinical Impressions(s) / UC Diagnoses   Final diagnoses:  Urinary frequency  Dysuria     Discharge Instructions      Increase fluid intake. May use non-prescription AZO for about two days, if desired, to decrease urinary discomfort.  If symptoms become significantly worse during the night or over the weekend, proceed to the local emergency room.     ED Prescriptions     Medication Sig Dispense Auth. Provider   nitrofurantoin, macrocrystal-monohydrate, (MACROBID) 100 MG capsule Take one cap PO Q12hr with food. 14 capsule Lattie Haw, MD      PDMP not reviewed this encounter.

## 2023-04-12 NOTE — Discharge Instructions (Signed)
Increase fluid intake. May use non-prescription AZO for about two days, if desired, to decrease urinary discomfort.  If symptoms become significantly worse during the night or over the weekend, proceed to the local emergency room.  

## 2023-04-13 DIAGNOSIS — M9905 Segmental and somatic dysfunction of pelvic region: Secondary | ICD-10-CM | POA: Diagnosis not present

## 2023-04-13 DIAGNOSIS — M25551 Pain in right hip: Secondary | ICD-10-CM | POA: Diagnosis not present

## 2023-04-13 DIAGNOSIS — M9903 Segmental and somatic dysfunction of lumbar region: Secondary | ICD-10-CM | POA: Diagnosis not present

## 2023-04-13 DIAGNOSIS — M9904 Segmental and somatic dysfunction of sacral region: Secondary | ICD-10-CM | POA: Diagnosis not present

## 2023-04-13 LAB — URINE CULTURE: Culture: NO GROWTH

## 2023-04-15 ENCOUNTER — Encounter: Payer: Self-pay | Admitting: Cardiology

## 2023-04-15 ENCOUNTER — Ambulatory Visit (INDEPENDENT_AMBULATORY_CARE_PROVIDER_SITE_OTHER): Payer: BC Managed Care – PPO | Admitting: Cardiology

## 2023-04-15 VITALS — BP 106/70 | HR 79 | Ht 67.0 in | Wt 213.1 lb

## 2023-04-15 DIAGNOSIS — Q211 Atrial septal defect, unspecified: Secondary | ICD-10-CM

## 2023-04-15 DIAGNOSIS — R072 Precordial pain: Secondary | ICD-10-CM | POA: Diagnosis not present

## 2023-04-15 DIAGNOSIS — Z09 Encounter for follow-up examination after completed treatment for conditions other than malignant neoplasm: Secondary | ICD-10-CM | POA: Diagnosis not present

## 2023-04-15 NOTE — Patient Instructions (Signed)
   Testing/Procedures:  Your physician has requested that you have an echocardiogram. Echocardiography is a painless test that uses sound waves to create images of your heart. It provides your doctor with information about the size and shape of your heart and how well your heart's chambers and valves are working. This procedure takes approximately one hour. There are no restrictions for this procedure. Please do NOT wear cologne, perfume, aftershave, or lotions (deodorant is allowed). Please arrive 15 minutes prior to your appointment time. [address]   Follow-Up: At [facility], you and your health needs are our priority.  As part of our continuing mission to provide you with exceptional heart care, we have created designated Provider Care Teams.  These Care Teams include your primary Cardiologist (physician) and Advanced Practice Providers (APPs -  Physician Assistants and Nurse Practitioners) who all work together to provide you with the care you need, when you need it.  We recommend signing up for the patient portal called "MyChart".  Sign up information is provided on this After Visit Summary.  MyChart is used to connect with patients for Virtual Visits (Telemedicine).  Patients are able to view lab/test results, encounter notes, upcoming appointments, etc.  Non-urgent messages can be sent to your provider as well.   To learn more about what you can do with MyChart, go to [email or web address].    Your next appointment:   12 month(s)  Provider:   Olga Millers, MD

## 2023-04-19 DIAGNOSIS — F4323 Adjustment disorder with mixed anxiety and depressed mood: Secondary | ICD-10-CM | POA: Diagnosis not present

## 2023-04-20 DIAGNOSIS — M9905 Segmental and somatic dysfunction of pelvic region: Secondary | ICD-10-CM | POA: Diagnosis not present

## 2023-04-20 DIAGNOSIS — M9904 Segmental and somatic dysfunction of sacral region: Secondary | ICD-10-CM | POA: Diagnosis not present

## 2023-04-20 DIAGNOSIS — M25551 Pain in right hip: Secondary | ICD-10-CM | POA: Diagnosis not present

## 2023-04-20 DIAGNOSIS — M9903 Segmental and somatic dysfunction of lumbar region: Secondary | ICD-10-CM | POA: Diagnosis not present

## 2023-04-29 ENCOUNTER — Ambulatory Visit (HOSPITAL_COMMUNITY): Payer: BC Managed Care – PPO | Attending: Cardiology

## 2023-04-29 DIAGNOSIS — Q211 Atrial septal defect, unspecified: Secondary | ICD-10-CM | POA: Diagnosis not present

## 2023-04-29 LAB — ECHOCARDIOGRAM COMPLETE
Area-P 1/2: 2.99 cm2
S' Lateral: 2.4 cm

## 2023-04-30 ENCOUNTER — Ambulatory Visit (INDEPENDENT_AMBULATORY_CARE_PROVIDER_SITE_OTHER): Payer: BC Managed Care – PPO | Admitting: Physician Assistant

## 2023-04-30 ENCOUNTER — Ambulatory Visit: Payer: BC Managed Care – PPO

## 2023-04-30 ENCOUNTER — Encounter: Payer: Self-pay | Admitting: Physician Assistant

## 2023-04-30 VITALS — BP 97/65 | HR 80 | Ht 67.0 in | Wt 210.0 lb

## 2023-04-30 DIAGNOSIS — Z13220 Encounter for screening for lipoid disorders: Secondary | ICD-10-CM

## 2023-04-30 DIAGNOSIS — M79674 Pain in right toe(s): Secondary | ICD-10-CM

## 2023-04-30 DIAGNOSIS — E538 Deficiency of other specified B group vitamins: Secondary | ICD-10-CM | POA: Diagnosis not present

## 2023-04-30 DIAGNOSIS — K032 Erosion of teeth: Secondary | ICD-10-CM

## 2023-04-30 DIAGNOSIS — M25511 Pain in right shoulder: Secondary | ICD-10-CM

## 2023-04-30 DIAGNOSIS — Z79899 Other long term (current) drug therapy: Secondary | ICD-10-CM

## 2023-04-30 DIAGNOSIS — Z131 Encounter for screening for diabetes mellitus: Secondary | ICD-10-CM

## 2023-04-30 DIAGNOSIS — G8929 Other chronic pain: Secondary | ICD-10-CM

## 2023-04-30 NOTE — Progress Notes (Unsigned)
Established Patient Office Visit  Subjective   Patient ID: Joanna Baker, female    DOB: 1971/01/22  Age: 52 y.o. MRN: [id number]  No chief complaint on file.   HPI Pt is a 52 yo who presents to the clinic to follow up. She decided to have the dental implants and will be done in next few weeks.   She is having some pain in between her great right toe and 2nd toe for a few weeks. No known injury. No swelling. Pain is worse when she pushes on it or walks on it.   She has had right shoulder pain for 2 years. She has tried home PT with no improvement. No injury.   .. Active Ambulatory Problems    Diagnosis Date Noted   Hemorrhoid 08/03/2011   Right lumbar radiculitis 01/13/2013   Fatty liver disease, nonalcoholic 11/02/2013   Depression 12/11/2013   GAD (generalized anxiety disorder) 12/11/2013   Premenstrual symptom 11/07/2014   ACL tear 07/19/2015   Mass of mandible 09/02/2015   Right upper quadrant abdominal pain 09/02/2015   Right-sided chest pain 05/18/2016   Radicular syndrome of right leg 05/18/2016   Pulsatile tinnitus of left ear 05/18/2016   DUB (dysfunctional uterine bleeding) 05/21/2016   Pernicious anemia 05/22/2016   Vitamin D insufficiency 05/22/2016   SOB (shortness of breath) 12/24/2016   Palpitations 12/24/2016   Vitamin B12 deficiency 12/24/2016   Right ankle swelling 12/24/2016   Family history of CHF (congestive heart failure) 12/24/2016   Breast calcification, left 07/26/2017   Atypical ductal hyperplasia of left breast 08/05/2017   PVC's (premature ventricular contractions) 10/27/2017   Right peroneal tendinosis 02/26/2018   Hand eczema 05/16/2018   Pain of right thumb 05/16/2018   Pain of right heel 05/16/2018   Multiple joint pain 05/16/2018   Lymphadenopathy 05/16/2018   BPPV (benign paroxysmal positional vertigo), left 12/12/2018   Bone pain 12/12/2018   Chronic right shoulder pain 01/06/2020   Cough 01/06/2020   Environmental allergies  01/06/2020   Seasonal allergies 01/06/2020   Wheezing 11/21/2020   Radiculitis of right cervical region 11/30/2020   Atypical chest pain 01/23/2021   Trouble in sleeping 01/29/2021   Hypotension 06/19/2021   Atrial septal defect    Medial epicondylitis, left 01/11/2022   Arthralgia 05/02/2022   Fatty liver, alcoholic 10/03/2022   Arthritis of finger 10/03/2022   Dental erosion 03/06/2023   Right foot pain 03/06/2023   Resolved Ambulatory Problems    Diagnosis Date Noted   Right leg pain 02/09/2015   Left knee injury 07/08/2015   Past Medical History:  Diagnosis Date   ACL injury tear    Anxiety    Asthma    Fatty liver    IBS (irritable bowel syndrome)    TB (tuberculosis), treated       ROS   See HPI.  Objective:     BP 97/65   Pulse 80   Ht 5\' 7"  (1.702 m)   Wt 210 lb (95.3 kg)   SpO2 98%   BMI 32.89 kg/m  BP Readings from Last 3 Encounters:  04/30/23 97/65  04/15/23 106/70  04/12/23 118/77   Wt Readings from Last 3 Encounters:  04/30/23 210 lb (95.3 kg)  04/15/23 213 lb 1.9 oz (96.7 kg)  03/06/23 210 lb (95.3 kg)      Physical Exam Constitutional:      Appearance: Normal appearance.  HENT:     Head: Normocephalic.  Cardiovascular:     Rate  and Rhythm: Normal rate and regular rhythm.     Pulses: Normal pulses.  Pulmonary:     Effort: Pulmonary effort is normal.     Breath sounds: Normal breath sounds.  Musculoskeletal:     Cervical back: Normal range of motion.     Comments: Pain to palpation between right great toe and 2nd toe. No erythema, swelling, redness.   Pain with ROM of right shoulder Strength 5/5 Not able to actively abduct past 110 degrees  Neurological:     General: No focal deficit present.     Mental Status: She is alert and oriented to person, place, and time.  Psychiatric:        Mood and Affect: Mood normal.     The 10-year ASCVD risk score (Arnett DK, et al., 2019) is: 0.5%    Assessment & Plan:  Marland KitchenMarland KitchenDiagnoses and  all orders for this visit:  Chronic right shoulder pain -     DG Shoulder Right; Future  Dental erosion  Vitamin B12 deficiency  Medication management  Great toe pain, right -     DG Toe Great Right; Future  Screening for diabetes mellitus -     CMP14+EGFR  Screening for lipid disorders -     Lipid panel   Will xray shoulder and toe Likely toe arthritis or contusion Discussed symptomatic care  Shoulder is likely more rotator cuff injury Will get xray and consider MRI if not improving Doing home PT for last year   Tandy Gaw, [place]

## 2023-05-03 DIAGNOSIS — F4323 Adjustment disorder with mixed anxiety and depressed mood: Secondary | ICD-10-CM | POA: Diagnosis not present

## 2023-05-06 ENCOUNTER — Encounter: Payer: Self-pay | Admitting: Physician Assistant

## 2023-05-06 NOTE — Progress Notes (Signed)
No acute findings in the toe foot area.

## 2023-05-06 NOTE — Progress Notes (Signed)
No evidence of any arthritis. We need MRI to look at soft tissues better. We could try formal PT first, thoughts?

## 2023-05-07 ENCOUNTER — Encounter: Payer: Self-pay | Admitting: Physician Assistant

## 2023-05-07 DIAGNOSIS — Z131 Encounter for screening for diabetes mellitus: Secondary | ICD-10-CM | POA: Diagnosis not present

## 2023-05-07 DIAGNOSIS — Z13220 Encounter for screening for lipoid disorders: Secondary | ICD-10-CM | POA: Diagnosis not present

## 2023-05-07 NOTE — Progress Notes (Signed)
MRI is ordered.

## 2023-05-08 LAB — CMP14+EGFR
ALT: 16 [IU]/L (ref 0–32)
AST: 18 [IU]/L (ref 0–40)
Albumin: 4.4 g/dL (ref 3.9–4.9)
Alkaline Phosphatase: 66 [IU]/L (ref 44–121)
BUN/Creatinine Ratio: 13 (ref 9–23)
BUN: 11 mg/dL (ref 6–24)
Bilirubin Total: 0.5 mg/dL (ref 0.0–1.2)
CO2: 24 mmol/L (ref 20–29)
Calcium: 9.6 mg/dL (ref 8.7–10.2)
Chloride: 104 mmol/L (ref 96–106)
Creatinine, Ser: 0.82 mg/dL (ref 0.57–1.00)
Globulin, Total: 2.6 g/dL (ref 1.5–4.5)
Glucose: 98 mg/dL (ref 70–99)
Potassium: 4.2 mmol/L (ref 3.5–5.2)
Sodium: 142 mmol/L (ref 134–144)
Total Protein: 7 g/dL (ref 6.0–8.5)
eGFR: 87 mL/min/{1.73_m2} (ref >59)

## 2023-05-08 LAB — LIPID PANEL
Chol/HDL Ratio: 2.7 {ratio} (ref 0.0–4.4)
Cholesterol, Total: 208 mg/dL — ABNORMAL HIGH (ref 100–199)
HDL: 78 mg/dL (ref >39)
LDL Chol Calc (NIH): 117 mg/dL — ABNORMAL HIGH (ref 0–99)
Triglycerides: 73 mg/dL (ref 0–149)
VLDL Cholesterol Cal: 13 mg/dL (ref 5–40)

## 2023-05-08 NOTE — Progress Notes (Signed)
Drea,   Kidney, liver and glucose look great.  HDL, good cholesterol, looks wonderful.  LDL, bad cholesterol, looks ok.   Very low 10 year cardiovascular risk. Looks good.   Marland Kitchen.The 10-year ASCVD risk score (Arnett DK, et al., 2019) is: 0.5%   Values used to calculate the score:     Age: 52 years     Sex: Female     Is Non-Hispanic African American: No     Diabetic: No     Tobacco smoker: No     Systolic Blood Pressure: 97 mmHg     Is BP treated: No     HDL Cholesterol: 78 mg/dL     Total Cholesterol: 208 mg/dL

## 2023-05-10 ENCOUNTER — Encounter: Payer: Self-pay | Admitting: Physician Assistant

## 2023-05-17 DIAGNOSIS — F4323 Adjustment disorder with mixed anxiety and depressed mood: Secondary | ICD-10-CM | POA: Diagnosis not present

## 2023-05-23 ENCOUNTER — Encounter: Payer: Self-pay | Admitting: Physician Assistant

## 2023-05-23 ENCOUNTER — Telehealth: Payer: Self-pay | Admitting: Physician Assistant

## 2023-05-23 DIAGNOSIS — F411 Generalized anxiety disorder: Secondary | ICD-10-CM

## 2023-05-23 MED ORDER — LORAZEPAM 0.5 MG PO TABS: 0.5000 mg | ORAL_TABLET | Freq: Two times a day (BID) | ORAL | 0 refills | Status: DC | PRN

## 2023-05-23 NOTE — Telephone Encounter (Signed)
Patient called requesting a refill of ; LORazepam (ATIVAN) 0.5 MG tablet [[id number]]   Pharmacy ;   [facility] [phone] - [place], [place] - [address]

## 2023-05-26 ENCOUNTER — Other Ambulatory Visit: Payer: BC Managed Care – PPO

## 2023-05-26 ENCOUNTER — Encounter: Payer: Self-pay | Admitting: Physician Assistant

## 2023-05-26 DIAGNOSIS — M25511 Pain in right shoulder: Secondary | ICD-10-CM

## 2023-05-26 DIAGNOSIS — G8929 Other chronic pain: Secondary | ICD-10-CM

## 2023-05-26 DIAGNOSIS — M7581 Other shoulder lesions, right shoulder: Secondary | ICD-10-CM | POA: Diagnosis not present

## 2023-05-26 DIAGNOSIS — M129 Arthropathy, unspecified: Secondary | ICD-10-CM | POA: Diagnosis not present

## 2023-05-26 DIAGNOSIS — E538 Deficiency of other specified B group vitamins: Secondary | ICD-10-CM

## 2023-05-29 MED ORDER — CYANOCOBALAMIN 1000 MCG/ML IJ SOLN: INTRAMUSCULAR | 1 refills | Status: DC

## 2023-05-31 DIAGNOSIS — F4323 Adjustment disorder with mixed anxiety and depressed mood: Secondary | ICD-10-CM | POA: Diagnosis not present

## 2023-06-10 ENCOUNTER — Encounter: Payer: Self-pay | Admitting: Physician Assistant

## 2023-06-10 DIAGNOSIS — M7581 Other shoulder lesions, right shoulder: Secondary | ICD-10-CM | POA: Insufficient documentation

## 2023-06-10 DIAGNOSIS — M7580 Other shoulder lesions, unspecified shoulder: Secondary | ICD-10-CM | POA: Insufficient documentation

## 2023-06-10 DIAGNOSIS — M7591 Shoulder lesion, unspecified, right shoulder: Secondary | ICD-10-CM | POA: Insufficient documentation

## 2023-06-10 NOTE — Progress Notes (Signed)
Joanna Baker,   You have severe rotator cuff tendinosis(inflammation). Formal Physical therapy would be the next to try. Are you ok with this?

## 2023-06-12 DIAGNOSIS — R195 Other fecal abnormalities: Secondary | ICD-10-CM | POA: Diagnosis not present

## 2023-06-12 DIAGNOSIS — R197 Diarrhea, unspecified: Secondary | ICD-10-CM | POA: Diagnosis not present

## 2023-06-12 DIAGNOSIS — R140 Abdominal distension (gaseous): Secondary | ICD-10-CM | POA: Diagnosis not present

## 2023-06-17 ENCOUNTER — Encounter: Payer: Self-pay | Admitting: Physician Assistant

## 2023-06-17 DIAGNOSIS — M7591 Shoulder lesion, unspecified, right shoulder: Secondary | ICD-10-CM

## 2023-06-17 DIAGNOSIS — M7581 Other shoulder lesions, right shoulder: Secondary | ICD-10-CM

## 2023-06-20 DIAGNOSIS — F4323 Adjustment disorder with mixed anxiety and depressed mood: Secondary | ICD-10-CM | POA: Diagnosis not present
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
## 2023-06-28 ENCOUNTER — Ambulatory Visit: Payer: BC Managed Care – PPO | Attending: Physician Assistant

## 2023-06-28 ENCOUNTER — Other Ambulatory Visit: Payer: Self-pay

## 2023-06-28 DIAGNOSIS — M6281 Muscle weakness (generalized): Secondary | ICD-10-CM | POA: Diagnosis not present

## 2023-06-28 DIAGNOSIS — G8929 Other chronic pain: Secondary | ICD-10-CM | POA: Diagnosis not present

## 2023-06-28 DIAGNOSIS — R293 Abnormal posture: Secondary | ICD-10-CM | POA: Insufficient documentation

## 2023-06-28 DIAGNOSIS — M7581 Other shoulder lesions, right shoulder: Secondary | ICD-10-CM | POA: Diagnosis not present

## 2023-06-28 DIAGNOSIS — M25511 Pain in right shoulder: Secondary | ICD-10-CM | POA: Diagnosis not present

## 2023-07-12 ENCOUNTER — Ambulatory Visit: Payer: BC Managed Care – PPO | Admitting: Rehabilitative and Restorative Service Providers"

## 2023-07-12 ENCOUNTER — Encounter: Payer: Self-pay | Admitting: Rehabilitative and Restorative Service Providers"

## 2023-07-12 DIAGNOSIS — G8929 Other chronic pain: Secondary | ICD-10-CM

## 2023-07-12 DIAGNOSIS — M6281 Muscle weakness (generalized): Secondary | ICD-10-CM | POA: Diagnosis not present

## 2023-07-12 DIAGNOSIS — M7581 Other shoulder lesions, right shoulder: Secondary | ICD-10-CM | POA: Diagnosis not present

## 2023-07-12 DIAGNOSIS — R293 Abnormal posture: Secondary | ICD-10-CM | POA: Diagnosis not present

## 2023-07-12 DIAGNOSIS — M25511 Pain in right shoulder: Secondary | ICD-10-CM | POA: Diagnosis not present
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
## 2023-07-18 DIAGNOSIS — F4323 Adjustment disorder with mixed anxiety and depressed mood: Secondary | ICD-10-CM | POA: Diagnosis not present

## 2023-07-19 ENCOUNTER — Encounter: Payer: BC Managed Care – PPO | Admitting: Physical Therapy

## 2023-07-26 ENCOUNTER — Ambulatory Visit: Payer: BC Managed Care – PPO | Admitting: Rehabilitative and Restorative Service Providers"

## 2023-08-01 DIAGNOSIS — F4323 Adjustment disorder with mixed anxiety and depressed mood: Secondary | ICD-10-CM | POA: Diagnosis not present

## 2023-09-06 ENCOUNTER — Ambulatory Visit (INDEPENDENT_AMBULATORY_CARE_PROVIDER_SITE_OTHER): Payer: BC Managed Care – PPO | Admitting: Physician Assistant

## 2023-09-06 ENCOUNTER — Encounter: Payer: Self-pay | Admitting: Physician Assistant

## 2023-09-06 VITALS — BP 94/66 | HR 84 | Ht 66.14 in | Wt 219.0 lb

## 2023-09-06 DIAGNOSIS — F411 Generalized anxiety disorder: Secondary | ICD-10-CM | POA: Diagnosis not present

## 2023-09-06 DIAGNOSIS — Z1231 Encounter for screening mammogram for malignant neoplasm of breast: Secondary | ICD-10-CM

## 2023-09-06 DIAGNOSIS — E538 Deficiency of other specified B group vitamins: Secondary | ICD-10-CM | POA: Diagnosis not present

## 2023-09-06 MED ORDER — CYANOCOBALAMIN 1000 MCG/ML IJ SOLN: INTRAMUSCULAR | 1 refills | Status: DC

## 2023-09-06 NOTE — Progress Notes (Signed)
 Established Patient Office Visit  Subjective   Patient ID: Joanna Baker, female    DOB: March 11, 1971  Age: 53 y.o. MRN: [id number]  Chief Complaint  Patient presents with   Medical Management of Chronic Issues    GAD zoloft f/u    HPI Patient is a 53 yo female who presents for f/u for generalized anxiety disorder. She is currently taking Zoloft 25mg  daily and Lorazepam 0.5mg  PRN. She is doing well. She does wonder if zoloft is causing her to gain weight. She is struggling to lose weight despite eating well and trying to be more active.   Denies suicide ideation or thoughts of self-harm.   She needs refills on B12.   .. Active Ambulatory Problems    Diagnosis Date Noted   Hemorrhoid 08/03/2011   Right lumbar radiculitis 01/13/2013   Fatty liver disease, nonalcoholic 11/02/2013   Depression 12/11/2013   GAD (generalized anxiety disorder) 12/11/2013   Premenstrual symptom 11/07/2014   ACL tear 07/19/2015   Mass of mandible 09/02/2015   Right upper quadrant abdominal pain 09/02/2015   Right-sided chest pain 05/18/2016   Radicular syndrome of right leg 05/18/2016   Pulsatile tinnitus of left ear 05/18/2016   DUB (dysfunctional uterine bleeding) 05/21/2016   Pernicious anemia 05/22/2016   Vitamin D insufficiency 05/22/2016   SOB (shortness of breath) 12/24/2016   Palpitations 12/24/2016   Vitamin B12 deficiency 12/24/2016   Right ankle swelling 12/24/2016   Family history of CHF (congestive heart failure) 12/24/2016   Breast calcification, left 07/26/2017   Atypical ductal hyperplasia of left breast 08/05/2017   PVC's (premature ventricular contractions) 10/27/2017   Right peroneal tendinosis 02/26/2018   Hand eczema 05/16/2018   Pain of right thumb 05/16/2018   Pain of right heel 05/16/2018   Multiple joint pain 05/16/2018   Lymphadenopathy 05/16/2018   BPPV (benign paroxysmal positional vertigo), left 12/12/2018   Bone pain 12/12/2018   Chronic right shoulder pain  01/06/2020   Cough 01/06/2020   Environmental allergies 01/06/2020   Seasonal allergies 01/06/2020   Wheezing 11/21/2020   Radiculitis of right cervical region 11/30/2020   Atypical chest pain 01/23/2021   Trouble in sleeping 01/29/2021   Hypotension 06/19/2021   Atrial septal defect    Medial epicondylitis, left 01/11/2022   Arthralgia 05/02/2022   Fatty liver, alcoholic 10/03/2022   Arthritis of finger 10/03/2022   Dental erosion 03/06/2023   Right foot pain 03/06/2023   Rotator cuff tendonitis 06/10/2023   Right supraspinatus tendonitis 06/10/2023   Tendinitis of right infraspinatus tendon 06/10/2023   Resolved Ambulatory Problems    Diagnosis Date Noted   Right leg pain 02/09/2015   Left knee injury 07/08/2015   Past Medical History:  Diagnosis Date   ACL injury tear    Anxiety    Asthma    Fatty liver    IBS (irritable bowel syndrome)    TB (tuberculosis), treated       Review of Systems  Psychiatric/Behavioral: Negative.  The patient is not nervous/anxious.   All other systems reviewed and are negative.    Objective:     BP 94/66 (BP Location: Left Arm, Patient Position: Sitting, Cuff Size: Large)   Pulse 84   Ht 5' 6.14" (1.68 m)   Wt 219 lb (99.3 kg)   SpO2 97%   BMI 35.20 kg/m  BP Readings from Last 3 Encounters:  09/06/23 94/66  04/30/23 97/65  04/15/23 106/70   Wt Readings from Last 3 Encounters:  09/06/23 219  lb (99.3 kg)  04/30/23 210 lb (95.3 kg)  04/15/23 213 lb 1.9 oz (96.7 kg)   ..    09/09/2023   11:47 AM 10/03/2022    9:34 AM 08/08/2022    1:33 PM 05/02/2022    9:01 AM 01/31/2022    9:17 AM  Depression screen PHQ 2/9  Decreased Interest 0 0 0 0 1  Down, Depressed, Hopeless 0 0 0 0 1  PHQ - 2 Score 0 0 0 0 2  Altered sleeping 0   0 2  Tired, decreased energy 0   1 1  Change in appetite 0   2 2  Feeling bad or failure about yourself  0   0 1  Trouble concentrating 0   0 0  Moving slowly or fidgety/restless 0   0 0  Suicidal  thoughts 0   0 0  PHQ-9 Score 0   3 8  Difficult doing work/chores Not difficult at all   Not difficult at all Not difficult at all   ..    09/09/2023   11:47 AM 05/02/2022    9:02 AM 01/31/2022    9:17 AM 12/20/2021    9:18 AM  GAD 7 : Generalized Anxiety Score  Nervous, Anxious, on Edge 0 0 1 2  Control/stop worrying 0 0 0 1  Worry too much - different things 0 0 0 1  Trouble relaxing 0 0 0 3  Restless 0 0 1 2  Easily annoyed or irritable 1 1 1 1   Afraid - awful might happen 0 0 0 1  Total GAD 7 Score 1 1 3 11   Anxiety Difficulty Not difficult at all Not difficult at all Not difficult at all Somewhat difficult     Physical Exam Constitutional:      Appearance: Normal appearance.  HENT:     Head: Normocephalic and atraumatic.  Eyes:     Extraocular Movements: Extraocular movements intact.  Cardiovascular:     Rate and Rhythm: Normal rate and regular rhythm.     Pulses: Normal pulses.     Heart sounds: Normal heart sounds.  Pulmonary:     Effort: Pulmonary effort is normal.     Breath sounds: Normal breath sounds.  Musculoskeletal:        General: Normal range of motion.     Cervical back: Normal range of motion.  Neurological:     Mental Status: She is alert.  Psychiatric:        Mood and Affect: Mood normal.        Behavior: Behavior normal.        Thought Content: Thought content normal.    Last CBC Lab Results  Component Value Date   WBC 4.8 09/27/2022   HGB 13.7 09/27/2022   HCT 40.9 09/27/2022   MCV 91 09/27/2022   MCH 30.5 09/27/2022   RDW 12.5 09/27/2022   PLT 260 09/27/2022   Last metabolic panel Lab Results  Component Value Date   GLUCOSE 98 05/07/2023   NA 142 05/07/2023   K 4.2 05/07/2023   CL 104 05/07/2023   CO2 24 05/07/2023   BUN 11 05/07/2023   CREATININE 0.82 05/07/2023   EGFR 87 05/07/2023   CALCIUM 9.6 05/07/2023   PROT 7.0 05/07/2023   ALBUMIN 4.4 05/07/2023   LABGLOB 2.6 05/07/2023   AGRATIO 1.8 09/27/2022   BILITOT 0.5  05/07/2023   ALKPHOS 66 05/07/2023   AST 18 05/07/2023   ALT 16 05/07/2023   Last  lipids Lab Results  Component Value Date   CHOL 208 (H) 05/07/2023   HDL 78 05/07/2023   LDLCALC 117 (H) 05/07/2023   TRIG 73 05/07/2023   CHOLHDL 2.7 05/07/2023   Last vitamin D Lab Results  Component Value Date   VD25OH 51.8 03/06/2023   Last vitamin B12 and Folate Lab Results  Component Value Date   VITAMINB12 932 03/06/2023   FOLATE >20.0 03/06/2023      The 10-year ASCVD risk score (Arnett DK, et al., 2019) is: 0.5%    Assessment & Plan:  Marland KitchenMarland KitchenZeniya "Drea" was seen today for medical management of chronic issues.  Diagnoses and all orders for this visit:  GAD (generalized anxiety disorder) -     LORazepam (ATIVAN) 0.5 MG tablet; Take 1 tablet (0.5 mg total) by mouth 2 (two) times daily as needed. for anxiety  Vitamin B12 deficiency -     cyanocobalamin (VITAMIN B12) 1000 MCG/ML injection; ADMINISTER 1 ML(1000 MCG) IN THE MUSCLE EVERY 21 days.  Visit for screening mammogram -     MM 3D SCREENING MAMMOGRAM BILATERAL BREAST; Future   Continue zoloft and as needed ativan PHQ/GAD no concerns.  B12 refilled.   Follow up in 6 months Tandy Gaw, PA-C

## 2023-09-09 ENCOUNTER — Encounter: Payer: Self-pay | Admitting: Physician Assistant

## 2023-09-09 MED ORDER — LORAZEPAM 0.5 MG PO TABS: 0.5000 mg | ORAL_TABLET | Freq: Two times a day (BID) | ORAL | 0 refills | Status: DC | PRN

## 2023-09-10 LAB — HM MAMMOGRAPHY

## 2023-09-12 DIAGNOSIS — F4323 Adjustment disorder with mixed anxiety and depressed mood: Secondary | ICD-10-CM | POA: Diagnosis not present

## 2023-09-26 DIAGNOSIS — F4323 Adjustment disorder with mixed anxiety and depressed mood: Secondary | ICD-10-CM | POA: Diagnosis not present

## 2023-10-01 DIAGNOSIS — L814 Other melanin hyperpigmentation: Secondary | ICD-10-CM | POA: Diagnosis not present

## 2023-10-01 DIAGNOSIS — D225 Melanocytic nevi of trunk: Secondary | ICD-10-CM | POA: Diagnosis not present

## 2023-10-01 DIAGNOSIS — L300 Nummular dermatitis: Secondary | ICD-10-CM | POA: Diagnosis not present

## 2023-10-01 DIAGNOSIS — D235 Other benign neoplasm of skin of trunk: Secondary | ICD-10-CM | POA: Diagnosis not present

## 2023-10-12 DIAGNOSIS — M9904 Segmental and somatic dysfunction of sacral region: Secondary | ICD-10-CM | POA: Diagnosis not present

## 2023-10-12 DIAGNOSIS — S335XXA Sprain of ligaments of lumbar spine, initial encounter: Secondary | ICD-10-CM | POA: Diagnosis not present

## 2023-10-12 DIAGNOSIS — M9903 Segmental and somatic dysfunction of lumbar region: Secondary | ICD-10-CM | POA: Diagnosis not present

## 2023-10-12 DIAGNOSIS — M9907 Segmental and somatic dysfunction of upper extremity: Secondary | ICD-10-CM | POA: Diagnosis not present

## 2023-10-12 DIAGNOSIS — S336XXA Sprain of sacroiliac joint, initial encounter: Secondary | ICD-10-CM | POA: Diagnosis not present

## 2023-10-12 DIAGNOSIS — M25511 Pain in right shoulder: Secondary | ICD-10-CM | POA: Diagnosis not present

## 2023-10-16 ENCOUNTER — Encounter: Payer: Self-pay | Admitting: Physician Assistant

## 2023-10-19 DIAGNOSIS — S336XXA Sprain of sacroiliac joint, initial encounter: Secondary | ICD-10-CM | POA: Diagnosis not present

## 2023-10-19 DIAGNOSIS — M9904 Segmental and somatic dysfunction of sacral region: Secondary | ICD-10-CM | POA: Diagnosis not present

## 2023-10-19 DIAGNOSIS — M9903 Segmental and somatic dysfunction of lumbar region: Secondary | ICD-10-CM | POA: Diagnosis not present

## 2023-10-19 DIAGNOSIS — S335XXA Sprain of ligaments of lumbar spine, initial encounter: Secondary | ICD-10-CM | POA: Diagnosis not present

## 2023-10-21 ENCOUNTER — Encounter: Payer: Self-pay | Admitting: Physician Assistant

## 2023-10-21 ENCOUNTER — Telehealth: Payer: Self-pay

## 2023-10-21 NOTE — Telephone Encounter (Signed)
 09/10/23 Mammogram has been abstracted.

## 2023-10-21 NOTE — Telephone Encounter (Signed)
 Message sent to patient via Mychart requeting a return call to schedule a nurse visit for Tdap administration.

## 2023-10-21 NOTE — Telephone Encounter (Signed)
 Copied from CRM [id number]. Topic: General - Other >> Oct 18, 2023  3:39 PM Hamdi H wrote: Reason for CRM: Patient calling back to see if her provider responded to her Mychart message about tetanus shot. Please either respond through [place] or phone call.

## 2023-10-26 DIAGNOSIS — S335XXA Sprain of ligaments of lumbar spine, initial encounter: Secondary | ICD-10-CM | POA: Diagnosis not present

## 2023-10-26 DIAGNOSIS — M9907 Segmental and somatic dysfunction of upper extremity: Secondary | ICD-10-CM | POA: Diagnosis not present

## 2023-10-26 DIAGNOSIS — S336XXA Sprain of sacroiliac joint, initial encounter: Secondary | ICD-10-CM | POA: Diagnosis not present

## 2023-10-26 DIAGNOSIS — M9903 Segmental and somatic dysfunction of lumbar region: Secondary | ICD-10-CM | POA: Diagnosis not present

## 2023-10-26 DIAGNOSIS — M25511 Pain in right shoulder: Secondary | ICD-10-CM | POA: Diagnosis not present

## 2023-10-26 DIAGNOSIS — M9904 Segmental and somatic dysfunction of sacral region: Secondary | ICD-10-CM | POA: Diagnosis not present

## 2023-11-09 DIAGNOSIS — S335XXA Sprain of ligaments of lumbar spine, initial encounter: Secondary | ICD-10-CM | POA: Diagnosis not present

## 2023-11-09 DIAGNOSIS — M9903 Segmental and somatic dysfunction of lumbar region: Secondary | ICD-10-CM | POA: Diagnosis not present

## 2023-11-09 DIAGNOSIS — M9904 Segmental and somatic dysfunction of sacral region: Secondary | ICD-10-CM | POA: Diagnosis not present

## 2023-11-09 DIAGNOSIS — S336XXA Sprain of sacroiliac joint, initial encounter: Secondary | ICD-10-CM | POA: Diagnosis not present

## 2023-11-14 ENCOUNTER — Telehealth: Payer: Self-pay

## 2023-11-14 NOTE — Telephone Encounter (Signed)
 Copied from CRM [id number]. Topic: Appointments - Appointment Scheduling >> Nov 14, 2023  3:52 PM Joanna Baker wrote: Patient is wanting to ask some questions about being seen because she has chronic neck and shoulder pain, and she called OT and since she hasn't seen them since dec she needs to be seen by provider for new order, patients callback number is [phone]. Patient wants to know can she do a vide visit since she works out of [place] now?

## 2023-11-14 NOTE — Telephone Encounter (Signed)
 Please contact the patient to schedule an appt with her provider or Dr. Elva Hamburger. Thanks in advance.

## 2023-11-18 ENCOUNTER — Ambulatory Visit
Admission: RE | Admit: 2023-11-18 | Discharge: 2023-11-18 | Disposition: A | Discharge status: Home / Self Care | Source: Ambulatory Visit | Attending: Family Medicine | Admitting: Family Medicine

## 2023-11-18 ENCOUNTER — Telehealth: Payer: Self-pay

## 2023-11-18 VITALS — BP 115/79 | HR 85 | Temp 99.4°F | Resp 18

## 2023-11-18 DIAGNOSIS — M5090 Cervical disc disorder, unspecified, unspecified cervical region: Secondary | ICD-10-CM

## 2023-11-18 DIAGNOSIS — M542 Cervicalgia: Secondary | ICD-10-CM

## 2023-11-18 MED ORDER — METHYLPREDNISOLONE ACETATE 80 MG/ML IJ SUSP
80.0000 mg | Freq: Once | INTRAMUSCULAR | Status: AC
  Administered 2023-11-18: 80 mg via INTRAMUSCULAR

## 2023-11-18 MED ORDER — METAXALONE 800 MG PO TABS: 800.0000 mg | ORAL_TABLET | Freq: Three times a day (TID) | ORAL | 0 refills | Status: AC

## 2023-11-18 MED ORDER — IBUPROFEN 800 MG PO TABS: 800.0000 mg | ORAL_TABLET | Freq: Three times a day (TID) | ORAL | 0 refills | Status: DC

## 2023-11-18 NOTE — Discharge Instructions (Signed)
 I have prescribed metaxalone (Skelaxin) as a muscle relaxer.  This is the least drowsy muscle relaxer.  You will need to try and see how it works for you.  I believe will be safe at bedtime The steroid shot will last for several days.  I reviewed your chart, and Jade has given you this before Use ice or heat to painful area.  Avoid activities that are painful Follow-up with Dr. Sandy Crumb

## 2023-11-18 NOTE — ED Triage Notes (Signed)
 Patient c/o right shoulder pain x 3 weeks.  Patient was diagnosed with a pinched nerve a couple of years ago by Dr T.  It recently flared up, pain radiates from neck, behind the shoulder, down arm, elbow and pinky finger.  Patient has taken Aleve and applied Hemp Cream.

## 2023-11-18 NOTE — Telephone Encounter (Signed)
 Pt called with a question regarding what is on her AVS

## 2023-11-18 NOTE — ED Provider Notes (Signed)
 [facility]    CSN: [id number] Arrival date & time: 11/18/23  1710      History   Chief Complaint Chief Complaint  Patient presents with   Shoulder Pain    The pinched nerve in my c4 - C6 vertebra is that a level 20 out of 10 - Entered by patient    HPI Joanna Baker is a 53 y.o. female.   Patient has known cervical degenerative disc disease.  An MRI was done a couple of years ago to document this.  She is usually functional with very little pain, however, started new job.  This involves a lot of driving.  She also has a new workstation that is not as ergonomic.  She has had no real accident or injury but she has progressively worsening right neck and shoulder pain that goes down into her right arm.  Occasional numbness in her right 4th and 5th fingers.  She states the pain at times is excruciating.  She does take occasional Flexeril  but finds if she takes even a whole Flexeril  at night she is unable to get up the next morning and drive to [place] for her new job.  She has been cutting them in half.  Unfortunately they are not as effective.  Past Medical History:  Diagnosis Date   ACL injury tear    Right knee, not repaired.     Anxiety    Asthma    Depression    Fatty liver    IBS (irritable bowel syndrome)    TB (tuberculosis), treated     6 month of INH at age 89    Patient Active Problem List   Diagnosis Date Noted   Rotator cuff tendonitis 06/10/2023   Right supraspinatus tendonitis 06/10/2023   Tendinitis of right infraspinatus tendon 06/10/2023   Dental erosion 03/06/2023   Right foot pain 03/06/2023   Fatty liver, alcoholic 10/03/2022   Arthritis of finger 10/03/2022   Arthralgia 05/02/2022   Medial epicondylitis, left 01/11/2022   Atrial septal defect    Hypotension 06/19/2021   Trouble in sleeping 01/29/2021   Atypical chest pain 01/23/2021   Radiculitis of right cervical region 11/30/2020   Wheezing 11/21/2020   Chronic right shoulder pain  01/06/2020   Cough 01/06/2020   Environmental allergies 01/06/2020   Seasonal allergies 01/06/2020   BPPV (benign paroxysmal positional vertigo), left 12/12/2018   Bone pain 12/12/2018   Hand eczema 05/16/2018   Pain of right thumb 05/16/2018   Pain of right heel 05/16/2018   Multiple joint pain 05/16/2018   Lymphadenopathy 05/16/2018   Right peroneal tendinosis 02/26/2018   PVC's (premature ventricular contractions) 10/27/2017   Atypical ductal hyperplasia of left breast 08/05/2017   Breast calcification, left 07/26/2017   SOB (shortness of breath) 12/24/2016   Palpitations 12/24/2016   Vitamin B12 deficiency 12/24/2016   Right ankle swelling 12/24/2016   Family history of CHF (congestive heart failure) 12/24/2016   Pernicious anemia 05/22/2016   Vitamin D  insufficiency 05/22/2016   DUB (dysfunctional uterine bleeding) 05/21/2016   Right-sided chest pain 05/18/2016   Radicular syndrome of right leg 05/18/2016   Pulsatile tinnitus of left ear 05/18/2016   Mass of mandible 09/02/2015   Right upper quadrant abdominal pain 09/02/2015   ACL tear 07/19/2015   Premenstrual symptom 11/07/2014   Depression 12/11/2013   GAD (generalized anxiety disorder) 12/11/2013   Fatty liver disease, nonalcoholic 11/02/2013   Right lumbar radiculitis 01/13/2013   Hemorrhoid 08/03/2011    Past  Surgical History:  Procedure Laterality Date   COLONOSCOPY     excision cells breast Left    TEE WITHOUT CARDIOVERSION N/A 08/15/2021   Procedure: TRANSESOPHAGEAL ECHOCARDIOGRAM (TEE);  Surgeon: Luana Rumple, MD;  Location[facility] ENDOSCOPY;  Service: Cardiovascular;  Laterality: N/A;   TONSILLECTOMY  07/17/1991    OB History   No obstetric history on file.      Home Medications    Prior to Admission medications   Medication Sig Start Date End Date Taking? Authorizing Provider  cyanocobalamin  (VITAMIN B12) 1000 MCG/ML injection ADMINISTER 1 ML(1000 MCG) IN THE MUSCLE EVERY 21 days. 09/06/23  Yes  Breeback, Jade L, PA-C  EPINEPHrine  0.3 mg/0.3 mL IJ SOAJ injection Inject 0.3 mg into the muscle as needed for anaphylaxis. 10/11/22  Yes Cydney Draft, MD  ibuprofen (ADVIL) 800 MG tablet Take 800 mg by mouth every 8 (eight) hours as needed (pain.). 07/14/21  Yes [provider]  ipratropium (ATROVENT ) 0.06 % nasal spray PLACE 2 SPRAYS INTO BOTH NOSTRILS 4 TIMES DAILY. 12/20/21  Yes Breeback, Jade L, PA-C  LORazepam  (ATIVAN ) 0.5 MG tablet Take 1 tablet (0.5 mg total) by mouth 2 (two) times daily as needed. for anxiety 09/09/23  Yes Breeback, Jade L, PA-C  metaxalone (SKELAXIN) 800 MG tablet Take 1 tablet (800 mg total) by mouth 3 (three) times daily. 11/18/23  Yes Stephany Ehrich, MD  naproxen sodium (ANAPROX) 220 MG tablet Take 220 mg by mouth daily as needed (Pain).   Yes [provider]  NEEDLE, DISP, 25 G (B-D DISP NEEDLE 25GX1") 25G X 1" MISC Use with monthly B12 injections. 05/02/22  Yes Breeback, Jade L, PA-C  sertraline  (ZOLOFT ) 25 MG tablet Take 1 tablet (25 mg total) by mouth daily. 03/06/23  Yes Breeback, Jade L, PA-C  cyclobenzaprine  (FLEXERIL ) 10 MG tablet 1/2-1 tab p.o. nightly as needed. 08/08/22   Breeback, Jade L, PA-C  diclofenac  Sodium (VOLTAREN ) 1 % GEL Apply 4 g topically 4 (four) times daily. To affected joint. 05/02/22   Araceli Knight, PA-C    Family History Family History  Problem Relation Age of Onset   Pulmonary embolism Mother    Stroke Mother 59   Alcohol abuse Father    Suicidality Father    Heart failure Father    Alcoholism Father    Diabetes Father    Heart attack Maternal Grandmother    Depression Paternal Grandfather    Breast cancer Neg Hx     Social History Social History   Tobacco Use   Smoking status: Former    Current packs/day: 0.25    Average packs/day: 0.3 packs/day for 5.0 years (1.3 ttl pk-yrs)    Types: Cigarettes   Smokeless tobacco: Never  Vaping Use   Vaping status: Never Used  Substance Use Topics    Alcohol use: Yes    Alcohol/week: 2.0 standard drinks of alcohol    Types: 2 Cans of beer per week    Comment: Occasional   Drug use: Not Currently    Types: Marijuana    Comment: history of use, not current use     Allergies   Influenza vaccines, Iodine, Chantix [varenicline tartrate], Lobster [shellfish allergy ], Penicillins, Prozac  [fluoxetine  hcl], and Sulfa antibiotics   Review of Systems Review of Systems  See HPI Physical Exam Triage Vital Signs ED Triage Vitals  Encounter Vitals Group     BP 11/18/23 1718 115/79     Systolic BP Percentile --      Diastolic  BP Percentile --      Pulse Rate 11/18/23 1718 85     Resp 11/18/23 1718 18     Temp 11/18/23 1718 99.4 F (37.4 C)     Temp Source 11/18/23 1718 Oral     SpO2 11/18/23 1718 94 %     Weight --      Height --      Head Circumference --      Peak Flow --      Pain Score 11/18/23 1720 10     Pain Loc --      Pain Education --      Exclude from Growth Chart --    No data found.  Updated Vital Signs BP 115/79 (BP Location: Right Arm)   Pulse 85   Temp 99.4 F (37.4 C) (Oral)   Resp 18   SpO2 94%       Physical Exam Constitutional:      General: She is not in acute distress.    Appearance: She is well-developed.     Comments: Patient has mildly guarded movements.  Appears largely comfortable  HENT:     Head: Normocephalic and atraumatic.  Eyes:     Conjunctiva/sclera: Conjunctivae normal.     Pupils: Pupils are equal, round, and reactive to light.  Cardiovascular:     Rate and Rhythm: Normal rate.  Pulmonary:     Effort: Pulmonary effort is normal. No respiratory distress.  Abdominal:     General: There is no distension.     Palpations: Abdomen is soft.  Musculoskeletal:        General: Tenderness present. Normal range of motion.     Cervical back: Normal range of motion.     Comments: Tenderness in the right upper body of the trapezius and to the right of the C6-7 region.  Increased muscle  tone is palpable.  Strength, sensation, range of motion, and reflexes are intact and symmetric in both upper extremities  Skin:    General: Skin is warm and dry.  Neurological:     Mental Status: She is alert.      UC Treatments / Results  Labs (all labs ordered are listed, but only abnormal results are displayed) Labs Reviewed - No data to display  EKG   Radiology No results found.  Procedures Procedures (including critical care time)  Medications Ordered in UC Medications  methylPREDNISolone  acetate (DEPO-MEDROL ) injection 80 mg (80 mg Intramuscular Given 11/18/23 1744)    Initial Impression / Assessment and Plan / UC Course  I have reviewed the triage vital signs and the nursing notes.  Pertinent labs & imaging results that were available during my care of the patient were reviewed by me and considered in my medical decision making (see chart for details).     Patient request refill of ibuprofen for pain We will try metaxalone as it is a less drowsy muscle relaxer.  Less effective, but may be helpful She is given a shot of Depo-Medrol  for the nerve root inflammation She will follow-up with Dr. Sandy Crumb Final Clinical Impressions(s) / UC Diagnoses   Final diagnoses:  Cervical neck pain with evidence of disc disease  Neck pain on left side     Discharge Instructions      I have prescribed metaxalone (Skelaxin) as a muscle relaxer.  This is the least drowsy muscle relaxer.  You will need to try and see how it works for you.  I believe will be safe at bedtime  The steroid shot will last for several days.  I reviewed your chart, and Jade has given you this before Use ice or heat to painful area.  Avoid activities that are painful Follow-up with Dr. Sandy Crumb   ED Prescriptions     Medication Sig Dispense Auth. Provider   metaxalone (SKELAXIN) 800 MG tablet Take 1 tablet (800 mg total) by mouth 3 (three) times daily. 21 tablet Stephany Ehrich, MD       PDMP not reviewed this encounter.   Stephany Ehrich, MD 11/18/23 [phone]

## 2023-11-21 ENCOUNTER — Encounter: Payer: Self-pay | Admitting: Sports Medicine

## 2023-11-21 ENCOUNTER — Telehealth: Admitting: Sports Medicine

## 2023-11-21 DIAGNOSIS — F411 Generalized anxiety disorder: Secondary | ICD-10-CM

## 2023-11-21 DIAGNOSIS — M5412 Radiculopathy, cervical region: Secondary | ICD-10-CM | POA: Diagnosis not present

## 2023-11-21 MED ORDER — LORAZEPAM 0.5 MG PO TABS: 0.5000 mg | ORAL_TABLET | Freq: Two times a day (BID) | ORAL | 0 refills | Status: DC | PRN

## 2023-11-21 MED ORDER — PREDNISONE 10 MG (48) PO TBPK: ORAL_TABLET | Freq: Every day | ORAL | 0 refills | Status: DC

## 2023-11-21 NOTE — Assessment & Plan Note (Signed)
 Increasing neck pain with radiation down the right arm, partial improvement with intramuscular Depo-Medrol , I do think she needs some oral steroids, we will send this in, adding some lorazepam  as she does get increased anxiety with steroids. She is agreeable to do some home physical therapy and then return to see me, we can consider an epidural if not better. Of note MRI from recently did show C5-C6 DDD likely compressing the right C6 nerve root.

## 2023-11-21 NOTE — Progress Notes (Signed)
   Virtual Visit via WebEx/MyChart   I connected with  Joanna Baker  on 11/21/23 via WebEx/MyChart/Doximity Video and verified that I am speaking with the correct person using two identifiers.   I discussed the limitations, risks, security and privacy concerns of performing an evaluation and management service by WebEx/MyChart/Doximity Video, including the higher likelihood of inaccurate diagnosis and treatment, and the availability of in person appointments.  We also discussed the likely need of an additional face to face encounter for complete and high quality delivery of care.  I also discussed with the patient that there may be a patient responsible charge related to this service. The patient expressed understanding and wishes to proceed.  Provider location is in medical facility. Patient location is at their home, different from provider location. People involved in care of the patient during this telehealth encounter were myself, my nurse/medical assistant, and my front office/scheduling team member.  Review of Systems: No fevers, chills, night sweats, weight loss, chest pain, or shortness of breath.   Objective Findings:    General: Speaking full sentences, no audible heavy breathing.  Sounds alert and appropriately interactive.  Appears well.  Face symmetric.  Extraocular movements intact.  Pupils equal and round.  No nasal flaring or accessory muscle use visualized.  Independent interpretation of tests performed by another provider:   None.  Brief History, Exam, Impression, and Recommendations:    Radiculitis of right cervical region Increasing neck pain with radiation down the right arm, partial improvement with intramuscular Depo-Medrol , I do think she needs some oral steroids, we will send this in, adding some lorazepam  as she does get increased anxiety with steroids. She is agreeable to do some home physical therapy and then return to see me, we can consider an epidural if not  better. Of note MRI from recently did show C5-C6 DDD likely compressing the right C6 nerve root.   I discussed the above assessment and treatment plan with the patient. The patient was provided an opportunity to ask questions and all were answered. The patient agreed with the plan and demonstrated an understanding of the instructions.   The patient was advised to call back or seek an in-person evaluation if the symptoms worsen or if the condition fails to improve as anticipated.   I provided 30 minutes of face to face and non-face-to-face time during this encounter date, time was needed to gather information, review chart, records, communicate/coordinate with staff remotely, as well as complete documentation.   ____________________________________________ Joselyn Nicely. Sandy Crumb, M.D., ABFM., CAQSM., AME. Primary Care and Sports Medicine [facility]  Adjunct Professor of [facility] Medicine  [employer]  Restaurant manager, fast food

## 2023-11-29 DIAGNOSIS — M9902 Segmental and somatic dysfunction of thoracic region: Secondary | ICD-10-CM | POA: Diagnosis not present

## 2023-11-29 DIAGNOSIS — M9907 Segmental and somatic dysfunction of upper extremity: Secondary | ICD-10-CM | POA: Diagnosis not present

## 2023-11-29 DIAGNOSIS — M546 Pain in thoracic spine: Secondary | ICD-10-CM | POA: Diagnosis not present

## 2023-11-29 DIAGNOSIS — M25511 Pain in right shoulder: Secondary | ICD-10-CM | POA: Diagnosis not present

## 2023-12-02 ENCOUNTER — Ambulatory Visit

## 2023-12-06 DIAGNOSIS — M9902 Segmental and somatic dysfunction of thoracic region: Secondary | ICD-10-CM | POA: Diagnosis not present

## 2023-12-06 DIAGNOSIS — M546 Pain in thoracic spine: Secondary | ICD-10-CM | POA: Diagnosis not present

## 2023-12-06 DIAGNOSIS — M9907 Segmental and somatic dysfunction of upper extremity: Secondary | ICD-10-CM | POA: Diagnosis not present

## 2023-12-06 DIAGNOSIS — M25511 Pain in right shoulder: Secondary | ICD-10-CM | POA: Diagnosis not present

## 2023-12-14 DIAGNOSIS — M546 Pain in thoracic spine: Secondary | ICD-10-CM | POA: Diagnosis not present

## 2023-12-14 DIAGNOSIS — M25511 Pain in right shoulder: Secondary | ICD-10-CM | POA: Diagnosis not present

## 2023-12-14 DIAGNOSIS — M9902 Segmental and somatic dysfunction of thoracic region: Secondary | ICD-10-CM | POA: Diagnosis not present

## 2023-12-14 DIAGNOSIS — M9907 Segmental and somatic dysfunction of upper extremity: Secondary | ICD-10-CM | POA: Diagnosis not present

## 2023-12-26 DIAGNOSIS — M542 Cervicalgia: Secondary | ICD-10-CM | POA: Diagnosis not present

## 2024-01-01 ENCOUNTER — Encounter: Payer: Self-pay | Admitting: Physician Assistant

## 2024-01-01 DIAGNOSIS — M542 Cervicalgia: Secondary | ICD-10-CM | POA: Diagnosis not present

## 2024-01-01 MED ORDER — SERTRALINE HCL 50 MG PO TABS: 50.0000 mg | ORAL_TABLET | Freq: Every day | ORAL | 0 refills | Status: DC

## 2024-01-03 ENCOUNTER — Ambulatory Visit: Admitting: Sports Medicine

## 2024-01-03 DIAGNOSIS — M542 Cervicalgia: Secondary | ICD-10-CM | POA: Diagnosis not present

## 2024-01-03 DIAGNOSIS — M5412 Radiculopathy, cervical region: Secondary | ICD-10-CM

## 2024-01-03 MED ORDER — PREGABALIN 25 MG PO CAPS: ORAL_CAPSULE | ORAL | 3 refills | Status: DC

## 2024-01-03 NOTE — Assessment & Plan Note (Signed)
 Neck pain with radiation down the right arm to the thumb, C6 distribution, overall she is doing okay, we discussed the additional treatment modalities including medication, therapy, injections, and surgery. She is going to do some additional therapy with [facility] rehab specialist, we are can start Lyrica and a gentle up titration, and she can return to see me in about 4 to 6 weeks.

## 2024-01-03 NOTE — Progress Notes (Signed)
    Procedures performed today:    None.  Independent interpretation of notes and tests performed by another provider:   None.  Brief History, Exam, Impression, and Recommendations:    Radiculitis of right cervical region Neck pain with radiation down the right arm to the thumb, C6 distribution, overall she is doing okay, we discussed the additional treatment modalities including medication, therapy, injections, and surgery. She is going to do some additional therapy with [facility] rehab specialist, we are can start Lyrica and a gentle up titration, and she can return to see me in about 4 to 6 weeks.    ____________________________________________ Joselyn Nicely. Sandy Crumb, M.D., ABFM., CAQSM., AME. Primary Care and Sports Medicine [facility]  Adjunct Professor of [facility] [employer] [employer] [facility] Medicine  [employer]  Restaurant manager, fast food

## 2024-01-05 ENCOUNTER — Encounter: Payer: Self-pay | Admitting: Sports Medicine

## 2024-01-07 ENCOUNTER — Telehealth: Payer: Self-pay

## 2024-01-07 ENCOUNTER — Other Ambulatory Visit (HOSPITAL_COMMUNITY): Payer: Self-pay

## 2024-01-07 NOTE — Telephone Encounter (Signed)
 Pharmacy Patient Advocate Encounter   Received notification from CoverMyMeds that prior authorization for Pregabalin  25mg  caps is required/requested.   Insurance verification completed.   The patient is insured through [facility] .   Per test claim: PA required; PA submitted to above mentioned insurance via CoverMyMeds Key/confirmation #/EOC [id number] Status is pending

## 2024-01-07 NOTE — Telephone Encounter (Signed)
 Pharmacy Patient Advocate Encounter  Received notification from [facility] that Prior Authorization for Pregabalin  25mg  caps has been DENIED.  See denial reason below. No denial letter attached in CMM. Will attach denial letter to Media tab once received.   PA #/Case ID/Reference #: [id number]

## 2024-01-08 DIAGNOSIS — M542 Cervicalgia: Secondary | ICD-10-CM | POA: Diagnosis not present

## 2024-01-08 DIAGNOSIS — Z6831 Body mass index (BMI) 31.0-31.9, adult: Secondary | ICD-10-CM | POA: Diagnosis not present

## 2024-01-08 DIAGNOSIS — Z1231 Encounter for screening mammogram for malignant neoplasm of breast: Secondary | ICD-10-CM | POA: Diagnosis not present

## 2024-01-08 DIAGNOSIS — Z1331 Encounter for screening for depression: Secondary | ICD-10-CM | POA: Diagnosis not present

## 2024-01-08 DIAGNOSIS — Z01419 Encounter for gynecological examination (general) (routine) without abnormal findings: Secondary | ICD-10-CM | POA: Diagnosis not present

## 2024-01-16 DIAGNOSIS — M542 Cervicalgia: Secondary | ICD-10-CM | POA: Diagnosis not present

## 2024-01-20 NOTE — Telephone Encounter (Signed)
 Okay please just have her pay cash with a good Rx coupon

## 2024-01-20 NOTE — Telephone Encounter (Signed)
 The PA for Pregabalin  was denied by the insurance. Patient has been updated of the insurance determination via a MyChart message.

## 2024-01-21 ENCOUNTER — Other Ambulatory Visit: Payer: Self-pay

## 2024-01-21 NOTE — Progress Notes (Signed)
 Opened in error

## 2024-01-23 DIAGNOSIS — M542 Cervicalgia: Secondary | ICD-10-CM | POA: Diagnosis not present

## 2024-01-23 DIAGNOSIS — F4323 Adjustment disorder with mixed anxiety and depressed mood: Secondary | ICD-10-CM | POA: Diagnosis not present

## 2024-01-24 ENCOUNTER — Encounter: Admitting: Physician Assistant

## 2024-01-29 ENCOUNTER — Encounter: Payer: Self-pay | Admitting: Sports Medicine

## 2024-01-29 ENCOUNTER — Ambulatory Visit (INDEPENDENT_AMBULATORY_CARE_PROVIDER_SITE_OTHER): Admitting: Sports Medicine

## 2024-01-29 ENCOUNTER — Ambulatory Visit

## 2024-01-29 DIAGNOSIS — M2392 Unspecified internal derangement of left knee: Secondary | ICD-10-CM

## 2024-01-29 DIAGNOSIS — T07XXXA Unspecified multiple injuries, initial encounter: Secondary | ICD-10-CM | POA: Diagnosis not present

## 2024-01-29 DIAGNOSIS — M7989 Other specified soft tissue disorders: Secondary | ICD-10-CM | POA: Diagnosis not present

## 2024-01-29 DIAGNOSIS — M25552 Pain in left hip: Secondary | ICD-10-CM | POA: Diagnosis not present

## 2024-01-29 DIAGNOSIS — M7062 Trochanteric bursitis, left hip: Secondary | ICD-10-CM

## 2024-01-29 DIAGNOSIS — M25562 Pain in left knee: Secondary | ICD-10-CM | POA: Diagnosis not present

## 2024-01-29 DIAGNOSIS — M7060 Trochanteric bursitis, unspecified hip: Secondary | ICD-10-CM | POA: Diagnosis not present

## 2024-01-29 DIAGNOSIS — Z0189 Encounter for other specified special examinations: Secondary | ICD-10-CM | POA: Diagnosis not present

## 2024-01-29 DIAGNOSIS — M160 Bilateral primary osteoarthritis of hip: Secondary | ICD-10-CM | POA: Diagnosis not present

## 2024-01-29 NOTE — Assessment & Plan Note (Signed)
 Pleasant 53 year old female, multiple injuries left knee, now with persistent pain anteriorly, medially, positive Lachman sign, pain with terminal extension, question meniscal tearing and ACL disruption, adding x-rays, MRI, she will use over-the-counter analgesics in the meantime, continue knee sleeve.

## 2024-01-29 NOTE — Progress Notes (Signed)
    Procedures performed today:    None.  Independent interpretation of notes and tests performed by another provider:   None.  Brief History, Exam, Impression, and Recommendations:    Internal derangement of left knee Pleasant 53 year old female, multiple injuries left knee, now with persistent pain anteriorly, medially, positive Lachman sign, pain with terminal extension, question meniscal tearing and ACL disruption, adding x-rays, MRI, she will use over-the-counter analgesics in the meantime, continue knee sleeve.  Greater trochanteric bursitis, left Also with pain left lateral hip, exquisitely weak hip abductors, pathophysiology explained, adding x-rays, aggressive PT at [facility] rehab specialist per patient request, return to see me 6 weeks.    ____________________________________________ Debby PARAS. Curtis, M.D., ABFM., CAQSM., AME. Primary Care and Sports Medicine [facility]  Adjunct Professor of [facility] Medicine  [employer]  Restaurant manager, fast food

## 2024-01-29 NOTE — Assessment & Plan Note (Signed)
 Also with pain left lateral hip, exquisitely weak hip abductors, pathophysiology explained, adding x-rays, aggressive PT at [facility] rehab specialist per patient request, return to see me 6 weeks.

## 2024-01-30 ENCOUNTER — Ambulatory Visit: Admitting: Sports Medicine

## 2024-01-30 DIAGNOSIS — M542 Cervicalgia: Secondary | ICD-10-CM | POA: Diagnosis not present

## 2024-01-30 DIAGNOSIS — M25562 Pain in left knee: Secondary | ICD-10-CM | POA: Diagnosis not present

## 2024-02-01 ENCOUNTER — Other Ambulatory Visit: Payer: Self-pay | Admitting: Medical Genetics

## 2024-02-01 ENCOUNTER — Ambulatory Visit

## 2024-02-01 DIAGNOSIS — M2392 Unspecified internal derangement of left knee: Secondary | ICD-10-CM | POA: Diagnosis not present

## 2024-02-03 ENCOUNTER — Ambulatory Visit: Payer: Self-pay | Admitting: Sports Medicine

## 2024-02-03 ENCOUNTER — Encounter: Payer: Self-pay | Admitting: Physician Assistant

## 2024-02-05 ENCOUNTER — Encounter: Payer: Self-pay | Admitting: Physician Assistant

## 2024-02-05 ENCOUNTER — Ambulatory Visit (INDEPENDENT_AMBULATORY_CARE_PROVIDER_SITE_OTHER): Admitting: Physician Assistant

## 2024-02-05 VITALS — BP 102/60 | HR 73 | Ht 66.0 in | Wt 206.0 lb

## 2024-02-05 DIAGNOSIS — E559 Vitamin D deficiency, unspecified: Secondary | ICD-10-CM

## 2024-02-05 DIAGNOSIS — Z131 Encounter for screening for diabetes mellitus: Secondary | ICD-10-CM | POA: Diagnosis not present

## 2024-02-05 DIAGNOSIS — E538 Deficiency of other specified B group vitamins: Secondary | ICD-10-CM

## 2024-02-05 DIAGNOSIS — Z0000 Encounter for general adult medical examination without abnormal findings: Secondary | ICD-10-CM

## 2024-02-05 DIAGNOSIS — T7840XD Allergy, unspecified, subsequent encounter: Secondary | ICD-10-CM

## 2024-02-05 DIAGNOSIS — F3342 Major depressive disorder, recurrent, in full remission: Secondary | ICD-10-CM | POA: Diagnosis not present

## 2024-02-05 DIAGNOSIS — Z13220 Encounter for screening for lipoid disorders: Secondary | ICD-10-CM | POA: Diagnosis not present

## 2024-02-05 DIAGNOSIS — F411 Generalized anxiety disorder: Secondary | ICD-10-CM

## 2024-02-05 DIAGNOSIS — M5412 Radiculopathy, cervical region: Secondary | ICD-10-CM

## 2024-02-05 MED ORDER — SERTRALINE HCL 25 MG PO TABS: 25.0000 mg | ORAL_TABLET | Freq: Every day | ORAL | 4 refills | Status: AC

## 2024-02-05 MED ORDER — EPINEPHRINE 0.3 MG/0.3ML IJ SOAJ
0.3000 mg | INTRAMUSCULAR | 1 refills | Status: AC | PRN
Start: 2024-02-05 — End: ?

## 2024-02-05 MED ORDER — LORAZEPAM 0.5 MG PO TABS: 0.5000 mg | ORAL_TABLET | Freq: Two times a day (BID) | ORAL | 0 refills | Status: DC | PRN

## 2024-02-05 NOTE — Progress Notes (Signed)
 Complete physical exam  Patient: Joanna Baker   DOB: 1971-01-05   53 y.o. Female  MRN: [id number]  Subjective:    Chief Complaint  Patient presents with   Annual Exam    Fasting labs    Carsen Baker is a 53 y.o. female who presents today for a complete physical exam. She reports consuming a general diet. Pt is very active and intermittently does yoga and palates. She has had some injuries and pain that have kept her from being as active lately.  She generally feels well. She reports sleeping well. She does have additional problems to discuss today.   She does need a refill on her epipen . She would like to decrease zoloft  to 25mg  daily. Her mood is good. Anxiety is well controlled. She continues to use ativan  very sparingly for anxiety.    Most recent fall risk assessment:    10/03/2022    9:34 AM  Fall Risk   Falls in the past year? 0  Number falls in past yr: 0  Injury with Fall? 0  Risk for fall due to : No Fall Risks  Follow up Falls evaluation completed     Most recent depression screenings:    09/09/2023   11:47 AM 10/03/2022    9:34 AM  PHQ 2/9 Scores  PHQ - 2 Score 0 0  PHQ- 9 Score 0     Vision:Within last year and Dental: No current dental problems and Receives regular dental care  Patient Active Problem List   Diagnosis Date Noted   Internal derangement of left knee 01/29/2024   Greater trochanteric bursitis, left 01/29/2024   Rotator cuff tendonitis 06/10/2023   Right supraspinatus tendonitis 06/10/2023   Tendinitis of right infraspinatus tendon 06/10/2023   Dental erosion 03/06/2023   Right foot pain 03/06/2023   Fatty liver, alcoholic 10/03/2022   Arthritis of finger 10/03/2022   Arthralgia 05/02/2022   Medial epicondylitis, left 01/11/2022   Atrial septal defect    Hypotension 06/19/2021   Trouble in sleeping 01/29/2021   Atypical chest pain 01/23/2021   Radiculitis of right cervical region 11/30/2020   Wheezing 11/21/2020   Chronic right  shoulder pain 01/06/2020   Cough 01/06/2020   Environmental allergies 01/06/2020   Seasonal allergies 01/06/2020   BPPV (benign paroxysmal positional vertigo), left 12/12/2018   Bone pain 12/12/2018   Hand eczema 05/16/2018   Pain of right thumb 05/16/2018   Pain of right heel 05/16/2018   Multiple joint pain 05/16/2018   Lymphadenopathy 05/16/2018   Right peroneal tendinosis 02/26/2018   PVC's (premature ventricular contractions) 10/27/2017   Atypical ductal hyperplasia of left breast 08/05/2017   Breast calcification, left 07/26/2017   SOB (shortness of breath) 12/24/2016   Palpitations 12/24/2016   Vitamin B12 deficiency 12/24/2016   Right ankle swelling 12/24/2016   Family history of CHF (congestive heart failure) 12/24/2016   Pernicious anemia 05/22/2016   Vitamin D  insufficiency 05/22/2016   DUB (dysfunctional uterine bleeding) 05/21/2016   Right-sided chest pain 05/18/2016   Radicular syndrome of right leg 05/18/2016   Pulsatile tinnitus of left ear 05/18/2016   Mass of mandible 09/02/2015   Right upper quadrant abdominal pain 09/02/2015   ACL tear 07/19/2015   Premenstrual symptom 11/07/2014   Depression 12/11/2013   GAD (generalized anxiety disorder) 12/11/2013   Fatty liver disease, nonalcoholic 11/02/2013   Right lumbar radiculitis 01/13/2013   Hemorrhoid 08/03/2011   Past Medical History:  Diagnosis Date   ACL injury tear  Right knee, not repaired.     Anxiety    Asthma    Depression    Fatty liver    IBS (irritable bowel syndrome)    TB (tuberculosis), treated     6 month of INH at age 82   Family Status  Relation Name Status   Mother  Alive   Father  Deceased   MGM  (Not Specified)   PGF  (Not Specified)   Neg Hx  (Not Specified)  No partnership data on file   Family History  Problem Relation Age of Onset   Pulmonary embolism Mother    Stroke Mother 72   Alcohol abuse Father    Suicidality Father    Heart failure Father    Alcoholism  Father    Diabetes Father    Heart attack Maternal Grandmother    Depression Paternal Grandfather    Breast cancer Neg Hx    Allergies  Allergen Reactions   Influenza Vaccines Anaphylaxis   Iodine Anaphylaxis   Tape Dermatitis    Patient's skin is very sensitive to Tegaderm dressings and tape- has been using paper tape when needed   Chantix [Varenicline Tartrate] Other (See Comments)    Mood changes/extreme made her want to kill herself   Lobster [Shellfish Allergy ] Itching   Penicillins Hives   Prozac  [Fluoxetine  Hcl]     Palpitations.   Sulfa Antibiotics Hives      Patient Care Team: Yani Coventry L, PA-C as PCP - General (Family Medicine)   Outpatient Medications Prior to Visit  Medication Sig   cyanocobalamin  (VITAMIN B12) 1000 MCG/ML injection ADMINISTER 1 ML(1000 MCG) IN THE MUSCLE EVERY 21 days.   ibuprofen  (ADVIL ) 800 MG tablet Take 1 tablet (800 mg total) by mouth 3 (three) times daily.   metaxalone  (SKELAXIN ) 800 MG tablet Take 1 tablet (800 mg total) by mouth 3 (three) times daily.   naproxen sodium (ANAPROX) 220 MG tablet Take 220 mg by mouth daily as needed (Pain).   NEEDLE, DISP, 25 G (B-D DISP NEEDLE 25GX1) 25G X 1 MISC Use with monthly B12 injections.   [DISCONTINUED] cyclobenzaprine  (FLEXERIL ) 10 MG tablet 1/2-1 tab p.o. nightly as needed.   [DISCONTINUED] diclofenac  Sodium (VOLTAREN ) 1 % GEL Apply 4 g topically 4 (four) times daily. To affected joint.   [DISCONTINUED] EPINEPHrine  0.3 mg/0.3 mL IJ SOAJ injection Inject 0.3 mg into the muscle as needed for anaphylaxis.   [DISCONTINUED] ipratropium (ATROVENT ) 0.06 % nasal spray PLACE 2 SPRAYS INTO BOTH NOSTRILS 4 TIMES DAILY.   [DISCONTINUED] LORazepam  (ATIVAN ) 0.5 MG tablet Take 1 tablet (0.5 mg total) by mouth 2 (two) times daily as needed. for anxiety   [DISCONTINUED] sertraline  (ZOLOFT ) 50 MG tablet Take 1 tablet (50 mg total) by mouth daily.   No facility-administered medications prior to visit.     Review of Systems  All other systems reviewed and are negative.        Objective:     BP 102/60   Pulse 73   Ht 5' 6 (1.676 m)   Wt 206 lb (93.4 kg)   SpO2 99%   BMI 33.25 kg/m  BP Readings from Last 3 Encounters:  02/05/24 102/60  11/18/23 115/79  09/06/23 94/66   Wt Readings from Last 3 Encounters:  02/05/24 206 lb (93.4 kg)  09/06/23 219 lb (99.3 kg)  04/30/23 210 lb (95.3 kg)      Physical Exam  BP 102/60   Pulse 73   Ht 5' 6 (1.676  m)   Wt 206 lb (93.4 kg)   SpO2 99%   BMI 33.25 kg/m   General Appearance:    Alert, cooperative, no distress, appears stated age  Head:    Normocephalic, without obvious abnormality, atraumatic  Eyes:    PERRL, conjunctiva/corneas clear, EOM's intact, fundi    benign, both eyes  Ears:    Normal TM's and external ear canals, both ears  Nose:   Nares normal, septum midline, mucosa normal, no drainage    or sinus tenderness  Throat:   Lips, mucosa, and tongue normal; teeth and gums normal  Neck:   Supple, symmetrical, trachea midline, no adenopathy;    thyroid :  no enlargement/tenderness/nodules; no carotid   bruit or JVD  Back:     Symmetric, no curvature, ROM normal, no CVA tenderness  Lungs:     Clear to auscultation bilaterally, respirations unlabored  Chest Wall:    No tenderness or deformity   Heart:    Regular rate and rhythm, S1 and S2 normal, no murmur, rub   or gallop     Abdomen:     Soft, non-tender, bowel sounds active all four quadrants,    no masses, no organomegaly        Extremities:   Extremities normal, atraumatic, no cyanosis or edema  Pulses:   2+ and symmetric all extremities  Skin:   Skin color, texture, turgor normal, no rashes or lesions  Lymph nodes:   Cervical, supraclavicular, and axillary nodes normal  Neurologic:   CNII-XII intact, normal strength, sensation and reflexes    throughout      Assessment & Plan:    Routine Health Maintenance and Physical Exam  Immunization History   Administered Date(s) Administered   DTaP 07/11/2013   Influenza Split 07/25/2012   Influenza-Unspecified 07/25/2012   Moderna Covid-19 Fall Seasonal Vaccine 9yrs & older 05/26/2022, 05/30/2022   Moderna Sars-Covid-2 Vaccination 06/06/2020   PFIZER(Purple Top)SARS-COV-2 Vaccination 10/15/2019, 11/09/2019   Tdap 07/11/2013, 12/10/2018   Zoster Recombinant(Shingrix ) 10/11/2022, 12/11/2022    Health Maintenance  Topic Date Due   Hepatitis B Vaccines (1 of 3 - 19+ 3-dose series) Never done   COVID-19 Vaccine (5 - 2024-25 season) 03/17/2023   INFLUENZA VACCINE  02/14/2024   MAMMOGRAM  09/09/2024   Colonoscopy  06/29/2025   Cervical Cancer Screening (HPV/Pap Cotest)  12/12/2025   DTaP/Tdap/Td (4 - Td or Tdap) 12/09/2028   Hepatitis C Screening  Completed   HIV Screening  Completed   Zoster Vaccines- Shingrix   Completed   HPV VACCINES  Aged Out   Meningococcal B Vaccine  Aged Out    Discussed health benefits of physical activity, and encouraged her to engage in regular exercise appropriate for her age and condition.  SABRASherrye Ast was seen today for annual exam.  Diagnoses and all orders for this visit:  Routine physical examination -     CBC with Differential/Platelet -     CMP14+EGFR -     Lipid panel -     TSH -     VITAMIN D  25 Hydroxy (Vit-D Deficiency, Fractures) -     Apolipoprotein B -     B12 and Folate Panel  Recurrent major depressive disorder, in full remission (HCC) -     sertraline  (ZOLOFT ) 25 MG tablet; Take 1 tablet (25 mg total) by mouth daily.  GAD (generalized anxiety disorder) -     LORazepam  (ATIVAN ) 0.5 MG tablet; Take 1 tablet (0.5 mg total) by mouth 2 (two) times  daily as needed. for anxiety -     sertraline  (ZOLOFT ) 25 MG tablet; Take 1 tablet (25 mg total) by mouth daily.  Vitamin D  insufficiency -     VITAMIN D  25 Hydroxy (Vit-D Deficiency, Fractures)  Vitamin B12 deficiency -     B12 and Folate Panel  Screening for diabetes mellitus -      CMP14+EGFR  Screening for lipid disorders -     Lipid panel -     Apolipoprotein B  Radiculitis of right cervical region -     LORazepam  (ATIVAN ) 0.5 MG tablet; Take 1 tablet (0.5 mg total) by mouth 2 (two) times daily as needed. for anxiety  Allergic reaction, subsequent encounter -     EPINEPHrine  0.3 mg/0.3 mL IJ SOAJ injection; Inject 0.3 mg into the muscle as needed for anaphylaxis.   .. Discussed 150 minutes of exercise a week.  Encouraged vitamin D  1000 units and Calcium 1300mg  or 4 servings of dairy a day.  PHQ/GAD no concerns Zoloft  decreased to 25mg  daily Ativan  refilled as needed Epipen  refilled for prn Fasting labs ordered Declined vaccines Follow up in 1 year.       Elide Stalzer, PA-C

## 2024-02-05 NOTE — Patient Instructions (Signed)

## 2024-02-06 DIAGNOSIS — M25562 Pain in left knee: Secondary | ICD-10-CM | POA: Diagnosis not present

## 2024-02-06 DIAGNOSIS — M542 Cervicalgia: Secondary | ICD-10-CM | POA: Diagnosis not present

## 2024-02-06 LAB — CMP14+EGFR
ALT: 19 IU/L (ref 0–32)
AST: 21 IU/L (ref 0–40)
Albumin: 3.9 g/dL (ref 3.8–4.9)
Alkaline Phosphatase: 59 IU/L (ref 44–121)
BUN/Creatinine Ratio: 18 (ref 9–23)
BUN: 12 mg/dL (ref 6–24)
Bilirubin Total: 0.2 mg/dL (ref 0.0–1.2)
CO2: 21 mmol/L (ref 20–29)
Calcium: 9.2 mg/dL (ref 8.7–10.2)
Chloride: 106 mmol/L (ref 96–106)
Creatinine, Ser: 0.65 mg/dL (ref 0.57–1.00)
Globulin, Total: 2.2 g/dL (ref 1.5–4.5)
Glucose: 91 mg/dL (ref 70–99)
Potassium: 4.3 mmol/L (ref 3.5–5.2)
Sodium: 143 mmol/L (ref 134–144)
Total Protein: 6.1 g/dL (ref 6.0–8.5)
eGFR: 107 mL/min/1.73 (ref >59)

## 2024-02-06 LAB — CBC WITH DIFFERENTIAL/PLATELET
Basophils Absolute: 0 x10E3/uL (ref 0.0–0.2)
Basos: 1 %
EOS (ABSOLUTE): 0.1 x10E3/uL (ref 0.0–0.4)
Eos: 2 %
Hematocrit: 42.2 % (ref 34.0–46.6)
Hemoglobin: 13.2 g/dL (ref 11.1–15.9)
Immature Grans (Abs): 0 x10E3/uL (ref 0.0–0.1)
Immature Granulocytes: 0 %
Lymphocytes Absolute: 1.6 x10E3/uL (ref 0.7–3.1)
Lymphs: 29 %
MCH: 30.8 pg (ref 26.6–33.0)
MCHC: 31.3 g/dL — ABNORMAL LOW (ref 31.5–35.7)
MCV: 98 fL — ABNORMAL HIGH (ref 79–97)
Monocytes Absolute: 0.5 x10E3/uL (ref 0.1–0.9)
Monocytes: 9 %
Neutrophils Absolute: 3.3 x10E3/uL (ref 1.4–7.0)
Neutrophils: 58 %
Platelets: 231 x10E3/uL (ref 150–450)
RBC: 4.29 x10E6/uL (ref 3.77–5.28)
RDW: 13.3 % (ref 11.7–15.4)
WBC: 5.6 x10E3/uL (ref 3.4–10.8)

## 2024-02-06 LAB — TSH: TSH: 3.1 u[IU]/mL (ref 0.450–4.500)

## 2024-02-06 LAB — LIPID PANEL
Chol/HDL Ratio: 2.7 ratio (ref 0.0–4.4)
Cholesterol, Total: 171 mg/dL (ref 100–199)
HDL: 64 mg/dL (ref >39)
LDL Chol Calc (NIH): 95 mg/dL (ref 0–99)
Triglycerides: 62 mg/dL (ref 0–149)
VLDL Cholesterol Cal: 12 mg/dL (ref 5–40)

## 2024-02-06 LAB — B12 AND FOLATE PANEL
Folate: 18.3 ng/mL (ref >3.0)
Vitamin B-12: 584 pg/mL (ref 232–1245)

## 2024-02-06 LAB — VITAMIN D 25 HYDROXY (VIT D DEFICIENCY, FRACTURES): Vit D, 25-Hydroxy: 54.6 ng/mL (ref 30.0–100.0)

## 2024-02-06 LAB — APOLIPOPROTEIN B: Apolipoprotein B: 80 mg/dL (ref <90)

## 2024-02-07 ENCOUNTER — Ambulatory Visit: Payer: Self-pay | Admitting: Physician Assistant

## 2024-02-07 DIAGNOSIS — E538 Deficiency of other specified B group vitamins: Secondary | ICD-10-CM

## 2024-02-07 NOTE — Progress Notes (Signed)
 Joanna Baker,   Cholesterol looks much better. Great job.  APOB is in the desired range.  B12 and folate look Great. I would stay on the same B12 regimen. Do you need refills?  Kidney function improved! Thyroid  in normal range but up from last check.  Vitamin D  looks GREAT.

## 2024-02-10 ENCOUNTER — Encounter: Payer: Self-pay | Admitting: Physician Assistant

## 2024-02-17 ENCOUNTER — Ambulatory Visit

## 2024-02-17 ENCOUNTER — Ambulatory Visit
Admission: RE | Admit: 2024-02-17 | Discharge: 2024-02-17 | Disposition: A | Discharge status: Home / Self Care | Attending: Family Medicine | Admitting: Family Medicine

## 2024-02-17 VITALS — BP 110/75 | HR 85 | Temp 97.9°F | Resp 16

## 2024-02-17 DIAGNOSIS — R0781 Pleurodynia: Secondary | ICD-10-CM

## 2024-02-17 DIAGNOSIS — S20212A Contusion of left front wall of thorax, initial encounter: Secondary | ICD-10-CM | POA: Diagnosis not present

## 2024-02-17 DIAGNOSIS — W19XXXA Unspecified fall, initial encounter: Secondary | ICD-10-CM

## 2024-02-17 DIAGNOSIS — Z043 Encounter for examination and observation following other accident: Secondary | ICD-10-CM | POA: Diagnosis not present

## 2024-02-17 NOTE — ED Triage Notes (Signed)
 [facility] to [facility] with [facility] fall in shower last night hitting chest on left side concerned for a cracked ri. Pt reports no otc pain meds

## 2024-02-17 NOTE — Discharge Instructions (Addendum)
 Advised patient of chest x-ray results with hardcopy and images provided.  Advised patient may take previously prescribed Ibuprofen /Advil  800 mg tablet 1-3 times daily.  Encouraged to increase daily water intake to 64 ounces per day while taking this medication.  Advised if symptoms worsen and/or unresolved please follow-up with your PCP or here for further evaluation.

## 2024-02-17 NOTE — ED Provider Notes (Signed)
 [facility]    CSN: [id number] Arrival date & time: 02/17/24  0856      History   Chief Complaint Chief Complaint  Patient presents with   Fall    Left side rib injury     HPI Joanna Baker is a 53 y.o. female.   HPI pleasant 53 year old female presents with left chest rib pain secondary to fall last night in claw tub.  Patient is concerned with possible left-sided rib fractures.  PMH significant for IBS, anxiety, and rotator cuff tendinitis.  Past Medical History:  Diagnosis Date   ACL injury tear    Right knee, not repaired.     Anxiety    Asthma    Depression    Fatty liver    IBS (irritable bowel syndrome)    TB (tuberculosis), treated     6 month of INH at age 39    Patient Active Problem List   Diagnosis Date Noted   Internal derangement of left knee 01/29/2024   Greater trochanteric bursitis, left 01/29/2024   Rotator cuff tendonitis 06/10/2023   Right supraspinatus tendonitis 06/10/2023   Tendinitis of right infraspinatus tendon 06/10/2023   Dental erosion 03/06/2023   Right foot pain 03/06/2023   Fatty liver, alcoholic 10/03/2022   Arthritis of finger 10/03/2022   Arthralgia 05/02/2022   Medial epicondylitis, left 01/11/2022   Atrial septal defect    Hypotension 06/19/2021   Trouble in sleeping 01/29/2021   Atypical chest pain 01/23/2021   Radiculitis of right cervical region 11/30/2020   Wheezing 11/21/2020   Chronic right shoulder pain 01/06/2020   Cough 01/06/2020   Environmental allergies 01/06/2020   Seasonal allergies 01/06/2020   BPPV (benign paroxysmal positional vertigo), left 12/12/2018   Bone pain 12/12/2018   Hand eczema 05/16/2018   Pain of right thumb 05/16/2018   Pain of right heel 05/16/2018   Multiple joint pain 05/16/2018   Lymphadenopathy 05/16/2018   Right peroneal tendinosis 02/26/2018   PVC's (premature ventricular contractions) 10/27/2017   Atypical ductal hyperplasia of left breast 08/05/2017   Breast  calcification, left 07/26/2017   SOB (shortness of breath) 12/24/2016   Palpitations 12/24/2016   Vitamin B12 deficiency 12/24/2016   Right ankle swelling 12/24/2016   Family history of CHF (congestive heart failure) 12/24/2016   Pernicious anemia 05/22/2016   Vitamin D  insufficiency 05/22/2016   DUB (dysfunctional uterine bleeding) 05/21/2016   Right-sided chest pain 05/18/2016   Radicular syndrome of right leg 05/18/2016   Pulsatile tinnitus of left ear 05/18/2016   Mass of mandible 09/02/2015   Right upper quadrant abdominal pain 09/02/2015   ACL tear 07/19/2015   Premenstrual symptom 11/07/2014   Depression 12/11/2013   GAD (generalized anxiety disorder) 12/11/2013   Fatty liver disease, nonalcoholic 11/02/2013   Right lumbar radiculitis 01/13/2013   Hemorrhoid 08/03/2011    Past Surgical History:  Procedure Laterality Date   COLONOSCOPY     excision cells breast Left    TEE WITHOUT CARDIOVERSION N/A 08/15/2021   Procedure: TRANSESOPHAGEAL ECHOCARDIOGRAM (TEE);  Surgeon: Francyne Headland, MD;  Location: [facility] ENDOSCOPY;  Service: Cardiovascular;  Laterality: N/A;   TONSILLECTOMY  07/17/1991    OB History   No obstetric history on file.      Home Medications    Prior to Admission medications   Medication Sig Start Date End Date Taking? Authorizing Provider  cyanocobalamin  (VITAMIN B12) 1000 MCG/ML injection ADMINISTER 1 ML(1000 MCG) IN THE MUSCLE EVERY 21 days. 09/06/23   Breeback, Jade L,  PA-C  EPINEPHrine  0.3 mg/0.3 mL IJ SOAJ injection Inject 0.3 mg into the muscle as needed for anaphylaxis. 02/05/24   Breeback, Jade L, PA-C  ibuprofen  (ADVIL ) 800 MG tablet Take 1 tablet (800 mg total) by mouth 3 (three) times daily. 11/18/23   Maranda Jamee Jacob, MD  LORazepam  (ATIVAN ) 0.5 MG tablet Take 1 tablet (0.5 mg total) by mouth 2 (two) times daily as needed. for anxiety 02/05/24   Breeback, Jade L, PA-C  metaxalone  (SKELAXIN ) 800 MG tablet Take 1 tablet (800 mg total) by mouth 3  (three) times daily. 11/18/23   Maranda Jamee Jacob, MD  naproxen sodium (ANAPROX) 220 MG tablet Take 220 mg by mouth daily as needed (Pain).    [provider]  NEEDLE, DISP, 25 G (B-D DISP NEEDLE 25GX1) 25G X 1 MISC Use with monthly B12 injections. 05/02/22   Breeback, Jade L, PA-C  sertraline  (ZOLOFT ) 25 MG tablet Take 1 tablet (25 mg total) by mouth daily. 02/05/24   Antoniette Vermell CROME, PA-C    Family History Family History  Problem Relation Age of Onset   Pulmonary embolism Mother    Stroke Mother 48   Alcohol abuse Father    Suicidality Father    Heart failure Father    Alcoholism Father    Diabetes Father    Heart attack Maternal Grandmother    Depression Paternal Grandfather    Breast cancer Neg Hx     Social History Social History   Tobacco Use   Smoking status: Former    Current packs/day: 0.25    Average packs/day: 0.3 packs/day for 5.0 years (1.3 ttl pk-yrs)    Types: Cigarettes   Smokeless tobacco: Current   Tobacco comments:    Pt uses nicotine  lozenges.   Vaping Use   Vaping status: Never Used  Substance Use Topics   Alcohol use: Yes    Alcohol/week: 2.0 standard drinks of alcohol    Types: 2 Cans of beer per week    Comment: Occasional   Drug use: Not Currently    Types: Marijuana    Comment: history of use, not current use     Allergies   Influenza vaccines, Iodine, Tape, Chantix [varenicline tartrate], Lobster [shellfish allergy ], Penicillins, Prozac  [fluoxetine  hcl], and Sulfa antibiotics   Review of Systems Review of Systems   Physical Exam Triage Vital Signs ED Triage Vitals [02/17/24 0903]  Encounter Vitals Group     BP      Girls Systolic BP Percentile      Girls Diastolic BP Percentile      Boys Systolic BP Percentile      Boys Diastolic BP Percentile      Pulse      Resp      Temp      Temp src      SpO2      Weight      Height      Head Circumference      Peak Flow      Pain Score 10     Pain Loc      Pain  Education      Exclude from Growth Chart    No data found.  Updated Vital Signs BP 110/75   Pulse 85   Temp 97.9 F (36.6 C)   Resp 16   SpO2 98%   Visual Acuity Right Eye Distance:   Left Eye Distance:   Bilateral Distance:    Right Eye Near:   Left Eye  Near:    Bilateral Near:     Physical Exam Vitals and nursing note reviewed.  Constitutional:      Appearance: Normal appearance. She is normal weight.  HENT:     Head: Normocephalic and atraumatic.     Mouth/Throat:     Mouth: Mucous membranes are moist.     Pharynx: Oropharynx is clear.  Eyes:     Extraocular Movements: Extraocular movements intact.     Pupils: Pupils are equal, round, and reactive to light.  Cardiovascular:     Rate and Rhythm: Normal rate and regular rhythm.     Pulses: Normal pulses.     Heart sounds: Normal heart sounds.  Pulmonary:     Effort: Pulmonary effort is normal.     Breath sounds: Normal breath sounds. No wheezing, rhonchi or rales.  Musculoskeletal:        General: Normal range of motion.     Comments: Left-sided rib (anterior lateral aspect): TTP over 6th-9th ribs  Skin:    General: Skin is warm and dry.  Neurological:     General: No focal deficit present.     Mental Status: She is alert and oriented to person, place, and time.  Psychiatric:        Mood and Affect: Mood normal.        Behavior: Behavior normal.        Thought Content: Thought content normal.      UC Treatments / Results  Labs (all labs ordered are listed, but only abnormal results are displayed) Labs Reviewed - No data to display  EKG   Radiology DG Ribs Unilateral W/Chest Left Result Date: 02/17/2024 CLINICAL DATA:  Fall onto left ribs EXAM: LEFT RIBS AND CHEST - 3+ VIEW COMPARISON:  September 30, 2017 1 keep FINDINGS: No focal airspace consolidation, pleural effusion, or pneumothorax. No cardiomegaly. No acute, displaced rib fracture or destructive lesion. IMPRESSION: No acute, displaced rib fracture.  Otherwise, clear lungs. Electronically Signed   By: Rogelia Myers M.D.   On: 02/17/2024 09:54    Procedures Procedures (including critical care time)  Medications Ordered in UC Medications - No data to display  Initial Impression / Assessment and Plan / UC Course  I have reviewed the triage vital signs and the nursing notes.  Pertinent labs & imaging results that were available during my care of the patient were reviewed by me and considered in my medical decision making (see chart for details).     MDM: 1.  Rib pain on left side-DG ribs unilateral with chest left revealed above; 2.  Fall, initial encounter-per patient while in claw foot bathtub falling and hitting left side of rib cage; 3.  Rib contusion, left, initial encounter-Advised patient of chest x-ray results with hardcopy and images provided.  Advised patient may take previously prescribed Ibuprofen /Advil  800 mg tablet 1-3 times daily.  Encouraged to increase daily water intake to 64 ounces per day while taking this medication.  Advised if symptoms worsen and/or unresolved please follow-up with your PCP or here for further evaluation.  Patient discharged home, hemodynamically stable.  Final Clinical Impressions(s) / UC Diagnoses   Final diagnoses:  Rib pain on left side  Fall, initial encounter  Rib contusion, left, initial encounter     Discharge Instructions      Advised patient of chest x-ray results with hardcopy and images provided.  Advised patient may take previously prescribed Ibuprofen /Advil  800 mg tablet 1-3 times daily.  Encouraged to increase daily water  intake to 64 ounces per day while taking this medication.  Advised if symptoms worsen and/or unresolved please follow-up with your PCP or here for further evaluation.     ED Prescriptions   None    I have reviewed the PDMP during this encounter.   Teddy Sharper, FNP 02/17/24 1021

## 2024-02-21 ENCOUNTER — Other Ambulatory Visit (HOSPITAL_COMMUNITY)

## 2024-02-27 DIAGNOSIS — M25562 Pain in left knee: Secondary | ICD-10-CM | POA: Diagnosis not present

## 2024-02-27 DIAGNOSIS — M542 Cervicalgia: Secondary | ICD-10-CM | POA: Diagnosis not present

## 2024-03-02 MED ORDER — IBUPROFEN 800 MG PO TABS: 800.0000 mg | ORAL_TABLET | Freq: Three times a day (TID) | ORAL | 1 refills | Status: AC

## 2024-03-02 MED ORDER — CYANOCOBALAMIN 1000 MCG/ML IJ SOLN: INTRAMUSCULAR | 3 refills | Status: AC

## 2024-03-06 DIAGNOSIS — M542 Cervicalgia: Secondary | ICD-10-CM | POA: Diagnosis not present

## 2024-03-06 DIAGNOSIS — M25562 Pain in left knee: Secondary | ICD-10-CM | POA: Diagnosis not present

## 2024-03-11 ENCOUNTER — Ambulatory Visit: Admitting: Sports Medicine

## 2024-03-17 ENCOUNTER — Encounter: Payer: Self-pay | Admitting: Sports Medicine

## 2024-03-20 DIAGNOSIS — M25562 Pain in left knee: Secondary | ICD-10-CM | POA: Diagnosis not present

## 2024-03-20 DIAGNOSIS — M542 Cervicalgia: Secondary | ICD-10-CM | POA: Diagnosis not present

## 2024-04-02 DIAGNOSIS — F4323 Adjustment disorder with mixed anxiety and depressed mood: Secondary | ICD-10-CM | POA: Diagnosis not present

## 2024-04-03 DIAGNOSIS — M542 Cervicalgia: Secondary | ICD-10-CM | POA: Diagnosis not present

## 2024-04-03 DIAGNOSIS — M25562 Pain in left knee: Secondary | ICD-10-CM | POA: Diagnosis not present

## 2024-04-10 ENCOUNTER — Ambulatory Visit: Admitting: Sports Medicine

## 2024-04-10 DIAGNOSIS — M25562 Pain in left knee: Secondary | ICD-10-CM | POA: Diagnosis not present

## 2024-04-10 DIAGNOSIS — M542 Cervicalgia: Secondary | ICD-10-CM | POA: Diagnosis not present

## 2024-04-24 DIAGNOSIS — M542 Cervicalgia: Secondary | ICD-10-CM | POA: Diagnosis not present

## 2024-04-24 DIAGNOSIS — M25562 Pain in left knee: Secondary | ICD-10-CM | POA: Diagnosis not present

## 2024-05-06 ENCOUNTER — Other Ambulatory Visit: Payer: Self-pay | Admitting: Medical Genetics

## 2024-05-06 DIAGNOSIS — Z006 Encounter for examination for normal comparison and control in clinical research program: Secondary | ICD-10-CM

## 2024-05-08 DIAGNOSIS — M25562 Pain in left knee: Secondary | ICD-10-CM | POA: Diagnosis not present

## 2024-05-08 DIAGNOSIS — M542 Cervicalgia: Secondary | ICD-10-CM | POA: Diagnosis not present

## 2024-05-13 DIAGNOSIS — M25562 Pain in left knee: Secondary | ICD-10-CM | POA: Diagnosis not present

## 2024-05-13 DIAGNOSIS — M542 Cervicalgia: Secondary | ICD-10-CM | POA: Diagnosis not present

## 2024-05-14 DIAGNOSIS — F4323 Adjustment disorder with mixed anxiety and depressed mood: Secondary | ICD-10-CM | POA: Diagnosis not present

## 2024-05-20 DIAGNOSIS — M25562 Pain in left knee: Secondary | ICD-10-CM | POA: Diagnosis not present

## 2024-05-20 DIAGNOSIS — M542 Cervicalgia: Secondary | ICD-10-CM | POA: Diagnosis not present

## 2024-05-27 DIAGNOSIS — M25562 Pain in left knee: Secondary | ICD-10-CM | POA: Diagnosis not present

## 2024-06-05 DIAGNOSIS — M542 Cervicalgia: Secondary | ICD-10-CM | POA: Diagnosis not present

## 2024-06-05 DIAGNOSIS — M25562 Pain in left knee: Secondary | ICD-10-CM | POA: Diagnosis not present

## 2024-06-05 NOTE — Progress Notes (Deleted)
 HPI: FU ASD.  Monitor June 2018 showed sinus rhythm with occasional PAC and PVC.  Exercise treadmill August 2022 showed no ST changes but she failed to reach target heart rate (75%).  Cardiac CTA November 2022 showed calcium score 0 with no evidence of coronary disease.  There was note of a secundum atrial septal defect with normal pulmonary vein drainage into the left atrium.  There was moderate right ventricular enlargement and mild right atrial enlargement.  Cardiac MRI December 2022 showed normal LV size with ejection fraction 51%, normal RV size with normal function, borderline right atrial dilatation and ASD with QP/QS 1.08.  TEE January 2023 showed normal LV function, small secundum ASD with left-to-right shunting (shunt fraction 1.07:1).  Patient seen by Dr. Wonda and medical therapy with follow-up echocardiograms recommended (ASD is small with low QP/QS).  Echocardiogram October 2024 showed normal LV function, mobile anterior atrial septum but no ASD visualized.  Snce last seen   Current Outpatient Medications  Medication Sig Dispense Refill   cyanocobalamin  (VITAMIN B12) 1000 MCG/ML injection ADMINISTER 1 ML(1000 MCG) IN THE MUSCLE EVERY 21 days. 4 mL 3   EPINEPHrine  0.3 mg/0.3 mL IJ SOAJ injection Inject 0.3 mg into the muscle as needed for anaphylaxis. 1 each 1   ibuprofen  (ADVIL ) 800 MG tablet Take 1 tablet (800 mg total) by mouth 3 (three) times daily. 90 tablet 1   LORazepam  (ATIVAN ) 0.5 MG tablet Take 1 tablet (0.5 mg total) by mouth 2 (two) times daily as needed. for anxiety 15 tablet 0   metaxalone  (SKELAXIN ) 800 MG tablet Take 1 tablet (800 mg total) by mouth 3 (three) times daily. 21 tablet 0   naproxen sodium (ANAPROX) 220 MG tablet Take 220 mg by mouth daily as needed (Pain).     NEEDLE, DISP, 25 G (B-D DISP NEEDLE 25GX1) 25G X 1 MISC Use with monthly B12 injections. 10 each 1   sertraline  (ZOLOFT ) 25 MG tablet Take 1 tablet (25 mg total) by mouth daily. 90 tablet 4   No  current facility-administered medications for this visit.     Past Medical History:  Diagnosis Date   ACL injury tear    Right knee, not repaired.     Anxiety    Asthma    Depression    Fatty liver    IBS (irritable bowel syndrome)    TB (tuberculosis), treated     6 month of INH at age 72    Past Surgical History:  Procedure Laterality Date   COLONOSCOPY     excision cells breast Left    TEE WITHOUT CARDIOVERSION N/A 08/15/2021   Procedure: TRANSESOPHAGEAL ECHOCARDIOGRAM (TEE);  Surgeon: Francyne Headland, MD;  Location: [facility]COPY;  Service: Cardiovascular;  Laterality: N/A;   TONSILLECTOMY  07/17/1991    Social History   Socioeconomic History   Marital status: Single    Spouse name: Not on file   Number of children: Not on file   Years of education: Not on file   Highest education level: Bachelor's degree (e.g., BA, AB, BS)  Occupational History   Not on file  Tobacco Use   Smoking status: Former    Current packs/day: 0.25    Average packs/day: 0.3 packs/day for 5.0 years (1.3 ttl pk-yrs)    Types: Cigarettes   Smokeless tobacco: Current   Tobacco comments:    Pt uses nicotine  lozenges.   Vaping Use   Vaping status: Never Used  Substance and Sexual Activity  Alcohol use: Yes    Alcohol/week: 2.0 standard drinks of alcohol    Types: 2 Cans of beer per week    Comment: Occasional   Drug use: Not Currently    Types: Marijuana    Comment: history of use, not current use   Sexual activity: Not on file  Other Topics Concern   Not on file  Social History Narrative   Not on file   Social Drivers of Health   Financial Resource Strain: Low Risk  (12/30/2023)   Overall Financial Resource Strain (CARDIA)    Difficulty of Paying Living Expenses: Not hard at all  Food Insecurity: No Food Insecurity (12/30/2023)   Hunger Vital Sign    Worried About Running Out of Food in the Last Year: Never true    Ran Out of Food in the Last Year: Never true  Transportation  Needs: No Transportation Needs (12/30/2023)   PRAPARE - Administrator, Civil Service (Medical): No    Lack of Transportation (Non-Medical): No  Physical Activity: Insufficiently Active (12/30/2023)   Exercise Vital Sign    Days of Exercise per Week: 2 days    Minutes of Exercise per Session: 20 min  Stress: No Stress Concern Present (12/30/2023)   [employer] of Occupational Health - Occupational Stress Questionnaire    Feeling of Stress: Only a little  Social Connections: Socially Isolated (12/30/2023)   Social Connection and Isolation Panel    Frequency of Communication with Friends and Family: More than three times a week    Frequency of Social Gatherings with Friends and Family: Once a week    Attends Religious Services: Never    Database Administrator or Organizations: No    Attends Engineer, Structural: Not on file    Marital Status: Divorced  Catering Manager Violence: Not on file    Family History  Problem Relation Age of Onset   Pulmonary embolism Mother    Stroke Mother 35   Alcohol abuse Father    Suicidality Father    Heart failure Father    Alcoholism Father    Diabetes Father    Heart attack Maternal Grandmother    Depression Paternal Grandfather    Breast cancer Neg Hx     ROS: no fevers or chills, productive cough, hemoptysis, dysphasia, odynophagia, melena, hematochezia, dysuria, hematuria, rash, seizure activity, orthopnea, PND, pedal edema, claudication. Remaining systems are negative.  Physical Exam: Well-developed well-nourished in no acute distress.  Skin is warm and dry.  HEENT is normal.  Neck is supple.  Chest is clear to auscultation with normal expansion.  Cardiovascular exam is regular rate and rhythm.  Abdominal exam nontender or distended. No masses palpated. Extremities show no edema. neuro grossly intact  ECG- personally reviewed  A/P  1 atrial septal defect-patient previously seen by Dr. Wonda and medical  therapy recommended.  Defect small on previous MRI and TEE.  Will repeat echocardiogram.  No history of RV dysfunction or enlargement.  2 prior chest pain-previous CTA showed no coronary disease and no recurrences.  3 history of anxiety-managed by primary care.  Redell Shallow, MD

## 2024-06-17 DIAGNOSIS — M25562 Pain in left knee: Secondary | ICD-10-CM | POA: Diagnosis not present

## 2024-06-17 DIAGNOSIS — M542 Cervicalgia: Secondary | ICD-10-CM | POA: Diagnosis not present

## 2024-06-18 DIAGNOSIS — F4323 Adjustment disorder with mixed anxiety and depressed mood: Secondary | ICD-10-CM | POA: Diagnosis not present

## 2024-06-19 ENCOUNTER — Other Ambulatory Visit: Payer: Self-pay | Admitting: Physician Assistant

## 2024-06-19 ENCOUNTER — Ambulatory Visit: Admitting: Physician Assistant

## 2024-06-19 ENCOUNTER — Ambulatory Visit: Admitting: Cardiology

## 2024-06-19 DIAGNOSIS — F411 Generalized anxiety disorder: Secondary | ICD-10-CM

## 2024-06-19 DIAGNOSIS — M5412 Radiculopathy, cervical region: Secondary | ICD-10-CM

## 2024-06-24 DIAGNOSIS — M25562 Pain in left knee: Secondary | ICD-10-CM | POA: Diagnosis not present

## 2024-06-24 DIAGNOSIS — M542 Cervicalgia: Secondary | ICD-10-CM | POA: Diagnosis not present

## 2024-06-24 NOTE — Telephone Encounter (Signed)
 LORAZEPAM  Last OV: 02/05/24 Next OV: 07/01/24 Last RF: 02/05/24

## 2024-07-01 ENCOUNTER — Ambulatory Visit: Admitting: Physician Assistant

## 2024-07-01 ENCOUNTER — Encounter: Payer: Self-pay | Admitting: Physician Assistant

## 2024-07-01 VITALS — BP 107/61 | HR 90 | Ht 66.0 in | Wt 217.0 lb

## 2024-07-01 DIAGNOSIS — M25562 Pain in left knee: Secondary | ICD-10-CM | POA: Diagnosis not present

## 2024-07-01 DIAGNOSIS — M25511 Pain in right shoulder: Secondary | ICD-10-CM

## 2024-07-01 DIAGNOSIS — Z79899 Other long term (current) drug therapy: Secondary | ICD-10-CM | POA: Diagnosis not present

## 2024-07-01 DIAGNOSIS — M5030 Other cervical disc degeneration, unspecified cervical region: Secondary | ICD-10-CM

## 2024-07-01 DIAGNOSIS — M5412 Radiculopathy, cervical region: Secondary | ICD-10-CM | POA: Diagnosis not present

## 2024-07-01 DIAGNOSIS — G8929 Other chronic pain: Secondary | ICD-10-CM | POA: Diagnosis not present

## 2024-07-01 DIAGNOSIS — M542 Cervicalgia: Secondary | ICD-10-CM

## 2024-07-01 DIAGNOSIS — F411 Generalized anxiety disorder: Secondary | ICD-10-CM | POA: Diagnosis not present

## 2024-07-01 MED ORDER — LORAZEPAM 0.5 MG PO TABS: 0.5000 mg | ORAL_TABLET | Freq: Three times a day (TID) | ORAL | 1 refills | Status: AC | PRN

## 2024-07-01 MED ORDER — PREGABALIN 25 MG PO CAPS: 25.0000 mg | ORAL_CAPSULE | Freq: Two times a day (BID) | ORAL | 0 refills | Status: AC

## 2024-07-01 NOTE — Progress Notes (Unsigned)
° °  Established Patient Office Visit  Subjective   Patient ID: Joanna Baker, female    DOB: 02/14/1971  Age: 53 y.o. MRN: [id number]  Chief Complaint  Patient presents with   Neck Pain    HPI  {History (Optional):23778}  ROS    Objective:     BP 107/61   Pulse 90   Ht 5' 6 (1.676 m)   Wt 217 lb (98.4 kg)   SpO2 99%   BMI 35.02 kg/m  {Vitals History (Optional):23777}  Physical Exam   No results found for any visits on 07/01/24.  {Labs (Optional):23779}  The 10-year ASCVD risk score (Arnett DK, et al., 2019) is: 0.7%    Assessment & Plan:   Problem List Items Addressed This Visit       Unprioritized   Chronic right shoulder pain - Primary    No follow-ups on file.    Sylvanna Burggraf, PA-C

## 2024-07-01 NOTE — Patient Instructions (Signed)
 Start lyrica  25mg  twice a day.  Will make referral to orthopedic.  1/2 zoloft  for 4-6 weeks then stop.

## 2024-07-02 ENCOUNTER — Ambulatory Visit: Payer: Self-pay | Admitting: Physician Assistant

## 2024-07-02 DIAGNOSIS — R748 Abnormal levels of other serum enzymes: Secondary | ICD-10-CM

## 2024-07-02 LAB — CMP14+EGFR
ALT: 46 IU/L — ABNORMAL HIGH (ref 0–32)
AST: 38 IU/L (ref 0–40)
Albumin: 4.5 g/dL (ref 3.8–4.9)
Alkaline Phosphatase: 69 IU/L (ref 49–135)
BUN/Creatinine Ratio: 17 (ref 9–23)
BUN: 13 mg/dL (ref 6–24)
Bilirubin Total: 0.5 mg/dL (ref 0.0–1.2)
CO2: 25 mmol/L (ref 20–29)
Calcium: 9.8 mg/dL (ref 8.7–10.2)
Chloride: 102 mmol/L (ref 96–106)
Creatinine, Ser: 0.76 mg/dL (ref 0.57–1.00)
Globulin, Total: 2.5 g/dL (ref 1.5–4.5)
Glucose: 87 mg/dL (ref 70–99)
Potassium: 4.5 mmol/L (ref 3.5–5.2)
Sodium: 141 mmol/L (ref 134–144)
Total Protein: 7 g/dL (ref 6.0–8.5)
eGFR: 95 mL/min/1.73 (ref >59)

## 2024-07-02 NOTE — Progress Notes (Signed)
 Joanna Baker,   Kidney function and glucose look good.  ALT, liver enzyme, up just a little. Avoid alcohol and tylenol productions for 2 weeks and then recheck level.

## 2024-07-03 ENCOUNTER — Encounter: Payer: Self-pay | Admitting: Physician Assistant

## 2024-07-03 ENCOUNTER — Other Ambulatory Visit (HOSPITAL_COMMUNITY): Payer: Self-pay

## 2024-07-03 DIAGNOSIS — M5030 Other cervical disc degeneration, unspecified cervical region: Secondary | ICD-10-CM | POA: Insufficient documentation

## 2024-07-06 NOTE — Telephone Encounter (Signed)
 Can we call patient and see if pharmacy will allow her to use good rx card? Should be affordable using that?

## 2024-07-15 DIAGNOSIS — M25562 Pain in left knee: Secondary | ICD-10-CM | POA: Diagnosis not present

## 2024-07-15 DIAGNOSIS — M542 Cervicalgia: Secondary | ICD-10-CM | POA: Diagnosis not present

## 2024-07-18 LAB — CMP14+EGFR
ALT: 27 IU/L (ref 0–32)
AST: 22 IU/L (ref 0–40)
Albumin: 4.2 g/dL (ref 3.8–4.9)
Alkaline Phosphatase: 63 IU/L (ref 49–135)
BUN/Creatinine Ratio: 14 (ref 9–23)
BUN: 10 mg/dL (ref 6–24)
Bilirubin Total: 0.3 mg/dL (ref 0.0–1.2)
CO2: 25 mmol/L (ref 20–29)
Calcium: 9.3 mg/dL (ref 8.7–10.2)
Chloride: 103 mmol/L (ref 96–106)
Creatinine, Ser: 0.71 mg/dL (ref 0.57–1.00)
Globulin, Total: 2 g/dL (ref 1.5–4.5)
Glucose: 87 mg/dL (ref 70–99)
Potassium: 4.2 mmol/L (ref 3.5–5.2)
Sodium: 142 mmol/L (ref 134–144)
Total Protein: 6.2 g/dL (ref 6.0–8.5)
eGFR: 103 mL/min/1.73

## 2024-07-20 NOTE — Progress Notes (Signed)
 Joanna Baker,   ALT back in normal range! GREAT news.

## 2024-07-27 ENCOUNTER — Encounter: Payer: Self-pay | Admitting: Physician Assistant

## 2024-08-26 ENCOUNTER — Ambulatory Visit: Admitting: Cardiology

## 2024-09-07 ENCOUNTER — Encounter: Payer: BC Managed Care – PPO | Admitting: Physician Assistant

## 2024-09-30 ENCOUNTER — Ambulatory Visit: Admitting: Physician Assistant

## 2025-02-05 ENCOUNTER — Encounter: Admitting: Physician Assistant
